# Patient Record
Sex: Female | Born: 1975 | Race: White | Hispanic: No | Smoking: Former smoker
Health system: Southern US, Community
[De-identification: ages and names within clinical notes are randomized; demographics above are authoritative.]

## PROBLEM LIST (undated history)

## (undated) DIAGNOSIS — J45909 Unspecified asthma, uncomplicated: Secondary | ICD-10-CM

## (undated) DIAGNOSIS — E559 Vitamin D deficiency, unspecified: Secondary | ICD-10-CM

## (undated) DIAGNOSIS — R5383 Other fatigue: Secondary | ICD-10-CM

## (undated) DIAGNOSIS — T7840XA Allergy, unspecified, initial encounter: Secondary | ICD-10-CM

## (undated) DIAGNOSIS — B977 Papillomavirus as the cause of diseases classified elsewhere: Secondary | ICD-10-CM

## (undated) DIAGNOSIS — R87629 Unspecified abnormal cytological findings in specimens from vagina: Secondary | ICD-10-CM

## (undated) DIAGNOSIS — K589 Irritable bowel syndrome without diarrhea: Secondary | ICD-10-CM

## (undated) DIAGNOSIS — F32A Depression, unspecified: Secondary | ICD-10-CM

## (undated) DIAGNOSIS — K219 Gastro-esophageal reflux disease without esophagitis: Secondary | ICD-10-CM

## (undated) DIAGNOSIS — F419 Anxiety disorder, unspecified: Secondary | ICD-10-CM

## (undated) DIAGNOSIS — G4733 Obstructive sleep apnea (adult) (pediatric): Secondary | ICD-10-CM

## (undated) DIAGNOSIS — E079 Disorder of thyroid, unspecified: Secondary | ICD-10-CM

## (undated) DIAGNOSIS — F3281 Premenstrual dysphoric disorder: Secondary | ICD-10-CM

## (undated) DIAGNOSIS — T1490XA Injury, unspecified, initial encounter: Secondary | ICD-10-CM

## (undated) DIAGNOSIS — B009 Herpesviral infection, unspecified: Secondary | ICD-10-CM

## (undated) DIAGNOSIS — E669 Obesity, unspecified: Secondary | ICD-10-CM

## (undated) DIAGNOSIS — M1990 Unspecified osteoarthritis, unspecified site: Secondary | ICD-10-CM

## (undated) DIAGNOSIS — N63 Unspecified lump in unspecified breast: Secondary | ICD-10-CM

## (undated) DIAGNOSIS — F329 Major depressive disorder, single episode, unspecified: Secondary | ICD-10-CM

## (undated) HISTORY — DX: Anxiety disorder, unspecified: F41.9

## (undated) HISTORY — DX: Major depressive disorder, single episode, unspecified: F32.9

## (undated) HISTORY — DX: Obstructive sleep apnea (adult) (pediatric): G47.33

## (undated) HISTORY — DX: Injury, unspecified, initial encounter: T14.90XA

## (undated) HISTORY — DX: Obesity, unspecified: E66.9

## (undated) HISTORY — DX: Depression, unspecified: F32.A

## (undated) HISTORY — DX: Papillomavirus as the cause of diseases classified elsewhere: B97.7

## (undated) HISTORY — DX: Vitamin D deficiency, unspecified: E55.9

## (undated) HISTORY — PX: WISDOM TOOTH EXTRACTION: SHX21

## (undated) HISTORY — DX: Unspecified asthma, uncomplicated: J45.909

## (undated) HISTORY — PX: CHOLECYSTECTOMY: SHX55

## (undated) HISTORY — DX: Unspecified osteoarthritis, unspecified site: M19.90

## (undated) HISTORY — PX: TONSILECTOMY/ADENOIDECTOMY WITH MYRINGOTOMY: SHX6125

## (undated) HISTORY — DX: Irritable bowel syndrome, unspecified: K58.9

## (undated) HISTORY — DX: Herpesviral infection, unspecified: B00.9

## (undated) HISTORY — DX: Other fatigue: R53.83

## (undated) HISTORY — DX: Unspecified lump in unspecified breast: N63.0

## (undated) HISTORY — DX: Disorder of thyroid, unspecified: E07.9

## (undated) HISTORY — DX: Allergy, unspecified, initial encounter: T78.40XA

## (undated) HISTORY — DX: Unspecified abnormal cytological findings in specimens from vagina: R87.629

---

## 2007-03-29 ENCOUNTER — Encounter (INDEPENDENT_AMBULATORY_CARE_PROVIDER_SITE_OTHER): Payer: Self-pay | Admitting: General Surgery

## 2007-03-29 ENCOUNTER — Ambulatory Visit (HOSPITAL_COMMUNITY): Admission: RE | Admit: 2007-03-29 | Discharge: 2007-03-29 | Payer: Self-pay | Admitting: General Surgery

## 2007-04-21 ENCOUNTER — Other Ambulatory Visit: Admission: RE | Admit: 2007-04-21 | Discharge: 2007-04-21 | Payer: Self-pay | Admitting: Obstetrics and Gynecology

## 2008-06-24 ENCOUNTER — Emergency Department (HOSPITAL_COMMUNITY): Admission: EM | Admit: 2008-06-24 | Discharge: 2008-06-24 | Payer: Self-pay | Admitting: Emergency Medicine

## 2008-11-23 ENCOUNTER — Ambulatory Visit: Payer: Self-pay | Admitting: Obstetrics and Gynecology

## 2008-11-23 ENCOUNTER — Other Ambulatory Visit: Admission: RE | Admit: 2008-11-23 | Discharge: 2008-11-23 | Payer: Self-pay | Admitting: Obstetrics and Gynecology

## 2008-11-23 ENCOUNTER — Encounter: Payer: Self-pay | Admitting: Obstetrics and Gynecology

## 2008-11-29 ENCOUNTER — Ambulatory Visit: Payer: Self-pay | Admitting: Obstetrics and Gynecology

## 2009-11-01 ENCOUNTER — Ambulatory Visit: Payer: Self-pay | Admitting: Gastroenterology

## 2009-11-01 DIAGNOSIS — E039 Hypothyroidism, unspecified: Secondary | ICD-10-CM | POA: Insufficient documentation

## 2009-11-01 DIAGNOSIS — F341 Dysthymic disorder: Secondary | ICD-10-CM | POA: Insufficient documentation

## 2009-11-01 DIAGNOSIS — F330 Major depressive disorder, recurrent, mild: Secondary | ICD-10-CM | POA: Insufficient documentation

## 2009-11-01 DIAGNOSIS — Z9109 Other allergy status, other than to drugs and biological substances: Secondary | ICD-10-CM | POA: Insufficient documentation

## 2009-11-01 DIAGNOSIS — K921 Melena: Secondary | ICD-10-CM | POA: Insufficient documentation

## 2009-11-01 DIAGNOSIS — Z91040 Latex allergy status: Secondary | ICD-10-CM | POA: Insufficient documentation

## 2009-11-01 DIAGNOSIS — F331 Major depressive disorder, recurrent, moderate: Secondary | ICD-10-CM | POA: Insufficient documentation

## 2009-11-01 DIAGNOSIS — J45909 Unspecified asthma, uncomplicated: Secondary | ICD-10-CM | POA: Insufficient documentation

## 2009-11-05 ENCOUNTER — Encounter: Payer: Self-pay | Admitting: Gastroenterology

## 2009-11-15 ENCOUNTER — Ambulatory Visit (HOSPITAL_COMMUNITY): Admission: RE | Admit: 2009-11-15 | Discharge: 2009-11-15 | Payer: Self-pay | Admitting: Gastroenterology

## 2009-11-15 ENCOUNTER — Ambulatory Visit: Payer: Self-pay | Admitting: Gastroenterology

## 2010-01-18 ENCOUNTER — Ambulatory Visit: Payer: Self-pay | Admitting: Gynecology

## 2010-08-26 ENCOUNTER — Ambulatory Visit (HOSPITAL_COMMUNITY): Admission: RE | Admit: 2010-08-26 | Discharge: 2010-08-26 | Payer: Self-pay | Admitting: Pediatrics

## 2010-10-14 ENCOUNTER — Emergency Department (HOSPITAL_COMMUNITY)
Admission: EM | Admit: 2010-10-14 | Discharge: 2010-10-14 | Payer: Self-pay | Source: Home / Self Care | Admitting: Emergency Medicine

## 2010-11-07 ENCOUNTER — Encounter
Admission: RE | Admit: 2010-11-07 | Discharge: 2010-11-07 | Payer: Self-pay | Source: Home / Self Care | Attending: Physical Medicine and Rehabilitation | Admitting: Physical Medicine and Rehabilitation
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
## 2010-11-12 NOTE — Letter (Signed)
Summary: TCS ORDER  TCS ORDER   Imported By: Diana Eves 11/05/2009 14:01:53  _____________________________________________________________________  External Attachment:    Type:   Image     Comment:   External Document

## 2011-01-01 LAB — BASIC METABOLIC PANEL
BUN: 5 mg/dL — ABNORMAL LOW (ref 6–23)
CO2: 29 mEq/L (ref 19–32)
Calcium: 9.2 mg/dL (ref 8.4–10.5)
Creatinine, Ser: 0.71 mg/dL (ref 0.4–1.2)
GFR calc non Af Amer: >60 mL/min (ref >60)
Glucose, Bld: 89 mg/dL (ref 70–99)

## 2011-01-01 LAB — HEMOGLOBIN AND HEMATOCRIT, BLOOD: HCT: 39.6 % (ref 36.0–46.0)

## 2011-02-25 NOTE — H&P (Signed)
NAME:  [name], [name]             ACCOUNT NO.:  [id number]   MEDICAL RECORD NO.:  [id number]           PATIENT TYPE:  AMB   LOCATION:                                FACILITY:  [facility]   PHYSICIAN:  Dalia Heading, M.D.  DATE OF BIRTH:  Aug 24, 1976   DATE OF ADMISSION:  DATE OF DISCHARGE:  LH                              HISTORY & PHYSICAL   CHIEF COMPLAINT:  Cholecystitis, cholelithiasis.   HISTORY OF PRESENT ILLNESS:  The patient is a 35 year old white female  who presents with biliary colic secondary to cholelithiasis.  She has  been having right upper quadrant abdominal pain, nausea, and  intermittent fatty food intolerance for the past few months.  No fever,  chills, jaundice have been noted.   PAST MEDICAL HISTORY:  1. Hypothyroidism.  2. Depression.  3. Anxiety.  4. Eczema.  5. Extrinsic allergies.   PAST SURGICAL HISTORY:  C-sections, tonsillectomy, adenoidectomy.   CURRENT MEDICATIONS:  1. Synthroid.  2. Effexor.  3. Lexapro.  4. Phentermine.  5. Yasmin.  6. Singulair.  7. Rhinocort.  8. Clarinex p.r.n.  9. Xopenex inhaler p.r.n.   ALLERGIES:  NAPROSYN.   REVIEW OF SYSTEMS:  The patient denies drinking or smoking.  She states  her breathing is currently normal.   PHYSICAL EXAMINATION:  GENERAL:  The patient is an obese white female in  no acute distress.  HEENT:  Examination reveals no scleral icterus.  LUNGS:  Clear to auscultation with equal breath sounds bilaterally.  HEART:  Examination reveals a regular rate and rhythm without S3, S4, or  murmurs.  ABDOMEN:  Soft and nondistended.  She is slightly tender in the right  upper quadrant to palpation.  No hepatosplenomegaly, masses or hernias  are identified.  Ultrasound of the gallbladder reveals cholelithiasis  with a normal common bile duct.   IMPRESSION:  Cholecystitis, cholelithiasis.   PLAN:  The patient is scheduled to undergo a laparoscopic  cholecystectomy on March 29, 2007.  The risks and  benefits of procedure  including bleeding, infection, hepatobiliary, the possibly of an open  procedure were fully explained to the patient, gave informed consent.      Dalia Heading, M.D.  Electronically Signed     MAJ/MEDQ  D:  03/23/2007  T:  03/24/2007  Job:  [id number]   cc:   Kirk Ruths, M.D.  Fax: [phone]

## 2011-02-25 NOTE — Op Note (Signed)
[name], [name]             ACCOUNT NO.:  [id number]   MEDICAL RECORD NO.:  [id number]          PATIENT TYPE:  AMB   LOCATION:  DAY                           FACILITY:  [facility]   PHYSICIAN:  Dalia Heading, M.D.  DATE OF BIRTH:  Jul 14, 1976   DATE OF PROCEDURE:  03/29/2007  DATE OF DISCHARGE:                               OPERATIVE REPORT   PREOPERATIVE DIAGNOSIS:  Cholecystitis, cholelithiasis.   POSTOPERATIVE DIAGNOSIS:  Cholecystitis, cholelithiasis.   PROCEDURE:  Laparoscopic cholecystectomy.   SURGEON:  Dalia Heading, M.D.   ANESTHESIA:  General endotracheal.   INDICATIONS:  The patient is a 35 year old white female who was referred  for evaluation and treatment of biliary colic secondary to  cholelithiasis.  The risks and benefits of the procedure including  bleeding, infection, hepatobiliary injury, and the possibility of an  open procedure were fully explained to the patient who gave informed  consent.   PROCEDURE NOTE:  The patient was placed in the supine position.  After  induction of general endotracheal anesthesia, the abdomen was prepped  and draped using the usual sterile technique with Betadine.  Surgical  site confirmation was performed.   A supraumbilical incision was made down to the fascia.  A Veress needle  was introduced into the abdominal cavity, and confirmation of placement  was done using the saline drop test.  The abdomen was then insufflated  to 16 mmHg pressure.  An 11-mm trocar was introduced into the abdominal  cavity under direct visualization without difficulty.  The patient was  placed in reversed Trendelenburg position, and an additional 11-mm  trocar was placed in the epigastric region and 5-mm trocars were placed  in the right upper quadrant and right flank regions.  The liver was  inspected and noted to be within normal limits.  The gallbladder was  retracted superiorly and laterally.  The dissection was begun around the  infundibulum of the gallbladder.  The cystic duct was first identified.  Its juncture to the infundibulum was fully identified.  Endoclips were  placed proximally and distally on the cystic duct, and the cystic duct  was divided.  This was likewise done on the cystic artery.  The  gallbladder then freed away from the gallbladder fossa using Bovie  electrocautery.  The gallbladder was delivered through the epigastric  trocar site using an EndoCatch bag.  The gallbladder fossa was  inspected.  No abnormal bleeding or bile leakage was noted.  Surgicel  was placed in the gallbladder fossa.  All fluid and air were then  evacuated from the abdominal cavity prior to removal of the trocars.   All wounds were irrigated with normal saline.  All wounds were injected  with 0.5% Sensorcaine.  The supraumbilical fascia was reapproximated  using an 0 Vicryl interrupted suture.  All skin incisions were closed  using staples.  Betadine ointment and dry sterile dressings were  applied.   All tape and needle counts were correct at the end of the procedure.  The patient was extubated in the operating room and went back to the  recovery room awake and  in stable condition.   COMPLICATIONS:  None.   SPECIMEN:  Gallbladder.   ESTIMATED BLOOD LOSS:  Minimal.      Dalia Heading, M.D.  Electronically Signed     MAJ/MEDQ  D:  03/29/2007  T:  03/29/2007  Job:  [id number]   cc:   Kirk Ruths, M.D.  Fax: [phone]

## 2011-07-31 LAB — CBC
HCT: 37.5
MCHC: 35.1
MCV: 85.7
Platelets: 358
WBC: 5.6

## 2011-07-31 LAB — BASIC METABOLIC PANEL
BUN: 9
CO2: 28
Chloride: 106
Creatinine, Ser: 0.7
Glucose, Bld: 96
Potassium: 4.3

## 2013-01-06 ENCOUNTER — Other Ambulatory Visit: Payer: Self-pay | Admitting: Obstetrics and Gynecology

## 2013-01-06 ENCOUNTER — Other Ambulatory Visit (HOSPITAL_COMMUNITY)
Admission: RE | Admit: 2013-01-06 | Discharge: 2013-01-06 | Disposition: A | Discharge status: Home / Self Care | Payer: Self-pay | Source: Ambulatory Visit | Attending: Obstetrics and Gynecology | Admitting: Obstetrics and Gynecology

## 2013-01-06 ENCOUNTER — Ambulatory Visit (INDEPENDENT_AMBULATORY_CARE_PROVIDER_SITE_OTHER): Payer: BC Managed Care – PPO | Admitting: Obstetrics and Gynecology

## 2013-01-06 ENCOUNTER — Encounter: Payer: Self-pay | Admitting: Obstetrics and Gynecology

## 2013-01-06 VITALS — BP 122/80 | Ht 64.0 in | Wt 374.4 lb

## 2013-01-06 DIAGNOSIS — Z113 Encounter for screening for infections with a predominantly sexual mode of transmission: Secondary | ICD-10-CM | POA: Insufficient documentation

## 2013-01-06 DIAGNOSIS — Z01419 Encounter for gynecological examination (general) (routine) without abnormal findings: Secondary | ICD-10-CM

## 2013-01-06 DIAGNOSIS — Z309 Encounter for contraceptive management, unspecified: Secondary | ICD-10-CM | POA: Insufficient documentation

## 2013-01-06 DIAGNOSIS — Z0000 Encounter for general adult medical examination without abnormal findings: Secondary | ICD-10-CM

## 2013-01-06 DIAGNOSIS — N390 Urinary tract infection, site not specified: Secondary | ICD-10-CM | POA: Insufficient documentation

## 2013-01-06 DIAGNOSIS — Z1151 Encounter for screening for human papillomavirus (HPV): Secondary | ICD-10-CM | POA: Insufficient documentation

## 2013-01-06 MED ORDER — SULFAMETHOXAZOLE-TRIMETHOPRIM 800-160 MG PO TABS: 1.0000 | ORAL_TABLET | Freq: Two times a day (BID) | ORAL | Status: DC

## 2013-01-06 MED ORDER — NORGESTIMATE-ETH ESTRADIOL 0.25-35 MG-MCG PO TABS: 1.0000 | ORAL_TABLET | Freq: Every day | ORAL | Status: DC

## 2013-01-06 MED ORDER — ACYCLOVIR 400 MG PO TABS: 400.0000 mg | ORAL_TABLET | Freq: Two times a day (BID) | ORAL | Status: DC

## 2013-01-06 NOTE — Addendum Note (Signed)
Addended by: Tilda Burrow on: 01/06/2013 12:18 PM   Modules accepted: Orders

## 2013-01-06 NOTE — Patient Instructions (Addendum)
  Place premenopausal annual exam patient instructions here.  Thank you for enrolling in MyChart. Please follow the instructions below to securely access your online medical record. MyChart allows you to send messages to your doctor, view your test results, manage appointments, and more.   How Do I Sign Up? 1. In your Internet browser, go to [employer] and enter [email or web address]. 2. Click on the Sign Up Now link in the Sign In box. You will see the New Member Sign Up page. 3. Enter your MyChart Access Code exactly as it appears below. You will not need to use this code after you've completed the sign-up process. If you do not sign up before the expiration date, you must request a new code. MyChart Access Code: [email or web address] Expires: 02/05/2013 12:09 PM  4. Enter your Social Security Number ([id number]) and Date of Birth (mm/dd/yyyy) as indicated and click Submit. You will be taken to the next sign-up page. 5. Create a MyChart ID. This will be your MyChart login ID and cannot be changed, so think of one that is secure and easy to remember. 6. Create a MyChart password. You can change your password at any time. 7. Enter your Password Reset Question and Answer. This can be used at a later time if you forget your password.  8. Enter your e-mail address. You will receive e-mail notification when new information is available in MyChart. 9. Click Sign Up. You can now view your medical record.   Additional Information Remember, MyChart is NOT to be used for urgent needs. For medical emergencies, dial 911.

## 2013-01-06 NOTE — Progress Notes (Signed)
  Subjective:     [name] is a 37 y.o. female here for a routine exam.  Current complaints: include still c freq uti's. Also had HSV-II dx  Only one vaginal outbreak +Fever blisters, had anal sore Desires supressant.  Personal health questionnaire reviewed: yes. Wants OCP.   Gynecologic History Patient's last menstrual period was 12/12/2012. Contraception: none desires ocp Last Pap: 2 yr. Results were: normal Last mammogram: n/a . Results were: n/a   Obstetric History OB History   Grav Para Term Preterm Abortions TAB SAB Ect Mult Living                   Std risk high.clamps,etc  Review of Systems Pertinent items are noted in HPI.    Objective:    BP 122/80  Ht 5\' 4"  (1.626 m)  Wt 374 lb 6.4 oz (169.827 kg)  BMI 64.23 kg/m2  LMP 12/12/2012  General Appearance:    Alert, cooperative, no distress, appears stated age  Head:    Normocephalic, without obvious abnormality, atraumatic  Eyes:    PERRL, conjunctiva/corneas clear, EOM's intact, fundi    benign, both eyes  Ears:    Normal TM's and external ear canals, both ears  Nose:   Nares normal, septum midline, mucosa normal, no drainage    or sinus tenderness  Throat:   Lips, mucosa, and tongue normal; teeth and gums normal  Neck:   Supple, symmetrical, trachea midline, no adenopathy;    thyroid:  no enlargement/tenderness/nodules; no carotid   bruit or JVD Breast pendulous , no lesions  Back:     Symmetric, no curvature, ROM normal, no CVA tenderness  Lungs:     Clear to auscultation bilaterally, respirations unlabored  Chest Wall:    No tenderness or deformity   Heart:    Regular rate and rhythm, S1 and S2 normal, no murmur, rub   or gallop  Breast Exam:    No tenderness, masses, or nipple abnormality  Abdomen:     Soft, non-tender, bowel sounds active all four quadrants,    no masses, no organomegaly  Genitalia:    Normal female without lesion, discharge or tenderness  Rectal:    l  Extremities:    Extremities normal, atraumatic, no cyanosis or edema  Pulses:   2+ and symmetric all extremities  Skin:   Skin color, texture, turgor normal, no rashes or lesions  Lymph nodes:   Cervical, supraclavicular, and axillary nodes normal  Neurologic:   CNII-XII intact, normal strength, sensation and reflexes    throughout      Assessment:  Std screen, uti , needs supression for hsvII  Healthy female exam.    Plan:    Contraception: OCP (estrogen/progesterone). sepra for uti, also std screen, pap

## 2013-01-07 LAB — HEPATITIS B SURFACE ANTIGEN: Hepatitis B Surface Ag: NEGATIVE

## 2013-01-11 ENCOUNTER — Telehealth: Payer: Self-pay | Admitting: Obstetrics and Gynecology

## 2013-01-11 NOTE — Telephone Encounter (Signed)
Pt informed of WNL result from 01/06/2013 (HIV, RPR, HEP B and C).

## 2013-01-30 ENCOUNTER — Encounter: Payer: Self-pay | Admitting: Obstetrics and Gynecology

## 2013-01-31 ENCOUNTER — Encounter: Payer: Self-pay | Admitting: *Deleted

## 2013-08-18 ENCOUNTER — Other Ambulatory Visit: Payer: Self-pay

## 2013-08-22 ENCOUNTER — Other Ambulatory Visit: Payer: BC Managed Care – PPO

## 2013-12-13 ENCOUNTER — Other Ambulatory Visit: Payer: Self-pay | Admitting: Obstetrics and Gynecology

## 2014-01-11 ENCOUNTER — Other Ambulatory Visit: Payer: Self-pay | Admitting: Obstetrics and Gynecology

## 2014-01-12 ENCOUNTER — Other Ambulatory Visit: Payer: BC Managed Care – PPO | Admitting: Obstetrics and Gynecology

## 2014-01-23 ENCOUNTER — Encounter: Payer: Self-pay | Admitting: Obstetrics and Gynecology

## 2014-01-23 ENCOUNTER — Ambulatory Visit (INDEPENDENT_AMBULATORY_CARE_PROVIDER_SITE_OTHER): Payer: BC Managed Care – PPO | Admitting: Obstetrics and Gynecology

## 2014-01-23 ENCOUNTER — Other Ambulatory Visit (HOSPITAL_COMMUNITY)
Admission: RE | Admit: 2014-01-23 | Discharge: 2014-01-23 | Disposition: A | Discharge status: Home / Self Care | Payer: BC Managed Care – PPO | Source: Ambulatory Visit | Attending: Obstetrics and Gynecology | Admitting: Obstetrics and Gynecology

## 2014-01-23 VITALS — BP 132/64 | Ht 65.0 in | Wt 382.8 lb

## 2014-01-23 DIAGNOSIS — Z1151 Encounter for screening for human papillomavirus (HPV): Secondary | ICD-10-CM | POA: Insufficient documentation

## 2014-01-23 DIAGNOSIS — Z01419 Encounter for gynecological examination (general) (routine) without abnormal findings: Secondary | ICD-10-CM

## 2014-01-23 DIAGNOSIS — Z124 Encounter for screening for malignant neoplasm of cervix: Secondary | ICD-10-CM | POA: Insufficient documentation

## 2014-01-23 DIAGNOSIS — E039 Hypothyroidism, unspecified: Secondary | ICD-10-CM

## 2014-01-23 DIAGNOSIS — Z3202 Encounter for pregnancy test, result negative: Secondary | ICD-10-CM

## 2014-01-23 DIAGNOSIS — R87810 Cervical high risk human papillomavirus (HPV) DNA test positive: Secondary | ICD-10-CM | POA: Insufficient documentation

## 2014-01-23 DIAGNOSIS — Z1212 Encounter for screening for malignant neoplasm of rectum: Secondary | ICD-10-CM

## 2014-01-23 LAB — POCT URINE PREGNANCY: PREG TEST UR: NEGATIVE

## 2014-01-23 MED ORDER — LEVOTHYROXINE SODIUM 175 MCG PO TABS: 175.0000 ug | ORAL_TABLET | Freq: Every day | ORAL | Status: DC

## 2014-01-23 NOTE — Patient Instructions (Signed)
Wt loss apps Lose it                      My fitness pal                     Eat Better

## 2014-01-23 NOTE — Progress Notes (Signed)
Patient ID: [name], female   DOB: 11-24-1975, 38 y.o.   MRN: [id number]  Assessment:  Annual Gyn Exam Morbid obesity Hypothyroid, managed by Dr Margo AyeHall    Plan:  1. pap smear done, next pap due 3 yr 2. return annually or prn rechk 1 month to discuss phentermine + wt loss strategies 3    Annual mammogram advised at 42 Subjective:  [name] is a 38 y.o. female No obstetric history on file. who presents for annual exam. Patient's last menstrual period was 01/09/2014. The patient has complaints today of breast nipple tenderness.  The following portions of the patient's history were reviewed and updated as appropriate: allergies, current medications, past family history, past medical history, past social history, past surgical history and problem list.  Review of Systems Constitutional: loses wt on phentermine, low energy at present. has access to pool this summer Gastrointestinal: negative Genitourinary:   Objective:  BP 132/64  Ht 5\' 5"  (1.651 m)  Wt 382 lb 12.8 oz (173.637 kg)  BMI 63.70 kg/m2  LMP 01/09/2014   BMI: Body mass index is 63.7 kg/(m^2).  General Appearance: Alert, appropriate appearance for age. No acute distress HEENT: Grossly normal Neck / Thyroid:  Cardiovascular: RRR; normal S1, S2, no murmur Lungs: CTA bilaterally Back: No CVAT Breast Exam: No dimpling, nipple retraction or discharge. No masses or nodes. and No masses or nodes.No dimpling, nipple retraction or discharge. Gastrointestinal: Soft, non-tender, no masses or organomegaly Pelvic Exam: Vulva and vagina appear normal. Bimanual exam reveals normal uterus and adnexa. Exam limited by body habitus Rectovaginal: not indicated Lymphatic Exam: Non-palpable nodes in neck, clavicular, axillary, or inguinal regions Skin: no rash or abnormalities Neurologic: Normal gait and speech, no tremor  Psychiatric: Alert and oriented, appropriate affect.  Urinalysis:Not done  Christin BachJohn Para Cossey. MD Pgr  [phone] 9:27 AM

## 2014-01-24 NOTE — Addendum Note (Signed)
Addended by: Gaylyn RongEVANS, Kishaun Erekson A on: 01/24/2014 08:41 AM   Modules accepted: Orders

## 2014-01-31 ENCOUNTER — Telehealth: Payer: Self-pay | Admitting: Obstetrics and Gynecology

## 2014-01-31 NOTE — Telephone Encounter (Signed)
Pt called, message left for pt to schdedule colposcopy

## 2014-01-31 NOTE — Telephone Encounter (Signed)
Message copied by Richardson ChiquitoRAVIS, ASHLEY M on Tue Jan 31, 2014 11:37 AM ------      Message from: Tilda BurrowFERGUSON, JOHN V      Created: Tue Jan 31, 2014 11:20 AM       Elnita Maxwellheryl need a colposcopy due to + ASCUS  With presence of + HPV hi risk virus.. I have left a message on pt's phone, and will place this note on MyChart. ------

## 2014-02-01 ENCOUNTER — Telehealth: Payer: Self-pay | Admitting: *Deleted

## 2014-02-01 NOTE — Telephone Encounter (Signed)
Pt informed of Abnormal pap from 01/23/2014 with +HPV, all questions answered. Colposcopy scheduled for 02/22/2014.

## 2014-02-14 ENCOUNTER — Other Ambulatory Visit: Payer: Self-pay | Admitting: Obstetrics and Gynecology

## 2014-02-14 NOTE — Telephone Encounter (Signed)
refil Acyclovir x 6 monts

## 2014-02-22 ENCOUNTER — Ambulatory Visit (INDEPENDENT_AMBULATORY_CARE_PROVIDER_SITE_OTHER): Payer: BC Managed Care – PPO | Admitting: Obstetrics and Gynecology

## 2014-02-22 ENCOUNTER — Encounter: Payer: Self-pay | Admitting: Obstetrics and Gynecology

## 2014-02-22 ENCOUNTER — Other Ambulatory Visit: Payer: Self-pay | Admitting: Obstetrics and Gynecology

## 2014-02-22 VITALS — BP 120/72 | Ht 65.0 in | Wt 384.0 lb

## 2014-02-22 DIAGNOSIS — Z3200 Encounter for pregnancy test, result unknown: Secondary | ICD-10-CM

## 2014-02-22 DIAGNOSIS — IMO0002 Reserved for concepts with insufficient information to code with codable children: Secondary | ICD-10-CM

## 2014-02-22 DIAGNOSIS — N870 Mild cervical dysplasia: Secondary | ICD-10-CM

## 2014-02-22 DIAGNOSIS — Z3202 Encounter for pregnancy test, result negative: Secondary | ICD-10-CM

## 2014-02-22 LAB — POCT URINE PREGNANCY: Preg Test, Ur: NEGATIVE

## 2014-02-22 NOTE — Progress Notes (Deleted)
This note was scribed for Christin BachJohn Ferguson, MD, by Bennett Scrapehristina Taylor, Medical Scribe on 02/22/14 at 9:00 AM. The information in this note was reviewed by Christin BachJohn Ferguson, MD, and is accurate.   Patient ID: Anita GallusCheryl L GERWOLDS, female   DOB: Mar 03, 1976, 38 y.o.   MRN: [id number]  HPI  Colposcopy Procedure Note  Indications: Pap smear 1 month ago showed: ASCUS with POSITIVE high risk HPV. The prior pap showed ASCUS with POSITIVE high risk HPV.  Prior cervical/vaginal disease: normal exam without visible pathology. Prior cervical treatment: no treatment.  Review of Systems  No complaints.  Physical Exam  Procedure Details  The risks and benefits of the procedure and Written informed consent obtained.  Speculum placed in vagina and excellent visualization of cervix achieved, cervix swabbed x 3 with acetic acid solution.  Findings: Cervix: small punctation noted at 12 o'clock L cell versus squamous metaplasia; cervical biopsies taken at 12 o'clock, ECC Vaginal inspection: vaginal colposcopy not performed. Vulvar colposcopy: vulvar colposcopy not performed.  Specimens: collected and labeled   Complications: none.  Plan: Specimens labelled and sent to Pathology. Post biopsy instructions given to patient. Will call to discuss Pathology results  PAP in one year

## 2014-02-22 NOTE — Patient Instructions (Signed)
Colposcopy, Care After  Refer to this sheet in the next few weeks. These instructions provide you with information on caring for yourself after your procedure. Your health care provider may also give you more specific instructions. Your treatment has been planned according to current medical practices, but problems sometimes occur. Call your health care provider if you have any problems or questions after your procedure.  WHAT TO EXPECT AFTER THE PROCEDURE   After your procedure, it is typical to have the following:  · Cramping. This often goes away in a few minutes.  · Soreness. This may last for 2 days.  · Lightheadedness. Lie down for a few minutes if this occurs.  You may also have some bleeding or dark discharge for a few days. You may need to wear a sanitary pad during this time.  HOME CARE INSTRUCTIONS  · Avoid sex, douching, and using tampons for 3 days or as directed by your health care provider.  · Only take over-the-counter or prescription medicines as directed by your health care provider. Do not take aspirin because it can cause bleeding.  · Continue to take birth control pills if you are on them.  · Not all test results are available during your visit. If your test results are not back during the visit, make an appointment with your health care provider to find out the results. Do not assume everything is normal if you have not heard from your health care provider or the medical facility. It is important for you to follow up on all of your test results.  · Follow your health care provider's advice regarding activity, follow-up visits, and follow-up Pap tests.  SEEK MEDICAL CARE IF:  · You develop a rash.  · You have problems with your medicine.  SEEK IMMEDIATE MEDICAL CARE IF:  · You are bleeding heavily or are passing blood clots.  · You have a fever.  · You have abnormal vaginal discharge.  · You are having cramps that do not go away after taking your pain medicine.  · You feel lightheaded, dizzy, or  faint.  · You have stomach pain.  Document Released: 07/20/2013 Document Reviewed: 04/28/2013  Exit[employer]® Patient Information ©2014 [employer].

## 2014-02-23 NOTE — Progress Notes (Signed)
This note was scribed for Christin BachJohn Tashara Suder, MD, by Bennett Scrapehristina Taylor, Medical Scribe on 02/22/14 at 9:00 AM. The information in this note was reviewed by Christin BachJohn Merleen Picazo, MD, and is accurate.   Patient ID: [name], female   DOB: 1975/10/16, 38 y.o.   MRN: [id number]  Colposcopy Procedure Note  Indications: Pap smear 1 month ago showed: ASCUS with POSITIVE high risk HPV. The prior pap showed ASCUS with POSITIVE high risk HPV.  Prior cervical/vaginal disease: normal exam without visible pathology. Prior cervical treatment: no treatment.  Procedure Details  The risks and benefits of the procedure and Written informed consent obtained.  Speculum placed in vagina and excellent visualization of cervix achieved, cervix swabbed x 3 with acetic acid solution.  Findings: Cervix: small punctation noted at 12 o'clock L cell versus squamous metaplasia; cervical biopsies taken at 12 o'clock, ECC Vaginal inspection: vaginal colposcopy not performed. Vulvar colposcopy: vulvar colposcopy not performed.  Specimens: collected and labeled   Complications: none.  Plan: Specimens labelled and sent to Pathology. Post biopsy instructions given to patient. Will call to discuss Pathology results  PAP in one year

## 2014-05-04 ENCOUNTER — Encounter: Payer: Self-pay | Admitting: Obstetrics and Gynecology

## 2014-05-04 ENCOUNTER — Ambulatory Visit (INDEPENDENT_AMBULATORY_CARE_PROVIDER_SITE_OTHER): Payer: BC Managed Care – PPO | Admitting: Obstetrics and Gynecology

## 2014-05-04 VITALS — BP 130/82 | Ht 65.0 in | Wt 391.0 lb

## 2014-05-04 DIAGNOSIS — E669 Obesity, unspecified: Secondary | ICD-10-CM | POA: Insufficient documentation

## 2014-05-04 DIAGNOSIS — F3281 Premenstrual dysphoric disorder: Secondary | ICD-10-CM

## 2014-05-04 DIAGNOSIS — N943 Premenstrual tension syndrome: Secondary | ICD-10-CM

## 2014-05-04 MED ORDER — SERTRALINE HCL 100 MG PO TABS: 100.0000 mg | ORAL_TABLET | Freq: Every day | ORAL | Status: DC

## 2014-05-04 NOTE — Progress Notes (Signed)
Patient ID: [name], female   DOB: 09-Sep-1976, 37 y.o.   MRN: [id number]  Chief Complaint  Patient presents with  . emotional issues during period  physical and emotional fatigue  HPI [name] is a 38 y.o. female. Weeklong premenstrual emotional volatility.can desire to be isolated, occasionally seeks attention , being "petted,and hair rubbed"     Works at day care. Stable relationship with 'fiancee'  Who is supporter Scientist, forensic. Pt  "adores " fiancee for his comforting acceptance of her mood changes. Pt not comfortable with the anger of premenstrual days.Pt loves her job. Pt felt bcp's Helped her when she was put on pills to have a les labile emotional response to stresses.  Hx anxiety and depression.    HPI  Past Medical History  Diagnosis Date  . Depression   . Anxiety   . Allergy   . Asthma   . Thyroid disease     hypothryoidism  . Herpes   . Vaginal Pap smear, abnormal     Past Surgical History  Procedure Laterality Date  . Cesarean section    . Cholecystectomy    . Tonsilectomy/adenoidectomy with myringotomy      Family History  Problem Relation Age of Onset  . Hypertension Mother   . Arthritis Mother   . Asthma Father   . COPD Father   . Arthritis Father   . Chronic bronchitis Father   . Hypertension Father   . Hyperlipidemia Father   . Drug abuse Brother   . Alcohol abuse Brother   . Asthma Brother   . Hypertension Brother   . Learning disabilities Brother   . Mental illness Brother   . Hearing loss Brother   . Cancer Maternal Aunt   . Cancer Maternal Uncle   . Cancer Paternal Aunt   . Cancer Paternal Uncle   . Asthma Maternal Grandmother   . Arthritis Maternal Grandmother   . Hypertension Maternal Grandmother   . Congestive Heart Failure Maternal Grandmother   . Diabetes Maternal Grandfather   . Stroke Maternal Grandfather   . ADD / ADHD Son   . Hyperlipidemia Brother     Social History History  Substance Use Topics  .  Smoking status: Never Smoker   . Smokeless tobacco: Never Used  . Alcohol Use: Yes     Comment: occ    Allergies  Allergen Reactions  . Latex   . Naproxen     Current Outpatient Prescriptions  Medication Sig Dispense Refill  . acyclovir (ZOVIRAX) 400 MG tablet TAKE ONE TABLET BY MOUTH TWICE DAILY  60 tablet  6  . ALPRAZolam (XANAX) 0.5 MG tablet       . Calcium Carbonate-Vitamin D 600-400 MG-UNIT per tablet Take 1 tablet by mouth daily.      Marland Kitchen ibuprofen (ADVIL,MOTRIN) 200 MG tablet Take 200 mg by mouth as needed.      . levalbuterol (XOPENEX HFA) 45 MCG/ACT inhaler Inhale into the lungs every 4 (four) hours as needed for wheezing.      Marland Kitchen levocetirizine (XYZAL) 5 MG tablet       . levothyroxine (SYNTHROID) 175 MCG tablet Take 1 tablet (175 mcg total) by mouth daily before breakfast.  30 tablet  0  . Misc Natural Products (CRANBERRY/PROBIOTIC PO) Take by mouth daily.      . montelukast (SINGULAIR) 10 MG tablet Take 10 mg by mouth at bedtime.      . sertraline (ZOLOFT) 25 MG tablet Take 100 mg by  mouth daily.       . SPRINTEC 28 0.25-35 MG-MCG tablet TAKE ONE TABLET BY MOUTH ONCE DAILY  28 tablet  11  . triamcinolone cream (KENALOG) 0.5 %        No current facility-administered medications for this visit.    Review of Systems Review of Systems Using treadmill x 15 mins /day,  at 1.5 mi/hr, sweating a lot by 15 mins.   Blood pressure 130/82, height 5\' 5"  (1.651 m), weight 391 lb (177.356 kg), last menstrual period 04/30/2014.  Physical Exam Physical Exam  Data Reviewed   Assessment    Morbid obesity, PMDD      Plan    Increase SSRI dosing x 2 wk before menses        [name] V 05/04/2014, 4:02 PM

## 2014-06-15 ENCOUNTER — Ambulatory Visit: Payer: BC Managed Care – PPO | Admitting: Obstetrics and Gynecology

## 2014-06-23 ENCOUNTER — Emergency Department (HOSPITAL_COMMUNITY)
Admission: EM | Admit: 2014-06-23 | Discharge: 2014-06-23 | Disposition: A | Discharge status: Home / Self Care | Payer: BC Managed Care – PPO | Attending: Emergency Medicine | Admitting: Emergency Medicine

## 2014-06-23 ENCOUNTER — Encounter (HOSPITAL_COMMUNITY): Payer: Self-pay | Admitting: Emergency Medicine

## 2014-06-23 ENCOUNTER — Emergency Department (HOSPITAL_COMMUNITY): Payer: BC Managed Care – PPO

## 2014-06-23 DIAGNOSIS — F329 Major depressive disorder, single episode, unspecified: Secondary | ICD-10-CM | POA: Insufficient documentation

## 2014-06-23 DIAGNOSIS — F411 Generalized anxiety disorder: Secondary | ICD-10-CM | POA: Diagnosis not present

## 2014-06-23 DIAGNOSIS — S90121A Contusion of right lesser toe(s) without damage to nail, initial encounter: Secondary | ICD-10-CM

## 2014-06-23 DIAGNOSIS — K219 Gastro-esophageal reflux disease without esophagitis: Secondary | ICD-10-CM | POA: Insufficient documentation

## 2014-06-23 DIAGNOSIS — Y939 Activity, unspecified: Secondary | ICD-10-CM | POA: Diagnosis not present

## 2014-06-23 DIAGNOSIS — Z79899 Other long term (current) drug therapy: Secondary | ICD-10-CM | POA: Diagnosis not present

## 2014-06-23 DIAGNOSIS — Z91040 Latex allergy status: Secondary | ICD-10-CM | POA: Insufficient documentation

## 2014-06-23 DIAGNOSIS — S99929A Unspecified injury of unspecified foot, initial encounter: Secondary | ICD-10-CM

## 2014-06-23 DIAGNOSIS — E039 Hypothyroidism, unspecified: Secondary | ICD-10-CM | POA: Diagnosis not present

## 2014-06-23 DIAGNOSIS — W208XXA Other cause of strike by thrown, projected or falling object, initial encounter: Secondary | ICD-10-CM | POA: Diagnosis not present

## 2014-06-23 DIAGNOSIS — J45909 Unspecified asthma, uncomplicated: Secondary | ICD-10-CM | POA: Diagnosis not present

## 2014-06-23 DIAGNOSIS — S8990XA Unspecified injury of unspecified lower leg, initial encounter: Secondary | ICD-10-CM | POA: Insufficient documentation

## 2014-06-23 DIAGNOSIS — F3289 Other specified depressive episodes: Secondary | ICD-10-CM | POA: Diagnosis not present

## 2014-06-23 DIAGNOSIS — Z8619 Personal history of other infectious and parasitic diseases: Secondary | ICD-10-CM | POA: Insufficient documentation

## 2014-06-23 DIAGNOSIS — S9030XA Contusion of unspecified foot, initial encounter: Secondary | ICD-10-CM | POA: Insufficient documentation

## 2014-06-23 DIAGNOSIS — S9031XA Contusion of right foot, initial encounter: Secondary | ICD-10-CM

## 2014-06-23 DIAGNOSIS — Y929 Unspecified place or not applicable: Secondary | ICD-10-CM | POA: Diagnosis not present

## 2014-06-23 DIAGNOSIS — S99919A Unspecified injury of unspecified ankle, initial encounter: Secondary | ICD-10-CM

## 2014-06-23 HISTORY — DX: Gastro-esophageal reflux disease without esophagitis: K21.9

## 2014-06-23 HISTORY — DX: Premenstrual dysphoric disorder: F32.81

## 2014-06-23 MED ORDER — OXYCODONE-ACETAMINOPHEN 5-325 MG PO TABS: 1.0000 | ORAL_TABLET | ORAL | Status: DC | PRN

## 2014-06-23 MED ORDER — OXYCODONE-ACETAMINOPHEN 5-325 MG PO TABS
1.0000 | ORAL_TABLET | Freq: Once | ORAL | Status: AC
  Administered 2014-06-23: 1 via ORAL
  Filled 2014-06-23: qty 1

## 2014-06-23 NOTE — Discharge Instructions (Signed)
Contusion A contusion is a deep bruise. Contusions happen when an injury causes bleeding under the skin. Signs of bruising include pain, puffiness (swelling), and discolored skin. The contusion may turn blue, purple, or yellow. HOME CARE   Put ice on the injured area.  Put ice in a plastic bag.  Place a towel between your skin and the bag.  Leave the ice on for 15-20 minutes, 03-04 times a day.  Only take medicine as told by your doctor.  Rest the injured area.  If possible, raise (elevate) the injured area to lessen puffiness. GET HELP RIGHT AWAY IF:   You have more bruising or puffiness.  You have pain that is getting worse.  Your puffiness or pain is not helped by medicine. MAKE SURE YOU:   Understand these instructions.  Will watch your condition.  Will get help right away if you are not doing well or get worse. Document Released: 03/17/2008 Document Revised: 12/22/2011 Document Reviewed: 08/04/2011 [facility] Patient Information 2015 [place], [place]. This information is not intended to replace advice given to you by your health care provider. Make sure you discuss any questions you have with your health care provider.  Buddy Taping of Toes We have taped your toes together to keep them from moving. This is called "buddy taping" since we used a part of your own body to keep the injured part still. We placed soft padding between your toes to keep them from rubbing against each other. Buddy taping will help with healing and to reduce pain. Keep your toes buddy taped together for as long as directed by your caregiver. HOME CARE INSTRUCTIONS   Raise your injured area above the level of your heart while sitting or lying down. Prop it up with pillows.  An ice pack used every twenty minutes, while awake, for the first one to two days may be helpful. Put ice in a plastic bag and put a towel between the bag and your skin.  Watch for signs that the taping is too tight. These signs  may be:  Numbness of your taped toes.  Coolness of your taped toes.  Color change in the area beyond the tape.  Increased pain.  If you have any of these signs, loosen or rewrap the tape. If you need to loosen or rewrap the buddy tape, make sure you use the padding again. SEEK IMMEDIATE MEDICAL CARE IF:   You have worse pain, swelling, inflammation (soreness), drainage or bleeding after you rewrap the tape.  Any new problems occur. MAKE SURE YOU:   Understand these instructions.  Will watch your condition.  Will get help right away if you are not doing well or get worse. Document Released: 07/03/2004 Document Revised: 12/22/2011 Document Reviewed: 09/26/2008 [facility] Patient Information 2015 [place], [place]. This information is not intended to replace advice given to you by your health care provider. Make sure you discuss any questions you have with your health care provider.

## 2014-06-23 NOTE — ED Notes (Signed)
Pt dropped industrial roll of paper on rt foot, co rt great tow pain, swelling noted.

## 2014-06-26 NOTE — ED Provider Notes (Signed)
Medical screening examination/treatment/procedure(s) were performed by non-physician practitioner and as supervising physician I was immediately available for consultation/collaboration.   EKG Interpretation None        Benny Lennert, MD 06/26/14 1134

## 2014-06-26 NOTE — ED Provider Notes (Signed)
CSN: [id number]     Arrival date & time 06/23/14  1719 History   First MD Initiated Contact with Patient 06/23/14 1753     Chief Complaint  Patient presents with  . Toe Injury    rt great toe     (Consider location/radiation/quality/duration/timing/severity/associated sxs/prior Treatment) HPI [name] is a 38 y.o. female who presents to the Emergency Department complaining of pain and swelling of her right great toe.  She states that a large industrial roll of paper fell onto her right foot.  She reports pain with walking and immediate swelling to her big toe.  She has not taken anything for pain.  She denies numbness, bleeding or pain proximal to the foot.     Past Medical History  Diagnosis Date  . Depression   . Anxiety   . Allergy   . Asthma   . Thyroid disease     hypothryoidism  . Herpes   . Vaginal Pap smear, abnormal   . GERD (gastroesophageal reflux disease)   . PMDD (premenstrual dysphoric disorder)    Past Surgical History  Procedure Laterality Date  . Cesarean section    . Cholecystectomy    . Tonsilectomy/adenoidectomy with myringotomy     Family History  Problem Relation Age of Onset  . Hypertension Mother   . Arthritis Mother   . Asthma Father   . COPD Father   . Arthritis Father   . Chronic bronchitis Father   . Hypertension Father   . Hyperlipidemia Father   . Drug abuse Brother   . Alcohol abuse Brother   . Asthma Brother   . Hypertension Brother   . Learning disabilities Brother   . Mental illness Brother   . Hearing loss Brother   . Cancer Maternal Aunt   . Cancer Maternal Uncle   . Cancer Paternal Aunt   . Cancer Paternal Uncle   . Asthma Maternal Grandmother   . Arthritis Maternal Grandmother   . Hypertension Maternal Grandmother   . Congestive Heart Failure Maternal Grandmother   . Diabetes Maternal Grandfather   . Stroke Maternal Grandfather   . ADD / ADHD Son   . Hyperlipidemia Brother    History  Substance Use Topics   . Smoking status: Never Smoker   . Smokeless tobacco: Never Used  . Alcohol Use: Yes     Comment: occ   OB History   Grav Para Term Preterm Abortions TAB SAB Ect Mult Living   Review of Systems  Constitutional: Negative for fever and chills.  Genitourinary: Negative for dysuria and difficulty urinating.  Musculoskeletal: Positive for arthralgias and joint swelling.  Skin: Negative for color change and wound.  All other systems reviewed and are negative.     Allergies  Latex and Naproxen  Home Medications   Prior to Admission medications   Medication Sig Start Date End Date Taking? Authorizing Provider  acyclovir (ZOVIRAX) 400 MG tablet TAKE ONE TABLET BY MOUTH TWICE DAILY 02/14/14   Tilda Burrow, MD  ALPRAZolam Prudy Feeler) 0.5 MG tablet  04/18/14   Historical Provider, MD  Calcium Carbonate-Vitamin D 600-400 MG-UNIT per tablet Take 1 tablet by mouth daily.    Historical Provider, MD  ibuprofen (ADVIL,MOTRIN) 200 MG tablet Take 200 mg by mouth as needed.    Historical Provider, MD  levalbuterol [facility] HFA) 45 MCG/ACT inhaler Inhale into the lungs every 4 (four) hours as needed  for wheezing.    Historical Provider, MD  levocetirizine (XYZAL) 5 MG tablet  01/05/14   Historical Provider, MD  levothyroxine (SYNTHROID) 175 MCG tablet Take 1 tablet (175 mcg total) by mouth daily before breakfast. 01/23/14   Tilda Burrow, MD  Misc Natural Products (CRANBERRY/PROBIOTIC PO) Take by mouth daily.    Historical Provider, MD  montelukast (SINGULAIR) 10 MG tablet Take 10 mg by mouth at bedtime.    Historical Provider, MD  oxyCODONE-acetaminophen (PERCOCET/ROXICET) 5-325 MG per tablet Take 1 tablet by mouth every 4 (four) hours as needed. 06/23/14   Taelyr Jantz L. Fayez Sturgell, PA-C  sertraline (ZOLOFT) 100 MG tablet Take 1 tablet (100 mg total) by mouth daily. Take 1 tab daily x 2weeks after menses, then increase to 2 tabs daily for 2 weeks leading in to menses 05/04/14   Tilda Burrow,  MD  SPRINTEC 28 0.25-35 MG-MCG tablet TAKE ONE TABLET BY MOUTH ONCE DAILY    Tilda Burrow, MD  triamcinolone cream (KENALOG) 0.5 %  03/29/14   Historical Provider, MD   BP 119/58  Pulse 56  Temp(Src) 97.8 F (36.6 C) (Oral)  Resp 18  Ht  (1.651 m)  Wt 387 lb (175.542 kg)  BMI 64.40 kg/m2  SpO2 100% Physical Exam  Nursing note and vitals reviewed. Constitutional: She is oriented to person, place, and time. She appears well-developed and well-nourished. No distress.  HENT:  Head: Normocephalic and atraumatic.  Cardiovascular: Normal rate, regular rhythm, normal heart sounds and intact distal pulses.   No murmur heard. Pulmonary/Chest: Effort normal and breath sounds normal. No respiratory distress.  Musculoskeletal: She exhibits tenderness.  ttp of the right great toe.  Mild bruising and edema.  DP pulse is brisk,distal sensation intact.  No erythema, abrasion, bruising or bony deformity.  No proximal tenderness.  Neurological: She is alert and oriented to person, place, and time. She exhibits normal muscle tone. Coordination normal.  Skin: Skin is warm and dry.    ED Course  Procedures (including critical care time) Labs Review Labs Reviewed - No data to display  Imaging Review Dg Foot Complete Right  06/23/2014   CLINICAL DATA:  Toe injury.  Pain in the region of the great toe.  EXAM: RIGHT FOOT COMPLETE - 3+ VIEW  COMPARISON:  None.  FINDINGS: There is no evidence of fracture or dislocation. Plantar calcaneal spur noted. Soft tissues are unremarkable. No radiopaque foreign body.  IMPRESSION: No acute bony abnormality identified.   Electronically Signed   By: Britta Mccreedy M.D.   On: 06/23/2014 19:05    EKG Interpretation None      MDM   Final diagnoses:  Contusion of foot including toes, right, initial encounter    Toes buddy taped and post op shoe applied,  Referral for Dr. Romeo Apple if needed.  Pt agrees to elevate, ice and rx for percocet.  She is ambulatory  with limp and appears stable for d/c    Terrin Imparato L. Bergen Magner, PA-C 06/26/14 0128

## 2014-07-03 ENCOUNTER — Ambulatory Visit: Payer: BC Managed Care – PPO | Admitting: Obstetrics and Gynecology

## 2014-08-14 ENCOUNTER — Encounter (HOSPITAL_COMMUNITY): Payer: Self-pay | Admitting: Emergency Medicine

## 2014-08-28 ENCOUNTER — Ambulatory Visit: Payer: BC Managed Care – PPO | Admitting: Obstetrics and Gynecology

## 2014-09-21 ENCOUNTER — Ambulatory Visit (INDEPENDENT_AMBULATORY_CARE_PROVIDER_SITE_OTHER): Payer: BC Managed Care – PPO | Admitting: Obstetrics and Gynecology

## 2014-09-21 ENCOUNTER — Encounter: Payer: Self-pay | Admitting: Obstetrics and Gynecology

## 2014-09-21 VITALS — BP 120/70 | Ht 65.0 in | Wt 398.5 lb

## 2014-09-21 DIAGNOSIS — F3281 Premenstrual dysphoric disorder: Secondary | ICD-10-CM

## 2014-09-21 DIAGNOSIS — Z6841 Body Mass Index (BMI) 40.0 and over, adult: Secondary | ICD-10-CM

## 2014-09-21 DIAGNOSIS — N943 Premenstrual tension syndrome: Secondary | ICD-10-CM

## 2014-09-21 MED ORDER — SERTRALINE HCL 100 MG PO TABS: 200.0000 mg | ORAL_TABLET | Freq: Every day | ORAL | Status: DC

## 2014-09-21 NOTE — Progress Notes (Signed)
Patient ID: [name], female   DOB: 22-Nov-1975, 38 y.o.   MRN: [id number]   [facility]  Patient name: [name] MRN [id number]  Date of birth: 22-Nov-1975  CC & HPI:  [name] is a 38 y.o. female presenting today for follow-up from her last visit in July.  At her last visit, her Zoloft dosage was increased and she states that it has helped with her moods.  However, her menses have not been irregular since the dosage increase.  She has also noticed increased sensitivity in her nipples that she states is worse during her menses.  She feels as though she is having a large hormone shift.  She has been walking on a treadmill almost every morning for at least 30 minutes and eating better but has not lost any weight.  She states that her knees, ankles, and hips have started hurting due to the weight.  She states that she feels that she does not get enough sleep and even when she goes to bed earlier, she does not feel well-rested in the morning.  She wakes up several times a night.  She has had a sleep study done in the past which did not reveal any signs of OSA.  She is going to see her PCP this weekend.  She gets her thyroid checked regularly at her PCP's office and states that her Synthroid is frequently increased.  ROS:  All systems have been reviewed and are otherwise negative unless indicated in the HPI.  Pertinent History Reviewed:   Reviewed: Significant for  Medical         Past Medical History  Diagnosis Date  . Depression   . Anxiety   . Allergy   . Asthma   . Thyroid disease     hypothryoidism  . Herpes   . Vaginal Pap smear, abnormal   . GERD (gastroesophageal reflux disease)   . PMDD (premenstrual dysphoric disorder)                               Surgical Hx:    Past Surgical History  Procedure Laterality Date  . Cesarean section    . Cholecystectomy    . Tonsilectomy/adenoidectomy with myringotomy     Medications: Reviewed & Updated  - see associated section                      Current outpatient prescriptions: acetaminophen (TYLENOL ARTHRITIS PAIN) 650 MG CR tablet, Take 650 mg by mouth every 8 (eight) hours as needed for pain., Disp: , Rfl: ;  acyclovir (ZOVIRAX) 400 MG tablet, TAKE ONE TABLET BY MOUTH TWICE DAILY, Disp: 60 tablet, Rfl: 6;  ALPRAZolam (XANAX) 0.5 MG tablet, , Disp: , Rfl: ;  Calcium Carbonate-Vitamin D 600-400 MG-UNIT per tablet, Take 1 tablet by mouth daily., Disp: , Rfl:  ibuprofen (ADVIL,MOTRIN) 200 MG tablet, Take 200 mg by mouth as needed., Disp: , Rfl: ;  levalbuterol (XOPENEX HFA) 45 MCG/ACT inhaler, Inhale into the lungs every 4 (four) hours as needed for wheezing., Disp: , Rfl: ;  levocetirizine (XYZAL) 5 MG tablet, , Disp: , Rfl: ;  levothyroxine (SYNTHROID) 175 MCG tablet, Take 1 tablet (175 mcg total) by mouth daily before breakfast., Disp: 30 tablet, Rfl: 0 Misc Natural Products (CRANBERRY/PROBIOTIC PO), Take by mouth daily., Disp: , Rfl: ;  montelukast (SINGULAIR) 10 MG tablet, Take 10 mg by  mouth at bedtime., Disp: , Rfl: ;  sertraline (ZOLOFT) 100 MG tablet, Take 1 tablet (100 mg total) by mouth daily. Take 1 tab daily x 2weeks after menses, then increase to 2 tabs daily for 2 weeks leading in to menses, Disp: 45 tablet, Rfl: 5 SPRINTEC 28 0.25-35 MG-MCG tablet, TAKE ONE TABLET BY MOUTH ONCE DAILY, Disp: 28 tablet, Rfl: 11;  triamcinolone cream (KENALOG) 0.5 %, , Disp: , Rfl:    Social History: Reviewed -  reports that she has never smoked. She has never used smokeless tobacco.  Objective Findings:  Vitals: Blood pressure 120/70, height 5\' 5"  (1.651 m), weight 398 lb 8 oz (180.758 kg), last menstrual period 09/19/2014. Weight is up from 391 in July Physical Examination: Discussion only.   Assessment & Plan:   A:  1, PMMD with good response to Zoloft increase 1. ?sleep apnea 2. Morbid obesity worsening 3 inadequate activity   P:  Increase zoloft to 200 qd all  month 1. Follow-up PRN 2  pt to be seen asap by primary care for TFT check and consider sleep studies again.  This chart was scribed for Tilda BurrowJohn Mitsuye Schrodt V, MD by Carl Bestelina Holson, ED Scribe. The patient's care was started at 9:39 AM.

## 2014-09-21 NOTE — Addendum Note (Signed)
Addended by: Tilda BurrowFERGUSON, Tavi Hoogendoorn V on: 09/21/2014 10:08 AM   Modules accepted: Orders

## 2014-12-14 ENCOUNTER — Other Ambulatory Visit: Payer: Self-pay | Admitting: Obstetrics and Gynecology

## 2014-12-15 NOTE — Telephone Encounter (Signed)
refil sprintec x 1 yr done

## 2015-01-02 ENCOUNTER — Ambulatory Visit (INDEPENDENT_AMBULATORY_CARE_PROVIDER_SITE_OTHER): Payer: BLUE CROSS/BLUE SHIELD | Admitting: Adult Health

## 2015-01-02 ENCOUNTER — Encounter: Payer: Self-pay | Admitting: Adult Health

## 2015-01-02 VITALS — BP 130/74 | HR 76 | Ht 65.0 in | Wt >= 6400 oz

## 2015-01-02 DIAGNOSIS — N63 Unspecified lump in unspecified breast: Secondary | ICD-10-CM

## 2015-01-02 HISTORY — DX: Unspecified lump in unspecified breast: N63.0

## 2015-01-02 NOTE — Progress Notes (Signed)
Subjective:     Patient ID: [name], female   DOB: 24-Dec-1975, 39 y.o.   MRN: [id number]  HPI [name] is a 39 year old white female in complaining of breast knot left breast x 1 week with some tenderness, no  nipple discharge.  Review of Systems +left breast knot with tenderness Reviewed past medical,surgical, social and family history. Reviewed medications and allergies.     Objective:   Physical Exam BP 130/74 mmHg  Pulse 76  Ht 5\' 5"  (1.651 m)  Wt 403 lb 6.4 oz (182.981 kg)  BMI 67.13 kg/m2  LMP 12/11/2014 (Approximate)    Skin warm and dry,  Breasts:no dominate palpable mass, retraction or nipple discharge on right, on left no nipple discharge or retraction but has 1-2 cm nodule at or under nipple that is tender and mobile, she has large breasts.No redness noted or skin changes,does have areas of healed folliculitis.  Discussed will get mammogram and US to evaluate.  Assessment:     Breast nodule,left    Plan:     [facility] bilateral [facility] and [facility] up prn Review handout on breast cyst

## 2015-01-02 NOTE — Patient Instructions (Signed)
Breast Cyst A breast cyst is a sac in the breast that is filled with fluid. Breast cysts are common in women. Women can have one or many cysts. When the breasts contain many cysts, it is usually due to a noncancerous (benign) condition called fibrocystic change. These lumps form under the influence of female hormones (estrogen and progesterone). The lumps are most often located in the upper, outer portion of the breast. They are often more swollen, painful, and tender before your period starts. They usually disappear after menopause, unless you are on hormone therapy.  There are several types of cysts:  Macrocyst. This is a cyst that is about 2 in. (5.1 cm) in diameter.   Microcyst. This is a tiny cyst that you cannot feel but can be seen with a mammogram or an ultrasound.   Galactocele. This is a cyst containing milk that may develop if you suddenly stop breastfeeding.   Sebaceous cyst of the skin. This type of cyst is not in the breast tissue itself. Breast cysts do not increase your risk of breast cancer. However, they must be monitored closely because they can be cancerous.  CAUSES  It is not known exactly what causes a breast cyst to form. Possible causes include:  An overgrowth of milk glands and connective tissue in the breast can block the milk glands, causing them to fill with fluid.   Scar tissue in the breast from previous surgery may block the glands, causing a cyst.  RISK FACTORS Estrogen may influence the development of a breast cyst.  SIGNS AND SYMPTOMS   Feeling a smooth, round, soft lump (like a grape) in the breast that is easily moveable.   Breast discomfort or pain.  Increase in size of the lump before your menstrual period and decrease in its size after your menstrual period.  DIAGNOSIS  A cyst can be felt during a physical exam by your health care provider. A breast X-ray exam (mammogram) and ultrasonography will be done to confirm the diagnosis. Fluid may  be removed from the cyst with a needle (fine needle aspiration) to make sure the cyst is not cancerous.  TREATMENT  Treatment may not be necessary. Your health care provider may monitor the cyst to see if it goes away on its own. If treatment is needed, it may include:  Hormone treatment.   Needle aspiration. There is a chance of the cyst coming back after aspiration.   Surgery to remove the whole cyst.  HOME CARE INSTRUCTIONS   Keep all follow-up appointments with your health care provider.  See your health care provider regularly:  Get a yearly exam by your health care provider.  Have a clinical breast exam by a health care provider every 1-3 years if you are 20-40 years of age. After age 40 years, you should have the exam every year.   Get mammogram tests as directed by your health care provider.   Understand the normal appearance and feel of your breasts and perform breast self-exams.   Only take over-the-counter or prescription medicines as directed by your health care provider.   Wear a supportive bra, especially when exercising.   Avoid caffeine.   Reduce your salt intake, especially before your menstrual period. Too much salt can cause fluid retention, breast swelling, and discomfort.  SEEK MEDICAL CARE IF:   You feel, or think you feel, a lump in your breast.   You notice that both breasts look or feel different than usual.   Your   breast is still causing pain after your menstrual period is over.   You need medicine for breast pain and swelling that occurs with your menstrual period.  SEEK IMMEDIATE MEDICAL CARE IF:   You have severe pain, tenderness, redness, or warmth in your breast.   You have nipple discharge or bleeding.   Your breast lump becomes hard and painful.   You find new lumps or bumps that were not there before.   You feel lumps in your armpit (axilla).   You notice dimpling or wrinkling of the breast or nipple.   You  have a fever.  MAKE SURE YOU:  Understand these instructions.  Will watch your condition.  Will get help right away if you are not doing well or get worse. Document Released: 09/29/2005 Document Revised: 06/01/2013 Document Reviewed: 04/28/2013 [facility] Patient Information 2015 [employer] [place]. This information is not intended to replace advice given to you by your health care provider. Make sure you discuss any questions you have with your health care provider. Get mammogram 3/29 at 1:30 pm Follow up prn

## 2015-01-09 ENCOUNTER — Ambulatory Visit (HOSPITAL_COMMUNITY): Payer: BLUE CROSS/BLUE SHIELD

## 2015-01-09 ENCOUNTER — Ambulatory Visit (HOSPITAL_COMMUNITY)
Admission: RE | Admit: 2015-01-09 | Discharge: 2015-01-09 | Disposition: A | Discharge status: Home / Self Care | Payer: BLUE CROSS/BLUE SHIELD | Source: Ambulatory Visit | Attending: Adult Health | Admitting: Adult Health

## 2015-01-09 DIAGNOSIS — N63 Unspecified lump in unspecified breast: Secondary | ICD-10-CM

## 2015-01-09 DIAGNOSIS — N644 Mastodynia: Secondary | ICD-10-CM | POA: Diagnosis not present

## 2015-03-19 ENCOUNTER — Other Ambulatory Visit: Payer: Self-pay | Admitting: Obstetrics and Gynecology

## 2015-03-20 ENCOUNTER — Telehealth: Payer: Self-pay | Admitting: Adult Health

## 2015-03-20 MED ORDER — ACYCLOVIR 400 MG PO TABS: 400.0000 mg | ORAL_TABLET | Freq: Two times a day (BID) | ORAL | Status: DC

## 2015-03-20 NOTE — Telephone Encounter (Signed)
Refilled acyclovir.

## 2015-03-20 NOTE — Telephone Encounter (Signed)
Spoke with pt. Pt is requesting a refill on Acyclovir. She takes it BID. Thanks!! JSY

## 2015-05-08 ENCOUNTER — Other Ambulatory Visit: Payer: Self-pay | Admitting: Obstetrics and Gynecology

## 2015-05-09 NOTE — Telephone Encounter (Signed)
refil x 3 mos, needs followup appt for med review

## 2015-09-27 ENCOUNTER — Other Ambulatory Visit: Payer: Self-pay | Admitting: Obstetrics and Gynecology

## 2015-09-28 ENCOUNTER — Telehealth: Payer: Self-pay | Admitting: Obstetrics and Gynecology

## 2015-09-28 NOTE — Telephone Encounter (Signed)
Pt states that her Rx had already been sent to Pharmacy.

## 2015-10-24 ENCOUNTER — Other Ambulatory Visit (HOSPITAL_COMMUNITY): Payer: Self-pay | Admitting: Respiratory Therapy

## 2015-10-24 DIAGNOSIS — R0683 Snoring: Secondary | ICD-10-CM

## 2015-10-24 DIAGNOSIS — G4730 Sleep apnea, unspecified: Secondary | ICD-10-CM

## 2016-01-25 ENCOUNTER — Ambulatory Visit: Payer: BLUE CROSS/BLUE SHIELD | Attending: Internal Medicine | Admitting: Sleep Medicine

## 2016-01-25 DIAGNOSIS — K219 Gastro-esophageal reflux disease without esophagitis: Secondary | ICD-10-CM | POA: Insufficient documentation

## 2016-01-25 DIAGNOSIS — F0390 Unspecified dementia without behavioral disturbance: Secondary | ICD-10-CM | POA: Diagnosis not present

## 2016-01-25 DIAGNOSIS — G4730 Sleep apnea, unspecified: Secondary | ICD-10-CM | POA: Diagnosis not present

## 2016-01-25 DIAGNOSIS — E782 Mixed hyperlipidemia: Secondary | ICD-10-CM | POA: Diagnosis not present

## 2016-01-25 DIAGNOSIS — G4733 Obstructive sleep apnea (adult) (pediatric): Secondary | ICD-10-CM | POA: Diagnosis not present

## 2016-01-25 DIAGNOSIS — E039 Hypothyroidism, unspecified: Secondary | ICD-10-CM | POA: Diagnosis not present

## 2016-01-25 DIAGNOSIS — R0683 Snoring: Secondary | ICD-10-CM

## 2016-02-02 NOTE — Sleep Study (Signed)
[facility] Karyssa Amaral A. Gerilyn Pilgrim, MD     [email or web address]             NOCTURNAL POLYSOMNOGRAPHY   LOCATION: [facility]  Patient Name: [name], [name] Date: 01/25/2016 Gender: Not Specified D.O.B: 03/19/1976 Age (years): 39 Referring Provider: Not Available Height (inches): 65 Interpreting Physician: Beryle Beams MD, ABSM Weight (lbs): 377 RPSGT: Peak, Robert BMI: 63 MRN: [id number] Neck Size: 14.50 CLINICAL INFORMATION Sleep Study Type: NPSG Indication for sleep study: Snoring Epworth Sleepiness Score: 13 SLEEP STUDY TECHNIQUE As per the AASM Manual for the Scoring of Sleep and Associated Events v2.3 (April 2016) with a hypopnea requiring 4% desaturations. The channels recorded and monitored were frontal, central and occipital EEG, electrooculogram (EOG), submentalis EMG (chin), nasal and oral airflow, thoracic and abdominal wall motion, anterior tibialis EMG, snore microphone, electrocardiogram, and pulse oximetry. MEDICATIONS Patient's medications include: N/A. Medications self-administered by patient during sleep study : No sleep medicine administered.  Current outpatient prescriptions:  .  acyclovir (ZOVIRAX) 400 MG tablet, TAKE ONE TABLET BY MOUTH TWICE DAILY, Disp: 60 tablet, Rfl: 6 .  acyclovir (ZOVIRAX) 400 MG tablet, Take 1 tablet (400 mg total) by mouth 2 (two) times daily., Disp: 60 tablet, Rfl: prn .  ALPRAZolam (XANAX) 0.5 MG tablet, 0.5 mg as needed. , Disp: , Rfl:  .  docusate sodium (COLACE) 50 MG capsule, Take 50 mg by mouth 2 (two) times daily. Takes 2 tab TID prn, Disp: , Rfl:  .  ibuprofen (ADVIL,MOTRIN) 200 MG tablet, Take 200 mg by mouth as needed., Disp: , Rfl:  .  levalbuterol (XOPENEX HFA) 45 MCG/ACT inhaler, Inhale into the lungs every 4 (four) hours as needed for wheezing., Disp: , Rfl:  .  levocetirizine (XYZAL) 5 MG tablet, 5 mg daily. , Disp: , Rfl:  .  levothyroxine (SYNTHROID, LEVOTHROID) 200 MCG tablet, Take 200 mcg by mouth  daily., Disp: , Rfl:  .  Misc Natural Products (CRANBERRY/PROBIOTIC PO), Take by mouth daily. Cranberry/Probiotic/Prebiotic Blend-2 daily, Disp: , Rfl:  .  montelukast (SINGULAIR) 10 MG tablet, Take 10 mg by mouth daily. , Disp: , Rfl:  .  Multiple Vitamins-Minerals (WOMENS MULTI VITAMIN & MINERAL PO), Take by mouth daily., Disp: , Rfl:  .  Ondansetron HCl (ZOFRAN PO), Take by mouth as needed., Disp: , Rfl:  .  sertraline (ZOLOFT) 100 MG tablet, TAKE TWO TABLETS BY MOUTH ONCE DAILY, Disp: 60 tablet, Rfl: 0 .  SPRINTEC 28 0.25-35 MG-MCG tablet, TAKE ONE TABLET BY MOUTH ONCE DAILY, Disp: 28 tablet, Rfl: 11 .  triamcinolone cream (KENALOG) 0.5 %, 1 application as needed. , Disp: , Rfl:   SLEEP ARCHITECTURE The study was initiated at 10:40:03 PM and ended at 5:01:39 AM. Sleep onset time was 56.6 minutes and the sleep efficiency was 76.6%. The total sleep time was 292.5 minutes. Stage REM latency was 273.5 minutes. The patient spent 3.08% of the night in stage N1 sleep, 71.28% in stage N2 sleep, 17.10% in stage N3 and 8.55% in REM. Alpha intrusion was absent. Supine sleep was 0.00%. RESPIRATORY PARAMETERS The overall apnea/hypopnea index (AHI) was 9.2 per hour. There were 3 total apneas, including 3 obstructive, 0 central and 0 mixed apneas. There were 42 hypopneas and 0 RERAs. The AHI during Stage REM sleep was 21.6 per hour. AHI while supine was N/A per hour. The mean oxygen saturation was 92.63%. The minimum SpO2 during sleep was 82.00%. Loud snoring was noted during this study. CARDIAC DATA The 2 lead EKG demonstrated sinus  rhythm. The mean heart rate was 73.94 beats per minute. Other EKG findings include: None. LEG MOVEMENT DATA The total PLMS were 0 with a resulting PLMS index of 0.00. Associated arousal with leg movement index was 0.0.    IMPRESSIONS - Mild obstructive sleep apnea not requiring positive pressure treatment.   Argie RammingKofi A Micharl Helmes, MD Diplomate, American Board of Sleep  Medicine.

## 2016-02-15 DIAGNOSIS — Z6841 Body Mass Index (BMI) 40.0 and over, adult: Secondary | ICD-10-CM | POA: Diagnosis not present

## 2016-02-15 DIAGNOSIS — R5383 Other fatigue: Secondary | ICD-10-CM | POA: Diagnosis not present

## 2016-02-15 DIAGNOSIS — E669 Obesity, unspecified: Secondary | ICD-10-CM | POA: Diagnosis not present

## 2016-04-07 DIAGNOSIS — E782 Mixed hyperlipidemia: Secondary | ICD-10-CM | POA: Diagnosis not present

## 2016-04-07 DIAGNOSIS — E039 Hypothyroidism, unspecified: Secondary | ICD-10-CM | POA: Diagnosis not present

## 2016-04-14 ENCOUNTER — Encounter: Payer: Self-pay | Admitting: Obstetrics and Gynecology

## 2016-04-14 ENCOUNTER — Ambulatory Visit (INDEPENDENT_AMBULATORY_CARE_PROVIDER_SITE_OTHER): Payer: BLUE CROSS/BLUE SHIELD | Admitting: Obstetrics and Gynecology

## 2016-04-14 ENCOUNTER — Other Ambulatory Visit (HOSPITAL_COMMUNITY)
Admission: RE | Admit: 2016-04-14 | Discharge: 2016-04-14 | Disposition: A | Discharge status: Home / Self Care | Payer: BLUE CROSS/BLUE SHIELD | Source: Ambulatory Visit | Attending: Obstetrics and Gynecology | Admitting: Obstetrics and Gynecology

## 2016-04-14 VITALS — BP 120/80 | Ht 65.0 in | Wt 331.0 lb

## 2016-04-14 DIAGNOSIS — Z01419 Encounter for gynecological examination (general) (routine) without abnormal findings: Secondary | ICD-10-CM | POA: Diagnosis not present

## 2016-04-14 DIAGNOSIS — E782 Mixed hyperlipidemia: Secondary | ICD-10-CM | POA: Diagnosis not present

## 2016-04-14 DIAGNOSIS — Z1151 Encounter for screening for human papillomavirus (HPV): Secondary | ICD-10-CM | POA: Insufficient documentation

## 2016-04-14 DIAGNOSIS — E039 Hypothyroidism, unspecified: Secondary | ICD-10-CM | POA: Diagnosis not present

## 2016-04-14 NOTE — Progress Notes (Signed)
Patient ID: Anita BustleCheryl L [name], female   DOB: 1976/03/12, 40 y.o.   MRN: [id number]   Assessment:  Annual Gyn Exam Morbid obesity with recent weight loss Hypothyroidism Hx Fibromyalgia.   Plan:  1. Pap smear done, next pap due 3 years 2. return annually or prn 3    Annual mammogram advised starting age 240-45  Subjective:  [name] is a 40 y.o. female G2P2002 who presents for annual exam. Patient's last menstrual period was 04/07/2016. The patient has no complaints today. Pt notes some skin irritations on her breasts, worse after sweating during exercise. Pt has been exercising regularly and has lost ~75 pounds in the past 6 months. She notes chronic fatigue 2 years ago, greatly improved after starting phentermine. Pt reports her PCP is managing her thyroid medications.Currently still TSH elevated slightly above normal and being adjusted gradually by PCP The following portions of the patient's history were reviewed and updated as appropriate: allergies, current medications, past family history, past medical history, past social history, past surgical history and problem list.  Past Medical History  Diagnosis Date  . Depression   . Anxiety   . Allergy   . Asthma   . Thyroid disease     hypothryoidism  . Herpes   . Vaginal Pap smear, abnormal   . GERD (gastroesophageal reflux disease)   . PMDD (premenstrual dysphoric disorder)   . Obesity   . Breast nodule 01/02/2015  . Fatigue   . HPV in female     Past Surgical History  Procedure Laterality Date  . Cesarean section    . Cholecystectomy    . Tonsilectomy/adenoidectomy with myringotomy       Current outpatient prescriptions:  .  ALPRAZolam (XANAX) 0.5 MG tablet, 0.5 mg as needed. , Disp: , Rfl:  .  diclofenac (CATAFLAM) 50 MG tablet, Take 50 mg by mouth as needed., Disp: , Rfl:  .  docusate sodium (COLACE) 50 MG capsule, Take 50 mg by mouth 2 (two) times daily. Takes 2 tab TID prn, Disp: , Rfl:  .  fexofenadine  (ALLEGRA) 180 MG tablet, Take 180 mg by mouth daily., Disp: , Rfl:  .  levalbuterol (XOPENEX HFA) 45 MCG/ACT inhaler, Inhale into the lungs every 4 (four) hours as needed for wheezing., Disp: , Rfl:  .  levothyroxine (SYNTHROID, LEVOTHROID) 200 MCG tablet, Take 200 mcg by mouth daily., Disp: , Rfl:  .  Misc Natural Products (CRANBERRY/PROBIOTIC PO), Take by mouth daily. Cranberry/Probiotic/Prebiotic Blend-2 daily, Disp: , Rfl:  .  montelukast (SINGULAIR) 10 MG tablet, Take 10 mg by mouth daily. , Disp: , Rfl:  .  Multiple Vitamins-Minerals (WOMENS MULTI VITAMIN & MINERAL PO), Take by mouth daily., Disp: , Rfl:  .  phentermine 37.5 MG capsule, Take 37.5 mg by mouth every morning., Disp: , Rfl:  .  sertraline (ZOLOFT) 100 MG tablet, TAKE TWO TABLETS BY MOUTH ONCE DAILY, Disp: 60 tablet, Rfl: 0 .  triamcinolone cream (KENALOG) 0.5 %, 1 application as needed. , Disp: , Rfl:   Review of Systems Constitutional: negative Gastrointestinal: negative Genitourinary: negative  Objective:  BP 120/80 mmHg  Ht 5\' 5"  (1.651 m)  Wt 331 lb (150.141 kg)  BMI 55.08 kg/m2  LMP 04/07/2016   BMI: Body mass index is 55.08 kg/(m^2).  General Appearance: Alert, appropriate appearance for age. No acute distress HEENT: Grossly normal Neck / Thyroid:  Cardiovascular: RRR; normal S1, S2, no murmur Lungs: CTA bilaterally Back: No CVAT Breast Exam: No dimpling, nipple retraction  or discharge. No masses or nodes.  Gastrointestinal: Soft, non-tender, no masses or organomegaly Pelvic Exam:  External genitalia: normal general appearance Urinary system: urethral meatus normal Vaginal: normal mucosa without prolapse or lesions Cervix: normal appearance Adnexa: normal bimanual exam Uterus: normal single, nontender Lymphatic Exam: Non-palpable nodes in neck, clavicular, axillary, or inguinal regions  Skin: no rash or abnormalities Neurologic: Normal gait and speech, no tremor  Psychiatric: Alert and oriented,  appropriate affect.  Urinalysis: Not done  [name]. MD Pgr [phone] 3:39 PM   By signing my name below, I, Marisue HumbleMichelle Chaffee, attest that this documentation has been prepared under the direction and in the presence of Tilda BurrowJohn V Keshawna Dix, MD . Electronically Signed: Marisue HumbleMichelle Chaffee, Scribe. 04/14/2016. 3:39 PM.  I personally performed the services described in this documentation, which was SCRIBED in my presence. The recorded information has been reviewed and considered accurate. It has been edited as necessary during review. Tilda BurrowFERGUSON,Veva Grimley V, MD

## 2016-04-14 NOTE — Progress Notes (Signed)
Patient ID: [name], female   DOB: [birth year], 40 y.o.   MRN: [id number] Pt here today for her annual exam. Pt states that things have gotten a little better since she has come off the BCP and Acyclovir.

## 2016-04-18 LAB — CYTOLOGY - PAP

## 2016-10-20 DIAGNOSIS — E039 Hypothyroidism, unspecified: Secondary | ICD-10-CM | POA: Diagnosis not present

## 2016-10-20 DIAGNOSIS — E782 Mixed hyperlipidemia: Secondary | ICD-10-CM | POA: Diagnosis not present

## 2016-10-27 DIAGNOSIS — E039 Hypothyroidism, unspecified: Secondary | ICD-10-CM | POA: Diagnosis not present

## 2016-10-27 DIAGNOSIS — E782 Mixed hyperlipidemia: Secondary | ICD-10-CM | POA: Diagnosis not present

## 2016-10-27 DIAGNOSIS — Z0001 Encounter for general adult medical examination with abnormal findings: Secondary | ICD-10-CM | POA: Diagnosis not present

## 2016-12-15 DIAGNOSIS — M25572 Pain in left ankle and joints of left foot: Secondary | ICD-10-CM | POA: Diagnosis not present

## 2016-12-15 DIAGNOSIS — M25511 Pain in right shoulder: Secondary | ICD-10-CM | POA: Diagnosis not present

## 2017-01-01 ENCOUNTER — Ambulatory Visit: Payer: BLUE CROSS/BLUE SHIELD | Admitting: Orthopaedic Surgery

## 2017-04-20 DIAGNOSIS — E039 Hypothyroidism, unspecified: Secondary | ICD-10-CM | POA: Diagnosis not present

## 2017-04-20 DIAGNOSIS — E782 Mixed hyperlipidemia: Secondary | ICD-10-CM | POA: Diagnosis not present

## 2017-04-27 DIAGNOSIS — E039 Hypothyroidism, unspecified: Secondary | ICD-10-CM | POA: Diagnosis not present

## 2017-04-27 DIAGNOSIS — F411 Generalized anxiety disorder: Secondary | ICD-10-CM | POA: Diagnosis not present

## 2017-04-27 DIAGNOSIS — E782 Mixed hyperlipidemia: Secondary | ICD-10-CM | POA: Diagnosis not present

## 2017-05-12 ENCOUNTER — Encounter: Payer: Self-pay | Admitting: Orthopaedic Surgery

## 2017-05-12 ENCOUNTER — Ambulatory Visit (INDEPENDENT_AMBULATORY_CARE_PROVIDER_SITE_OTHER): Payer: BLUE CROSS/BLUE SHIELD | Admitting: Orthopaedic Surgery

## 2017-05-12 ENCOUNTER — Ambulatory Visit (INDEPENDENT_AMBULATORY_CARE_PROVIDER_SITE_OTHER): Payer: BLUE CROSS/BLUE SHIELD

## 2017-05-12 VITALS — BP 128/79 | HR 77 | Temp 97.8°F | Ht 64.5 in | Wt 345.0 lb

## 2017-05-12 DIAGNOSIS — M25511 Pain in right shoulder: Secondary | ICD-10-CM

## 2017-05-12 DIAGNOSIS — G8929 Other chronic pain: Secondary | ICD-10-CM

## 2017-05-12 NOTE — Progress Notes (Signed)
Subjective:    Patient ID: [name], female    DOB: 06/24/1976, [age] y.o.   MRN: [id number]  HPI She hurt her right shoulder in yoga class about eight to nine months ago.  She has no fall.  It hurts most of the time now.  She has pain overhead use and in extension.  She has no swelling, no redness. She has tried heat, ice, rubs, Advil, exercises with no help.  She has seen Dr. Dwana MelenaZack Hall about it.  She is tired of hurting in the shoulder.  She has no numbness, no redness.   Review of Systems  HENT: Negative for congestion.   Respiratory: Positive for shortness of breath. Negative for cough.   Cardiovascular: Negative for chest pain and leg swelling.  Endocrine: Positive for cold intolerance.  Musculoskeletal: Positive for arthralgias.  Allergic/Immunologic: Positive for environmental allergies.   Past Medical History:  Diagnosis Date  . Allergy   . Anxiety   . Arthritis   . Asthma   . Breast nodule 01/02/2015  . Depression   . Fatigue   . GERD (gastroesophageal reflux disease)   . Herpes   . HPV in female   . Obesity   . PMDD (premenstrual dysphoric disorder)   . Thyroid disease    hypothryoidism  . Vaginal Pap smear, abnormal     Past Surgical History:  Procedure Laterality Date  . CESAREAN SECTION    . CHOLECYSTECTOMY    . TONSILECTOMY/ADENOIDECTOMY WITH MYRINGOTOMY    . WISDOM TOOTH EXTRACTION      Current Outpatient Prescriptions on File Prior to Visit  Medication Sig Dispense Refill  . ALPRAZolam (XANAX) 0.5 MG tablet 0.5 mg as needed.     . diclofenac (CATAFLAM) 50 MG tablet Take 50 mg by mouth as needed.    . fexofenadine (ALLEGRA) 180 MG tablet Take 180 mg by mouth daily.    Marland Kitchen. levalbuterol (XOPENEX HFA) 45 MCG/ACT inhaler Inhale into the lungs every 4 (four) hours as needed for wheezing.    . Misc Natural Products (CRANBERRY/PROBIOTIC PO) Take by mouth daily. Cranberry/Probiotic/Prebiotic Blend-2 daily    . montelukast (SINGULAIR) 10 MG tablet Take 10  mg by mouth daily.     . Multiple Vitamins-Minerals (WOMENS MULTI VITAMIN & MINERAL PO) Take by mouth daily.    . phentermine 37.5 MG capsule Take 37.5 mg by mouth every morning.    . sertraline (ZOLOFT) 100 MG tablet TAKE TWO TABLETS BY MOUTH ONCE DAILY 60 tablet 0  . docusate sodium (COLACE) 50 MG capsule Take 50 mg by mouth 2 (two) times daily. Takes 2 tab TID prn    . levothyroxine (SYNTHROID, LEVOTHROID) 200 MCG tablet Take 200 mcg by mouth daily.    Marland Kitchen. triamcinolone cream (KENALOG) 0.5 % 1 application as needed.      No current facility-administered medications on file prior to visit.     Social History   Social History  . Marital status: Legally Separated    Spouse name: N/A  . Number of children: N/A  . Years of education: N/A   Occupational History  . Not on file.   Social History Main Topics  . Smoking status: Never Smoker  . Smokeless tobacco: Never Used  . Alcohol use Yes     Comment: occ  . Drug use: No  . Sexual activity: Yes    Birth control/ protection: None   Other Topics Concern  . Not on file   Social History Narrative  .  No narrative on file    Family History  Problem Relation Age of Onset  . Hypertension Mother   . Arthritis Mother   . Asthma Father   . COPD Father   . Arthritis Father   . Chronic bronchitis Father   . Hypertension Father   . Hyperlipidemia Father   . Drug abuse Brother   . Alcohol abuse Brother   . Hypertension Brother   . Mental illness Brother   . Depression Brother   . Cancer Maternal Aunt   . Cancer Maternal Uncle   . Cancer Paternal Aunt   . Cancer Paternal Uncle   . Asthma Maternal Grandmother   . Arthritis Maternal Grandmother   . Hypertension Maternal Grandmother   . Congestive Heart Failure Maternal Grandmother   . Diabetes Maternal Grandfather   . Stroke Maternal Grandfather   . ADD / ADHD Son   . Hyperlipidemia Brother   . Hearing loss Brother   . Asthma Brother   . Learning disabilities Brother      BP 128/79   Pulse 77   Temp 97.8 F (36.6 C)   Ht 5' 4.5" (1.638 m)   Wt (!) 345 lb (156.5 kg)   BMI 58.30 kg/m      Objective:   Physical Exam  Constitutional: She is oriented to person, place, and time. She appears well-developed and well-nourished.  HENT:  Head: Normocephalic and atraumatic.  Eyes: Pupils are equal, round, and reactive to light. Conjunctivae and EOM are normal.  Neck: Normal range of motion. Neck supple.  Cardiovascular: Normal rate, regular rhythm and intact distal pulses.   Pulmonary/Chest: Effort normal.  Abdominal: Soft.  Musculoskeletal: She exhibits tenderness (Right shoulder with full motion but painful more with full extension and full flexion.  NV intact. Neck has full motion.  Left shoulder not painful.  Grips normal.).  Neurological: She is alert and oriented to person, place, and time. She displays normal reflexes. No cranial nerve deficit. She exhibits normal muscle tone. Coordination normal.  Skin: Skin is warm and dry.  Psychiatric: She has a normal mood and affect. Her behavior is normal. Judgment and thought content normal.  Vitals reviewed.   X-rays of the right shoulder were done, reported separately.      Assessment & Plan:   Encounter Diagnosis  Name Primary?  . Chronic right shoulder pain Yes   PROCEDURE NOTE:  The patient request injection, verbal consent was obtained.  The right shoulder was prepped appropriately after time out was performed.   Sterile technique was observed and injection of 1 cc of Depo-Medrol 40 mg with several cc's of plain xylocaine. Anesthesia was provided by ethyl chloride and a 20-gauge needle was used to inject the shoulder area. A posterior approach was used.  The injection was tolerated well.  A band aid dressing was applied.  The patient was advised to apply ice later today and tomorrow to the injection sight as needed.  She cannot take Naprosyn and limited on ibuprofen.  Return in two  weeks.  Consider MRI and PT if not improved.  Call if any problem.  Precautions discussed.   Electronically Signed Darreld McleanWayne Aren Cherne, MD 7/31/20183:50 PM

## 2017-05-27 ENCOUNTER — Encounter: Payer: Self-pay | Admitting: Orthopaedic Surgery

## 2017-05-27 ENCOUNTER — Ambulatory Visit (INDEPENDENT_AMBULATORY_CARE_PROVIDER_SITE_OTHER): Payer: BLUE CROSS/BLUE SHIELD | Admitting: Orthopaedic Surgery

## 2017-05-27 VITALS — BP 128/87 | HR 77 | Temp 97.2°F | Ht 65.0 in | Wt 346.0 lb

## 2017-05-27 DIAGNOSIS — M25511 Pain in right shoulder: Secondary | ICD-10-CM | POA: Diagnosis not present

## 2017-05-27 DIAGNOSIS — G8929 Other chronic pain: Secondary | ICD-10-CM

## 2017-05-27 NOTE — Progress Notes (Signed)
Patient ZO:XWRUEA:[name], female DOB:1976-09-19, 41 y.o. VWU:[id number]  Chief Complaint  Patient presents with  . Follow-up    rt shoulder    HPI  Anita BustleCheryl L Bouillon is a 41 y.o. female who has had right shoulder pain.  I gave her an injection last visit. She is significantly improved and has no pain in the right shoulder now.She has full motion, no pain. She is very pleased. HPI  Body mass index is 57.58 kg/m.  ROS  Review of Systems  HENT: Negative for congestion.   Respiratory: Positive for shortness of breath. Negative for cough.   Cardiovascular: Negative for chest pain and leg swelling.  Endocrine: Positive for cold intolerance.  Musculoskeletal: Positive for arthralgias.  Allergic/Immunologic: Positive for environmental allergies.    Past Medical History:  Diagnosis Date  . Allergy   . Anxiety   . Arthritis   . Asthma   . Breast nodule 01/02/2015  . Depression   . Fatigue   . GERD (gastroesophageal reflux disease)   . Herpes   . HPV in female   . Obesity   . PMDD (premenstrual dysphoric disorder)   . Thyroid disease    hypothryoidism  . Vaginal Pap smear, abnormal     Past Surgical History:  Procedure Laterality Date  . CESAREAN SECTION    . CHOLECYSTECTOMY    . TONSILECTOMY/ADENOIDECTOMY WITH MYRINGOTOMY    . WISDOM TOOTH EXTRACTION      Family History  Problem Relation Age of Onset  . Hypertension Mother   . Arthritis Mother   . Asthma Father   . COPD Father   . Arthritis Father   . Chronic bronchitis Father   . Hypertension Father   . Hyperlipidemia Father   . Drug abuse Brother   . Alcohol abuse Brother   . Hypertension Brother   . Mental illness Brother   . Depression Brother   . Cancer Maternal Aunt   . Cancer Maternal Uncle   . Cancer Paternal Aunt   . Cancer Paternal Uncle   . Asthma Maternal Grandmother   . Arthritis Maternal Grandmother   . Hypertension Maternal Grandmother   . Congestive Heart Failure Maternal Grandmother    . Diabetes Maternal Grandfather   . Stroke Maternal Grandfather   . ADD / ADHD Son   . Hyperlipidemia Brother   . Hearing loss Brother   . Asthma Brother   . Learning disabilities Brother     Social History Social History  Substance Use Topics  . Smoking status: Never Smoker  . Smokeless tobacco: Never Used  . Alcohol use Yes     Comment: occ    Allergies  Allergen Reactions  . Latex   . Naproxen   . Shrimp [Shellfish Allergy]     Current Outpatient Prescriptions  Medication Sig Dispense Refill  . ALPRAZolam (XANAX) 0.5 MG tablet 0.5 mg as needed.     . diclofenac (CATAFLAM) 50 MG tablet Take 50 mg by mouth as needed.    . docusate sodium (COLACE) 50 MG capsule Take 50 mg by mouth 2 (two) times daily. Takes 2 tab TID prn    . fexofenadine (ALLEGRA) 180 MG tablet Take 180 mg by mouth daily.    Marland Kitchen. levalbuterol (XOPENEX HFA) 45 MCG/ACT inhaler Inhale into the lungs every 4 (four) hours as needed for wheezing.    Marland Kitchen. levothyroxine (SYNTHROID, LEVOTHROID) 200 MCG tablet Take 200 mcg by mouth daily.    . Misc Natural Products (CRANBERRY/PROBIOTIC PO) Take by  mouth daily. Cranberry/Probiotic/Prebiotic Blend-2 daily    . montelukast (SINGULAIR) 10 MG tablet Take 10 mg by mouth daily.     . Multiple Vitamins-Minerals (WOMENS MULTI VITAMIN & MINERAL PO) Take by mouth daily.    . phentermine 37.5 MG capsule Take 37.5 mg by mouth every morning.    . sertraline (ZOLOFT) 100 MG tablet TAKE TWO TABLETS BY MOUTH ONCE DAILY 60 tablet 0  . triamcinolone cream (KENALOG) 0.5 % 1 application as needed.      No current facility-administered medications for this visit.      Physical Exam  Blood pressure 128/87, pulse 77, temperature (!) 97.2 F (36.2 C), height 5\' 5"  (1.651 m), weight (!) 346 lb (156.9 kg), last menstrual period 05/08/2017.  Constitutional: overall normal hygiene, normal nutrition, well developed, normal grooming, normal body habitus. Assistive  device:none  Musculoskeletal: gait and station Limp none, muscle tone and strength are normal, no tremors or atrophy is present.  .  Neurological: coordination overall normal.  Deep tendon reflex/nerve stretch intact.  Sensation normal.  Cranial nerves II-XII intact.   Skin:   Normal overall no scars, lesions, ulcers or rashes. No psoriasis.  Psychiatric: Alert and oriented x 3.  Recent memory intact, remote memory unclear.  Normal mood and affect. Well groomed.  Good eye contact.  Cardiovascular: overall no swelling, no varicosities, no edema bilaterally, normal temperatures of the legs and arms, no clubbing, cyanosis and good capillary refill.  Lymphatic: palpation is normal.  Right shoulder has full motion, normal exam.  The patient has been educated about the nature of the problem(s) and counseled on treatment options.  The patient appeared to understand what I have discussed and is in agreement with it.  Encounter Diagnosis  Name Primary?  . Chronic right shoulder pain Yes    PLAN Call if any problems.  Precautions discussed.  Continue current medications.   Return to clinic prn   Electronically Signed Darreld Mclean, MD 8/15/20188:16 AM

## 2017-06-13 DIAGNOSIS — F339 Major depressive disorder, recurrent, unspecified: Secondary | ICD-10-CM | POA: Diagnosis not present

## 2017-06-13 DIAGNOSIS — Z79899 Other long term (current) drug therapy: Secondary | ICD-10-CM | POA: Diagnosis not present

## 2017-06-13 DIAGNOSIS — F411 Generalized anxiety disorder: Secondary | ICD-10-CM | POA: Diagnosis not present

## 2017-10-09 DIAGNOSIS — E039 Hypothyroidism, unspecified: Secondary | ICD-10-CM | POA: Diagnosis not present

## 2018-04-16 DIAGNOSIS — E039 Hypothyroidism, unspecified: Secondary | ICD-10-CM | POA: Diagnosis not present

## 2018-04-16 DIAGNOSIS — R5382 Chronic fatigue, unspecified: Secondary | ICD-10-CM | POA: Diagnosis not present

## 2018-04-16 DIAGNOSIS — J4530 Mild persistent asthma, uncomplicated: Secondary | ICD-10-CM | POA: Diagnosis not present

## 2018-04-16 DIAGNOSIS — F419 Anxiety disorder, unspecified: Secondary | ICD-10-CM | POA: Diagnosis not present

## 2018-04-19 DIAGNOSIS — R5382 Chronic fatigue, unspecified: Secondary | ICD-10-CM | POA: Diagnosis not present

## 2018-04-19 DIAGNOSIS — F419 Anxiety disorder, unspecified: Secondary | ICD-10-CM | POA: Diagnosis not present

## 2018-04-19 DIAGNOSIS — E039 Hypothyroidism, unspecified: Secondary | ICD-10-CM | POA: Diagnosis not present

## 2018-04-19 DIAGNOSIS — J4530 Mild persistent asthma, uncomplicated: Secondary | ICD-10-CM | POA: Diagnosis not present

## 2018-05-03 DIAGNOSIS — E782 Mixed hyperlipidemia: Secondary | ICD-10-CM | POA: Diagnosis not present

## 2018-05-03 DIAGNOSIS — R5382 Chronic fatigue, unspecified: Secondary | ICD-10-CM | POA: Diagnosis not present

## 2018-05-03 DIAGNOSIS — E039 Hypothyroidism, unspecified: Secondary | ICD-10-CM | POA: Diagnosis not present

## 2018-05-03 DIAGNOSIS — F411 Generalized anxiety disorder: Secondary | ICD-10-CM | POA: Diagnosis not present

## 2018-06-23 DIAGNOSIS — L308 Other specified dermatitis: Secondary | ICD-10-CM | POA: Diagnosis not present

## 2018-09-16 DIAGNOSIS — E039 Hypothyroidism, unspecified: Secondary | ICD-10-CM | POA: Diagnosis not present

## 2018-09-16 DIAGNOSIS — R5382 Chronic fatigue, unspecified: Secondary | ICD-10-CM | POA: Diagnosis not present

## 2018-09-16 DIAGNOSIS — F419 Anxiety disorder, unspecified: Secondary | ICD-10-CM | POA: Diagnosis not present

## 2018-09-16 DIAGNOSIS — F330 Major depressive disorder, recurrent, mild: Secondary | ICD-10-CM | POA: Diagnosis not present

## 2018-09-20 DIAGNOSIS — Z79899 Other long term (current) drug therapy: Secondary | ICD-10-CM | POA: Insufficient documentation

## 2018-09-20 DIAGNOSIS — F411 Generalized anxiety disorder: Secondary | ICD-10-CM | POA: Insufficient documentation

## 2018-09-20 DIAGNOSIS — J4530 Mild persistent asthma, uncomplicated: Secondary | ICD-10-CM | POA: Insufficient documentation

## 2018-09-20 DIAGNOSIS — G9332 Myalgic encephalomyelitis/chronic fatigue syndrome: Secondary | ICD-10-CM | POA: Insufficient documentation

## 2018-09-20 DIAGNOSIS — M25511 Pain in right shoulder: Secondary | ICD-10-CM | POA: Insufficient documentation

## 2018-09-20 DIAGNOSIS — F330 Major depressive disorder, recurrent, mild: Secondary | ICD-10-CM | POA: Insufficient documentation

## 2018-10-14 DIAGNOSIS — Z79899 Other long term (current) drug therapy: Secondary | ICD-10-CM | POA: Diagnosis not present

## 2018-10-14 DIAGNOSIS — Z0000 Encounter for general adult medical examination without abnormal findings: Secondary | ICD-10-CM | POA: Diagnosis not present

## 2018-10-14 DIAGNOSIS — E039 Hypothyroidism, unspecified: Secondary | ICD-10-CM | POA: Diagnosis not present

## 2018-10-16 DIAGNOSIS — Z79899 Other long term (current) drug therapy: Secondary | ICD-10-CM | POA: Diagnosis not present

## 2018-10-16 DIAGNOSIS — E039 Hypothyroidism, unspecified: Secondary | ICD-10-CM | POA: Diagnosis not present

## 2018-10-16 DIAGNOSIS — E785 Hyperlipidemia, unspecified: Secondary | ICD-10-CM | POA: Diagnosis not present

## 2018-11-04 ENCOUNTER — Ambulatory Visit (HOSPITAL_COMMUNITY)
Admission: RE | Admit: 2018-11-04 | Discharge: 2018-11-04 | Disposition: A | Discharge status: Home / Self Care | Payer: BLUE CROSS/BLUE SHIELD | Source: Ambulatory Visit | Attending: Internal Medicine | Admitting: Internal Medicine

## 2018-11-04 ENCOUNTER — Other Ambulatory Visit: Payer: Self-pay

## 2019-04-13 DIAGNOSIS — F419 Anxiety disorder, unspecified: Secondary | ICD-10-CM | POA: Diagnosis not present

## 2019-04-13 DIAGNOSIS — F330 Major depressive disorder, recurrent, mild: Secondary | ICD-10-CM | POA: Diagnosis not present

## 2019-04-13 DIAGNOSIS — E039 Hypothyroidism, unspecified: Secondary | ICD-10-CM | POA: Diagnosis not present

## 2019-05-02 DIAGNOSIS — E785 Hyperlipidemia, unspecified: Secondary | ICD-10-CM | POA: Diagnosis not present

## 2019-05-02 DIAGNOSIS — E039 Hypothyroidism, unspecified: Secondary | ICD-10-CM | POA: Diagnosis not present

## 2019-05-11 DIAGNOSIS — E039 Hypothyroidism, unspecified: Secondary | ICD-10-CM | POA: Diagnosis not present

## 2019-05-11 DIAGNOSIS — Z79899 Other long term (current) drug therapy: Secondary | ICD-10-CM | POA: Diagnosis not present

## 2019-05-11 DIAGNOSIS — Z6841 Body Mass Index (BMI) 40.0 and over, adult: Secondary | ICD-10-CM | POA: Diagnosis not present

## 2019-06-02 DIAGNOSIS — E039 Hypothyroidism, unspecified: Secondary | ICD-10-CM | POA: Diagnosis not present

## 2019-06-02 DIAGNOSIS — R5382 Chronic fatigue, unspecified: Secondary | ICD-10-CM | POA: Diagnosis not present

## 2019-07-06 DIAGNOSIS — E039 Hypothyroidism, unspecified: Secondary | ICD-10-CM | POA: Diagnosis not present

## 2019-07-06 DIAGNOSIS — Z79899 Other long term (current) drug therapy: Secondary | ICD-10-CM | POA: Diagnosis not present

## 2019-07-13 DIAGNOSIS — F419 Anxiety disorder, unspecified: Secondary | ICD-10-CM | POA: Diagnosis not present

## 2019-07-13 DIAGNOSIS — E039 Hypothyroidism, unspecified: Secondary | ICD-10-CM | POA: Diagnosis not present

## 2019-07-13 DIAGNOSIS — Z6841 Body Mass Index (BMI) 40.0 and over, adult: Secondary | ICD-10-CM | POA: Diagnosis not present

## 2019-07-29 ENCOUNTER — Telehealth: Payer: Self-pay | Admitting: *Deleted

## 2019-07-29 NOTE — Telephone Encounter (Signed)
Informed patient that she is considered a new patient since we haven't seen her in 3 years. Offered to make appointment but that it wouldn't be today. Pt states that she is going to go to Urgent Care but will call us back at some point to get a visit.

## 2019-07-29 NOTE — Telephone Encounter (Signed)
Patient called regarding UTI.  Has not been seen in our office in 3 years so she will need new GYN appt with provider or can try PCP or Urgent Care.

## 2019-07-29 NOTE — Telephone Encounter (Signed)
Called patient back and phone just rings. Sent [employer].

## 2019-07-29 NOTE — Telephone Encounter (Signed)
Patient called back stating someone called but could not hear her.  Please call patient back.

## 2019-08-01 DIAGNOSIS — R300 Dysuria: Secondary | ICD-10-CM | POA: Diagnosis not present

## 2019-08-01 DIAGNOSIS — N390 Urinary tract infection, site not specified: Secondary | ICD-10-CM | POA: Diagnosis not present

## 2019-08-30 DIAGNOSIS — E039 Hypothyroidism, unspecified: Secondary | ICD-10-CM | POA: Diagnosis not present

## 2019-09-06 DIAGNOSIS — F330 Major depressive disorder, recurrent, mild: Secondary | ICD-10-CM | POA: Diagnosis not present

## 2019-09-06 DIAGNOSIS — E039 Hypothyroidism, unspecified: Secondary | ICD-10-CM | POA: Diagnosis not present

## 2019-09-06 DIAGNOSIS — F419 Anxiety disorder, unspecified: Secondary | ICD-10-CM | POA: Diagnosis not present

## 2019-11-14 ENCOUNTER — Other Ambulatory Visit: Payer: Self-pay | Admitting: Adult Health

## 2019-11-14 ENCOUNTER — Ambulatory Visit (INDEPENDENT_AMBULATORY_CARE_PROVIDER_SITE_OTHER): Payer: BC Managed Care – PPO | Admitting: Adult Health

## 2019-11-14 ENCOUNTER — Other Ambulatory Visit: Payer: Self-pay

## 2019-11-14 ENCOUNTER — Encounter: Payer: Self-pay | Admitting: Adult Health

## 2019-11-14 VITALS — BP 162/98 | HR 88 | Ht 65.0 in | Wt >= 6400 oz

## 2019-11-14 DIAGNOSIS — N943 Premenstrual tension syndrome: Secondary | ICD-10-CM | POA: Insufficient documentation

## 2019-11-14 DIAGNOSIS — N920 Excessive and frequent menstruation with regular cycle: Secondary | ICD-10-CM | POA: Insufficient documentation

## 2019-11-14 DIAGNOSIS — N946 Dysmenorrhea, unspecified: Secondary | ICD-10-CM

## 2019-11-14 DIAGNOSIS — F341 Dysthymic disorder: Secondary | ICD-10-CM

## 2019-11-14 MED ORDER — HYDROXYZINE HCL 10 MG PO TABS: 10.0000 mg | ORAL_TABLET | Freq: Three times a day (TID) | ORAL | 3 refills | Status: DC | PRN

## 2019-11-14 NOTE — Patient Instructions (Signed)
Premenstrual Syndrome Premenstrual syndrome (PMS) is a group of physical, emotional, and behavioral symptoms that affect women of childbearing age as part of their menstrual cycle. PMS starts 1-2 weeks before the start of a woman's menstrual period and goes away a few days after menstrual bleeding starts. It often happens in a predictable pattern (recurs). PMS may cause other health conditions to become worse, such as asthma, allergies, and migraines. PMS can range from mild to severe. When it is severe, it is called premenstrual dysphoric disorder (PMDD). PMS may interfere with normal daily activities. What are the causes? The cause of this condition is not known, but it seems to be related to hormone changes that happen before menstruation. What are the signs or symptoms? Symptoms of this condition often happen every month. They go away completely after your period starts. Physical symptoms of this condition include:  Bloating.  Breast pain.  Headaches.  Extreme fatigue.  Backaches.  Swelling of the hands and feet.  Weight gain.  Hot flashes. Emotional and behavioral symptoms of this condition include:  Mood swings.  Depression.  Angry outbursts.  Irritability.  Anxiety.  Crying spells.  Food cravings or appetite changes.  Changes in sexual desire.  Confusion.  Aggression.  Social withdrawal.  Poor concentration. How is this diagnosed? This condition may be diagnosed based on a history of your symptoms. This condition is generally diagnosed if symptoms of PMS:  Are present in the 5 days before your period starts.  End within 4 days after your period starts.  Happen at least 3 months in a row.  Interfere with some of your normal activities. Other conditions that can cause some of these symptoms must be ruled out before PMS can be diagnosed. These include depression, anxiety, anemia, and thyroid problems. How is this treated? This condition may be treated  by:  Maintaining a healthy lifestyle. This includes eating a well-balanced diet and exercising regularly.  Taking medicines. Medicines can help relieve symptoms such as cramps, aches, pains, headaches, and breast tenderness. Depending on the severity of the condition, your health care provider may recommend various over-the-counter pain medicines. Follow these instructions at home: Eating and drinking   Eat a well-balanced diet.  Avoid caffeine and alcohol.  Limit the amount of salt and salty foods you eat. This will help reduce bloating.  Drink enough fluid to keep your urine pale yellow.  Take a multivitamin if told to do so by your health care provider. Lifestyle   Do not use any products that contain nicotine or tobacco, such as cigarettes, e-cigarettes, and chewing tobacco. If you need help quitting, ask your health care provider.  Exercise regularly as suggested by your health care provider.  Get enough sleep. For most adults, this is 7-8 hours of sleep each night.  Practice relaxation techniques such as yoga, tai chi, or meditation.  Find healthy ways to manage stress. General instructions   For 2-3 months, write down your symptoms, their severity, and how long they last. This will help your health care provider choose the best treatment for you.  Take over-the-counter and prescription medicines only as told by your health care provider.  If you are using birth control pills (oral contraceptives), use them as told by your health care provider. Contact a health care provider if:  Your symptoms get worse.  You develop new symptoms.  You have trouble doing your daily activities. Summary  Premenstrual syndrome (PMS) is a group of physical, emotional, and behavioral symptoms that   affect women of childbearing age.  PMS starts 1-2 weeks before the start of a woman's period and goes away a few days after the period starts.  PMS is treated by maintaining a healthy  lifestyle and taking medicines to relieve the symptoms. This information is not intended to replace advice given to you by your health care provider. Make sure you discuss any questions you have with your health care provider. Document Revised: 05/12/2018 Document Reviewed: 05/12/2018 Elsevier Patient Education  2020 [employer]  

## 2019-11-14 NOTE — Progress Notes (Signed)
Will rx vistaril to [facility]

## 2019-11-14 NOTE — Progress Notes (Signed)
  Subjective:     Patient ID: [name], female   DOB: 06/28/1976, 44 y.o.   MRN: [id number]  HPI [name] is a 44 year old white female, married, G2P2 in as new pt, complaining of PMS, mood swings and heavy periods and other multiple complaints in ROS. She works at [employer].  PCP is [facility], now due to new insurance.She has had labs there.  She said she took birth control pills years ago but they made her mean. She ask about refill on xanax, will have to talk with PCP.  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 Reviewed medications and allergies.     Objective:   Physical Exam BP (!) 162/98 (BP Location: Left Wrist, Patient Position: Sitting, Cuff Size: Normal)   Pulse 88   Ht 5\' 5"  (1.651 m)   Wt (!) 449 lb 9.6 oz (203.9 kg)   LMP 11/14/2019 (Exact Date)   BMI 74.82 kg/m  Skin warm and dry. Neck: mid line trachea, normal thyroid, good ROM, no lymphadenopathy noted. Lungs: clear to ausculation bilaterally. Cardiovascular: regular rate and rhythm.Fall risk is low PHQ 9 score is 25, is on Zoloft and Effexor, denies any SI or HI.    Assessment:     1. Menorrhagia with regular cycle Return in about a week for GYN 01/12/2020 and then see me 3-5 days later for pap and physical   2. Dysmenorrhea Will get GYN [place]   3. PMS (premenstrual syndrome) Will try vistaril  Review handout on PMS Discussed may try HRT, if [place] normal   4. ANXIETY DEPRESSION Will add vistaril to Zoloft and Effexor and refer to [facility]    Plan:     Will get [facility] in about a week and then pap and physical

## 2019-11-22 ENCOUNTER — Ambulatory Visit (INDEPENDENT_AMBULATORY_CARE_PROVIDER_SITE_OTHER): Payer: BC Managed Care – PPO

## 2019-11-22 ENCOUNTER — Other Ambulatory Visit: Payer: Self-pay

## 2019-11-22 DIAGNOSIS — N946 Dysmenorrhea, unspecified: Secondary | ICD-10-CM

## 2019-11-22 DIAGNOSIS — N920 Excessive and frequent menstruation with regular cycle: Secondary | ICD-10-CM

## 2019-11-22 NOTE — Progress Notes (Signed)
PELVIC US TA/TV: heterogeneous anteverted uterus,posterior left intramural fibroid 2 x 1.6 x 2.7 cm,normal right ovary,left ovary not visualized,no free fluid,some pelvic discomfort during ultrasound,limited view because of pt body habitus  Chaperone:Angie

## 2019-11-29 ENCOUNTER — Other Ambulatory Visit (HOSPITAL_COMMUNITY)
Admission: RE | Admit: 2019-11-29 | Discharge: 2019-11-29 | Disposition: A | Discharge status: Home / Self Care | Payer: BC Managed Care – PPO | Source: Ambulatory Visit | Attending: Adult Health | Admitting: Adult Health

## 2019-11-29 ENCOUNTER — Encounter: Payer: Self-pay | Admitting: Adult Health

## 2019-11-29 ENCOUNTER — Other Ambulatory Visit: Payer: Self-pay

## 2019-11-29 ENCOUNTER — Ambulatory Visit (INDEPENDENT_AMBULATORY_CARE_PROVIDER_SITE_OTHER): Payer: BC Managed Care – PPO | Admitting: Adult Health

## 2019-11-29 VITALS — BP 143/84 | HR 84 | Ht 65.0 in | Wt >= 6400 oz

## 2019-11-29 DIAGNOSIS — Z1212 Encounter for screening for malignant neoplasm of rectum: Secondary | ICD-10-CM

## 2019-11-29 DIAGNOSIS — N920 Excessive and frequent menstruation with regular cycle: Secondary | ICD-10-CM | POA: Diagnosis not present

## 2019-11-29 DIAGNOSIS — Z1211 Encounter for screening for malignant neoplasm of colon: Secondary | ICD-10-CM | POA: Diagnosis not present

## 2019-11-29 DIAGNOSIS — N946 Dysmenorrhea, unspecified: Secondary | ICD-10-CM | POA: Diagnosis not present

## 2019-11-29 DIAGNOSIS — F341 Dysthymic disorder: Secondary | ICD-10-CM

## 2019-11-29 DIAGNOSIS — Z01419 Encounter for gynecological examination (general) (routine) without abnormal findings: Secondary | ICD-10-CM | POA: Insufficient documentation

## 2019-11-29 DIAGNOSIS — E039 Hypothyroidism, unspecified: Secondary | ICD-10-CM | POA: Diagnosis not present

## 2019-11-29 DIAGNOSIS — N943 Premenstrual tension syndrome: Secondary | ICD-10-CM

## 2019-11-29 LAB — HEMOCCULT GUIAC POC 1CARD (OFFICE): Fecal Occult Blood, POC: NEGATIVE

## 2019-11-29 MED ORDER — NORETHINDRONE ACETATE 5 MG PO TABS: ORAL_TABLET | ORAL | 3 refills | Status: DC

## 2019-11-29 NOTE — Progress Notes (Signed)
Patient ID: [name], female   DOB: [birth year], 44 y.o.   MRN: [id number] History of Present Illness: [name] is a 44 year old white female, married, G2P2 in for a well woman gyn exam and pap.She had [place] on 11/22/19 for menorrhagia and it was normal except for small fibroid.  PCP is [facility]   Current Medications, Allergies, Past Medical History, Past Surgical History, Family History and Social History were reviewed in [employer] record.     Review of Systems: Patient denies any hearing loss,  blurred vision, shortness of breath, chest pain, abdominal pain, problems with urination.  No joint pain or mood swings. Has chronic fatigue Has chronic constipation  Heavy periods with clots and cramps Pain with sex Has depression and anxiety, PMS, has some headaches   Physical Exam:BP (!) 143/84 (BP Location: Left Wrist, Patient Position: Sitting, Cuff Size: Normal)   Pulse 84   Ht 5\' 5"  (1.651 m)   Wt (!) 448 lb 3.2 oz (203.3 kg)   LMP 11/14/2019 (Exact Date)   BMI 74.58 kg/m  General:  Well developed, well nourished, no acute distress Skin:  Warm and dry Neck:  Midline trachea, normal thyroid, good ROM, no lymphadenopathy Lungs; Clear to auscultation bilaterally Breast:  No dominant palpable mass, retraction, or nipple discharge Cardiovascular: Regular rate and rhythm Abdomen:  Soft, non tender, no hepatosplenomegaly.obese Pelvic:  External genitalia is normal in appearance, no lesions.  The vagina is normal in appearance. Urethra has no lesions or masses. The cervix is smooth, poorly visualized with ovulatory mucous, pap with GC/CHL and high risk HPV 16/18 genotyping performed  Uterus is felt to be normal size, shape, and contour, but very difficult exam due to size.  No adnexal masses or tenderness noted.Bladder is non tender, no masses felt. Rectal: Good sphincter tone, no polyps, or hemorrhoids felt.  Hemoccult negative. Extremities/musculoskeletal:   No swelling or varicosities noted, no clubbing or cyanosis Psych:  alert and cooperative,seems happy Fall risk is low PHQ 9 score is 23, denies being suicidal Examination chaperoned by 01/12/2020 NP student who also assisted with exam  Impression and Plan: 1. Encounter for gynecological examination with Papanicolaou smear of cervix Pap sent Physical in 1 year Pap in 3 if normal Labs with PCP Get mammogram now and yearly  2. Screening for colorectal cancer Colonoscopy per GI   3. Menorrhagia with regular cycle Discussed options, IUD, aygestin, or ablation Will try aygestin first, was discussed with Dr Richelle Ito and would have to have IUD failure to consider ablation due to operative risks Meds ordered this encounter  Medications  . norethindrone (AYGESTIN) 5 MG tablet    Sig: Take 2 daily    Dispense:  60 tablet    Refill:  3    Order Specific Question:   Supervising Provider    Answer:   Despina Hidden H [[id number]]   Will follow up in  8 weeks  4. Dysmenorrhea   5. ANXIETY DEPRESSION Continue meds and take 2 vistaril if needed, keep appt, with [facility] 12/17/19  6. PMS (premenstrual syndrome)  7. Morbid obesity (HCC)

## 2019-12-02 LAB — CYTOLOGY - PAP
Adequacy: ABSENT
Chlamydia: NEGATIVE
Comment: NEGATIVE
Comment: NEGATIVE
Comment: NORMAL
Diagnosis: NEGATIVE
High risk HPV: NEGATIVE
Neisseria Gonorrhea: NEGATIVE

## 2019-12-07 DIAGNOSIS — F321 Major depressive disorder, single episode, moderate: Secondary | ICD-10-CM | POA: Insufficient documentation

## 2019-12-07 DIAGNOSIS — F419 Anxiety disorder, unspecified: Secondary | ICD-10-CM | POA: Diagnosis not present

## 2019-12-07 DIAGNOSIS — E039 Hypothyroidism, unspecified: Secondary | ICD-10-CM | POA: Diagnosis not present

## 2019-12-07 DIAGNOSIS — R5382 Chronic fatigue, unspecified: Secondary | ICD-10-CM | POA: Diagnosis not present

## 2019-12-16 DIAGNOSIS — Z23 Encounter for immunization: Secondary | ICD-10-CM | POA: Diagnosis not present

## 2019-12-17 ENCOUNTER — Encounter (HOSPITAL_COMMUNITY): Payer: Self-pay | Admitting: Psychiatry

## 2019-12-17 ENCOUNTER — Other Ambulatory Visit: Payer: Self-pay

## 2019-12-17 ENCOUNTER — Ambulatory Visit (HOSPITAL_COMMUNITY): Payer: BC Managed Care – PPO | Admitting: Psychiatry

## 2019-12-17 DIAGNOSIS — F331 Major depressive disorder, recurrent, moderate: Secondary | ICD-10-CM

## 2019-12-17 DIAGNOSIS — F5105 Insomnia due to other mental disorder: Secondary | ICD-10-CM

## 2019-12-17 DIAGNOSIS — F411 Generalized anxiety disorder: Secondary | ICD-10-CM

## 2019-12-17 DIAGNOSIS — F99 Mental disorder, not otherwise specified: Secondary | ICD-10-CM

## 2019-12-17 MED ORDER — VENLAFAXINE HCL ER 75 MG PO CP24: 225.0000 mg | ORAL_CAPSULE | Freq: Every day | ORAL | 0 refills | Status: DC

## 2019-12-17 MED ORDER — HYDROXYZINE HCL 50 MG PO TABS: 50.0000 mg | ORAL_TABLET | Freq: Every evening | ORAL | 0 refills | Status: DC | PRN

## 2019-12-17 MED ORDER — CLONIDINE HCL 0.1 MG PO TABS: 0.1000 mg | ORAL_TABLET | Freq: Two times a day (BID) | ORAL | 1 refills | Status: DC | PRN
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
## 2019-12-26 ENCOUNTER — Telehealth (HOSPITAL_COMMUNITY): Payer: Self-pay | Admitting: *Deleted

## 2019-12-26 NOTE — Telephone Encounter (Signed)
Writer attempted to return pt call c/o "severe side effects from new medicine". Unfortunately pt VM box is full. Writer will attempt to reach out tomorrow.

## 2019-12-28 ENCOUNTER — Telehealth: Payer: Self-pay
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
## 2020-01-05 ENCOUNTER — Other Ambulatory Visit (HOSPITAL_COMMUNITY): Payer: Self-pay | Admitting: Psychiatry

## 2020-01-05 DIAGNOSIS — F331 Major depressive disorder, recurrent, moderate: Secondary | ICD-10-CM

## 2020-01-05 DIAGNOSIS — F5105 Insomnia due to other mental disorder: Secondary | ICD-10-CM

## 2020-01-05 DIAGNOSIS — F99 Mental disorder, not otherwise specified: Secondary | ICD-10-CM

## 2020-01-05 DIAGNOSIS — F411 Generalized anxiety disorder: Secondary | ICD-10-CM

## 2020-01-05 MED ORDER — QUETIAPINE FUMARATE 25 MG PO TABS: ORAL_TABLET | ORAL | 0 refills | Status: DC

## 2020-01-05 MED ORDER — BUSPIRONE HCL 10 MG PO TABS: 10.0000 mg | ORAL_TABLET | Freq: Two times a day (BID) | ORAL | 0 refills | Status: DC
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
## 2020-01-10 ENCOUNTER — Telehealth (HOSPITAL_COMMUNITY): Payer: Self-pay

## 2020-01-10 NOTE — Telephone Encounter (Signed)
Prior authorization sent for Quetiapine 25mg  tablets. Approved effective 01/10/2020 through 01/07/2021.   Key: [id number]

## 2020-01-11 DIAGNOSIS — Z23 Encounter for immunization: Secondary | ICD-10-CM | POA: Diagnosis not present

## 2020-01-16 ENCOUNTER — Ambulatory Visit (INDEPENDENT_AMBULATORY_CARE_PROVIDER_SITE_OTHER): Payer: BC Managed Care – PPO | Admitting: Adult Health

## 2020-01-16 ENCOUNTER — Other Ambulatory Visit: Payer: Self-pay

## 2020-01-16 ENCOUNTER — Encounter: Payer: Self-pay | Admitting: Adult Health

## 2020-01-16 VITALS — BP 146/86 | HR 88 | Ht 65.0 in | Wt >= 6400 oz

## 2020-01-16 DIAGNOSIS — N946 Dysmenorrhea, unspecified: Secondary | ICD-10-CM | POA: Diagnosis not present

## 2020-01-16 DIAGNOSIS — N920 Excessive and frequent menstruation with regular cycle: Secondary | ICD-10-CM

## 2020-01-16 NOTE — Progress Notes (Signed)
  Subjective:     Patient ID: [name], female   DOB: March 17, 1976, 44 y.o.   MRN: [id number]  HPI [name] is a 44 year old white female,married, G2P2 in complaining of passing large clots with tissue and bad cramping for a few days with this cycle. She was started on aygestin to control cycles 11/29/19 and did not have period first of March as scheduled, but started spotting 3/31 when got second COVID vaccine and started generic Seroquel. Then passed the clots and had severe cramping. PCP is the [facility].  Review of Systems Passing clots and cramping Reviewed past medical,surgical, social and family history. Reviewed medications and allergies.     Objective:   Physical Exam BP (!) 146/86 (BP Location: Left Arm, Patient Position: Sitting, Cuff Size: Large)   Pulse 88   Ht 5\' 5"  (1.651 m)   Wt (!) 450 lb (204.1 kg)   LMP 01/11/2020   BMI 74.88 kg/m  Skin warm and dry.  Lungs: clear to ausculation bilaterally. Cardiovascular: regular rate and rhythm.     Assessment:    1. Menorrhagia with regular cycle Continue aygestin 10 mg daily Discussed that bleeding with clots and cramps, probably not related to vaccine and Seroquel, continue aygestin, she is NOT interested in IUD.   2. Dysmenorrhea     Plan:     Follow up 02/21/20

## 2020-01-23 ENCOUNTER — Telehealth: Payer: Self-pay | Admitting: Adult Health

## 2020-01-23 NOTE — Telephone Encounter (Signed)
Telephoned patient at home number. Patient stats has been bleeding for 12 days. Currently taking Norethindone 5 mg two tablets every morning. Patient feels Seroquel is a contributing factor and going to talk provider that prescribed to see if can stop and her another medication. Would like something to help stop bleeding. Message sent to Cyril Mourning, NP.

## 2020-01-23 NOTE — Telephone Encounter (Signed)
Pt states that she is still bleeding even with taking the medication that was suppose to help her stop bleeding. She would like to talk to the nurse or Victorino Dike.

## 2020-01-23 NOTE — Telephone Encounter (Signed)
Pt says when she takes Seroquel she bleeds, so continue aygestin and call PCP about Seroquel

## 2020-01-24 ENCOUNTER — Ambulatory Visit: Payer: BC Managed Care – PPO | Admitting: Adult Health

## 2020-01-31 ENCOUNTER — Telehealth (HOSPITAL_COMMUNITY): Payer: Self-pay | Admitting: *Deleted

## 2020-01-31 NOTE — Telephone Encounter (Signed)
Writer returned pt call regarding possible interaction between Covid vaccine and Seroquel. Pt reports that the same day she 2nd vaccine and then took the Seroquel she started passing large amounts of blood and clots vaginally. Pt says she thought maybe she had a miscarriage. Pt did see GYN who told her that the bleeding was not coming from either the vaccine or Seroquel. So pt took medication again and the same thing happened. Pt states that she has researched this and it has been a documented SE from Covid vaccine. Pt has stopped taking the Seroquel and states that her bleeding has lessened considerably. Pt does have Vistaril which is using currently. Pt has an upcoming appointment on 02/02/20 and will discuss with you at that time.

## 2020-01-31 NOTE — Telephone Encounter (Deleted)
Writer returned pt call regarding possible reaction between Covid vaccine and Seroquel. Pt reports that the same day she had the 2nd vaccine and then took her Seroquel, she started passing large amounts of blood and clots. Pt thought maybe she

## 2020-02-02 ENCOUNTER — Encounter (HOSPITAL_COMMUNITY): Payer: Self-pay | Admitting: Psychiatry

## 2020-02-02 ENCOUNTER — Telehealth (INDEPENDENT_AMBULATORY_CARE_PROVIDER_SITE_OTHER): Payer: BC Managed Care – PPO | Admitting: Psychiatry

## 2020-02-02 ENCOUNTER — Other Ambulatory Visit: Payer: Self-pay

## 2020-02-02 DIAGNOSIS — F411 Generalized anxiety disorder: Secondary | ICD-10-CM

## 2020-02-02 DIAGNOSIS — F331 Major depressive disorder, recurrent, moderate: Secondary | ICD-10-CM | POA: Diagnosis not present

## 2020-02-02 MED ORDER — VENLAFAXINE HCL ER 75 MG PO CP24: 225.0000 mg | ORAL_CAPSULE | Freq: Every day | ORAL | 0 refills | Status: DC

## 2020-02-02 MED ORDER — LORAZEPAM 1 MG PO TABS: 1.0000 mg | ORAL_TABLET | Freq: Every day | ORAL | 0 refills | Status: DC | PRN

## 2020-02-02 MED ORDER — BUSPIRONE HCL 15 MG PO TABS: 15.0000 mg | ORAL_TABLET | Freq: Two times a day (BID) | ORAL | 0 refills | Status: DC

## 2020-02-02 NOTE — Progress Notes (Signed)
   Virtual Visit via Telephone Note  I connected with Sheleen Conchas Selinger on 02/02/20 at  8:30 AM EDT by telephone and verified that I am speaking with the correct person using two identifiers.  Location: Patient: in car on way to work Provider: office   I discussed the limitations, risks, security and privacy concerns of performing an evaluation and management service by telephone and the availability of in person appointments. I also discussed with the patient that there may be a patient responsible charge related to this service. The patient expressed understanding and agreed to proceed.   History of Present Illness: "Hanging in there". She had her 2nd COVID vaccine and started Seroquel the same day. She experienced severe vaginal bleeding. Her gynecologist examined her and stated she didn&#39;t think it was due to COVID vaccine or Seroquel. Torsha restarted Seroquel and her bleeding got worse. After a few days she stopped it and her bleeding significantly improved. The Buspar is helping her anxiety. On good days she is having less anxiety. On stressful days she takes the Vistaril but it doesn&#39;t help much. She is more concerned because her stress tolerance is low and that is unlike her. The depression is manageable with the current dose of Effoxor. Sleep is poor. It takes her a while to fall asleep. She denies SI/HI. Another stress is on/off tenderness in her breast. She needs a mammogram but won&#39;t get it done until her anxiety is under control. She is unable to use masks due to the anxiety.     Observations/Objective:  General Appearance: unable to assess  Eye Contact:  unable to assess  Speech:  Clear and Coherent and Normal Rate  Volume:  Normal  Mood:  Anxious and Depressed  Affect:  Congruent  Thought Process:  Goal Directed, Linear and Descriptions of Associations: Intact  Orientation:  Full (Time, Place, and Person)  Thought Content:  Logical  Suicidal Thoughts:  No   Homicidal Thoughts:  No  Memory:  Immediate;   Good  Judgement:  Good  Insight:  Good  Psychomotor Activity: unable to assess  Concentration:  Concentration: Good  Recall:  Good  Fund of Knowledge:  Good  Language:  Good  Akathisia:  unable to assess  Handed:  Right  AIMS (if indicated):     Assets:  Communication Skills Desire for Improvement Financial Resources/Insurance Housing Resilience Social Support Talents/Skills Transportation Vocational/Educational  ADL's:  unable to assess  Cognition:  WNL  Sleep:         Assessment and Plan: MDD- recurrent, moderate; GAD  Start trial of Ativan 0.5-1mg  po qD prn anxiety  Increase Buspar 15mg  po BID   Continue Effexor XR 225mg  po qD  D/c Seroquel D/c Vistaril  Starting therapy next month  Follow Up Instructions:  8-10 weeks or sooner if needed   I discussed the assessment and treatment plan with the patient. The patient was provided an opportunity to ask questions and all were answered. The patient agreed with the plan and demonstrated an understanding of the instructions.   The patient was advised to call back or seek an in-person evaluation if the symptoms worsen or if the condition fails to improve as anticipated.  I provided 20 minutes of non-face-to-face time during this encounter.   , MD

## 2020-02-13 ENCOUNTER — Other Ambulatory Visit: Payer: Self-pay

## 2020-02-13 ENCOUNTER — Encounter (HOSPITAL_COMMUNITY): Payer: Self-pay | Admitting: Psychiatry

## 2020-02-13 ENCOUNTER — Telehealth (INDEPENDENT_AMBULATORY_CARE_PROVIDER_SITE_OTHER): Payer: BC Managed Care – PPO | Admitting: Psychiatry

## 2020-02-13 DIAGNOSIS — F411 Generalized anxiety disorder: Secondary | ICD-10-CM

## 2020-02-13 DIAGNOSIS — F331 Major depressive disorder, recurrent, moderate: Secondary | ICD-10-CM | POA: Diagnosis not present
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
## 2020-02-17 ENCOUNTER — Telehealth (HOSPITAL_COMMUNITY): Payer: Self-pay | Admitting: *Deleted

## 2020-02-17 NOTE — Telephone Encounter (Signed)
Pt is asking if she can increase the Ativan to !mg twice a day prn as she feels she needs more at work than anything else. Says dhe does not take on the weekends. Pt next appointment is on 03/29/20. Please review and advise.

## 2020-02-21 ENCOUNTER — Ambulatory Visit (INDEPENDENT_AMBULATORY_CARE_PROVIDER_SITE_OTHER): Payer: BC Managed Care – PPO | Admitting: Adult Health

## 2020-02-21 ENCOUNTER — Other Ambulatory Visit: Payer: Self-pay

## 2020-02-21 ENCOUNTER — Encounter: Payer: Self-pay | Admitting: Adult Health

## 2020-02-21 VITALS — BP 150/84 | HR 84 | Ht 65.0 in | Wt >= 6400 oz

## 2020-02-21 DIAGNOSIS — N920 Excessive and frequent menstruation with regular cycle: Secondary | ICD-10-CM

## 2020-02-21 DIAGNOSIS — N946 Dysmenorrhea, unspecified: Secondary | ICD-10-CM | POA: Diagnosis not present

## 2020-02-21 DIAGNOSIS — K068 Other specified disorders of gingiva and edentulous alveolar ridge: Secondary | ICD-10-CM | POA: Diagnosis not present

## 2020-02-21 MED ORDER — NORETHINDRONE ACETATE 5 MG PO TABS: ORAL_TABLET | ORAL | 3 refills | Status: DC

## 2020-02-21 NOTE — Progress Notes (Signed)
  Subjective:     Patient ID: [name], female   DOB: 1976-04-07, 44 y.o.   MRN: [id number]  HPI [name] is a 44 year old white white female, married, G2P2, back in follow up on bleeding and has not had any this month on aygestin. PCP is [facility].   Review of Systems No bleeding this month on aygestin Tender area left underarm Bottom gums bleeding, has not seen dentist lately Reviewed past medical,surgical, social and family history. Reviewed medications and allergies.     Objective:   Physical Exam BP (!) 150/84 (BP Location: Left Arm, Patient Position: Sitting, Cuff Size: Normal)   Pulse 84   Ht 5\' 5"  (1.651 m)   Wt (!) 458 lb 6.4 oz (207.9 kg)   LMP 01/12/2020   BMI 76.28 kg/m   Fall risk is moderate Skin warm and dry.Lungs: clear to ausculation bilaterally. Cardiovascular: regular rate and rhythm.Has point tenderness latissimus dorsi, left under arm    Assessment:    1. Menorrhagia with regular cycle Continue aygestin Refilled aygestin Meds ordered this encounter  Medications  . norethindrone (AYGESTIN) 5 MG tablet    Sig: Take 2 daily    Dispense:  60 tablet    Refill:  3    Order Specific Question:   Supervising Provider    Answer:   03/13/2020, LUTHER H [[id number]]    2. Dysmenorrhea  3. Gums, bleeding Try to see dentist  Get labs at PCP soon      Plan:   Call health dept. About dental times, call Dr Despina Hidden about self pay Continue aygestin Follow up in 3 months

## 2020-03-01 ENCOUNTER — Encounter: Payer: Self-pay | Admitting: Adult Health

## 2020-03-01 DIAGNOSIS — Z79899 Other long term (current) drug therapy: Secondary | ICD-10-CM | POA: Diagnosis not present

## 2020-03-01 DIAGNOSIS — E785 Hyperlipidemia, unspecified: Secondary | ICD-10-CM | POA: Diagnosis not present

## 2020-03-01 DIAGNOSIS — E039 Hypothyroidism, unspecified: Secondary | ICD-10-CM | POA: Diagnosis not present

## 2020-03-01 DIAGNOSIS — Z0000 Encounter for general adult medical examination without abnormal findings: Secondary | ICD-10-CM | POA: Diagnosis not present

## 2020-03-02 ENCOUNTER — Other Ambulatory Visit: Payer: Self-pay

## 2020-03-02 ENCOUNTER — Encounter (HOSPITAL_COMMUNITY): Payer: Self-pay | Admitting: Psychiatry

## 2020-03-02 ENCOUNTER — Ambulatory Visit (INDEPENDENT_AMBULATORY_CARE_PROVIDER_SITE_OTHER): Payer: BC Managed Care – PPO | Admitting: Psychiatry

## 2020-03-02 DIAGNOSIS — F411 Generalized anxiety disorder: Secondary | ICD-10-CM

## 2020-03-02 DIAGNOSIS — F331 Major depressive disorder, recurrent, moderate: Secondary | ICD-10-CM
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
## 2020-03-08 DIAGNOSIS — Z0001 Encounter for general adult medical examination with abnormal findings: Secondary | ICD-10-CM | POA: Diagnosis not present

## 2020-03-08 DIAGNOSIS — E559 Vitamin D deficiency, unspecified: Secondary | ICD-10-CM | POA: Diagnosis not present

## 2020-03-08 DIAGNOSIS — E039 Hypothyroidism, unspecified: Secondary | ICD-10-CM | POA: Diagnosis not present

## 2020-03-08 DIAGNOSIS — E785 Hyperlipidemia, unspecified: Secondary | ICD-10-CM | POA: Diagnosis not present

## 2020-03-16 ENCOUNTER — Other Ambulatory Visit: Payer: Self-pay

## 2020-03-16 ENCOUNTER — Ambulatory Visit (INDEPENDENT_AMBULATORY_CARE_PROVIDER_SITE_OTHER): Payer: BC Managed Care – PPO | Admitting: Psychiatry

## 2020-03-16 DIAGNOSIS — F411 Generalized anxiety disorder: Secondary | ICD-10-CM
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
## 2020-03-29 ENCOUNTER — Other Ambulatory Visit: Payer: Self-pay

## 2020-03-29 ENCOUNTER — Encounter (HOSPITAL_COMMUNITY): Payer: Self-pay | Admitting: Psychiatry

## 2020-03-29 ENCOUNTER — Telehealth (INDEPENDENT_AMBULATORY_CARE_PROVIDER_SITE_OTHER): Payer: BC Managed Care – PPO | Admitting: Psychiatry

## 2020-03-29 DIAGNOSIS — F411 Generalized anxiety disorder: Secondary | ICD-10-CM | POA: Diagnosis not present

## 2020-03-29 DIAGNOSIS — F331 Major depressive disorder, recurrent, moderate: Secondary | ICD-10-CM | POA: Diagnosis not present

## 2020-03-29 MED ORDER — VENLAFAXINE HCL ER 75 MG PO CP24: 225.0000 mg | ORAL_CAPSULE | Freq: Every day | ORAL | 0 refills | Status: DC

## 2020-03-29 MED ORDER — DIAZEPAM 5 MG PO TABS: 5.0000 mg | ORAL_TABLET | Freq: Two times a day (BID) | ORAL | 1 refills | Status: DC | PRN

## 2020-03-29 MED ORDER — BUSPIRONE HCL 15 MG PO TABS: 15.0000 mg | ORAL_TABLET | Freq: Two times a day (BID) | ORAL | 0 refills | Status: DC

## 2020-03-29 NOTE — Progress Notes (Signed)
Virtual Visit via Video Note  I connected with [name] on 03/29/20 at  8:30 AM EDT by phone. We were unable to connect by a video enabled telemedicine application. I verified that I am speaking with the correct person using two identifiers.  Location: Patient: home Provider: office   I discussed the limitations of evaluation and management by telemedicine and the availability of in person appointments. The patient expressed understanding and agreed to proceed.  History of Present Illness: [name] reports her anxiety is very high. She notes that Ativan 1mg  is not effective. She went up to 3mg  while at vacation and it still wasn't that effective. [name] went up to 5mg  and found that helped to calm her down. At home her anxiety is manageable. [name] is working on it in therapy. Work is overwhelming. 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 [name] is not calm. She can't remain patient and has racing thoughts. It feeds on itself and makes her anxiety even worse. She will then become irritable and will sometimes yell. This makes her makes her mad at herself. Sometimes she is so overwhelmed she will cry.  She is trying to using coping skills and prn meds. [name] is fed up and this is not typical for her. Her depression is overall manageable. The depression improved significantly with the increase in Effexor. Sleep is unchanged but she doesn't think it is related to anxiety. Her PCP is sending her to ENT for sleep issues. [name] denies SI/HI.     Observations/Objective:  General Appearance: unable to assess  Eye Contact:  unable to assess  Speech:  Clear and Coherent and Normal Rate  Volume:  Normal  Mood:  Anxious  Affect:  Congruent  Thought Process:  Coherent and Descriptions of Associations: Circumstantial  Orientation:  Full (Time, Place, and Person)  Thought Content:  Rumination  Suicidal Thoughts:  No  Homicidal Thoughts:  No  Memory:  Immediate;    Good  Judgement:  Fair  Insight:  Present  Psychomotor Activity: unable to assess  Concentration:  Concentration: Good  Recall:  Good  Fund of Knowledge:  Good  Language:  Good  Akathisia:  unable to assess  Handed:  Right  AIMS (if indicated):     Assets:  Communication Skills Desire for Improvement Financial Resources/Insurance Housing Resilience Social Support Talents/Skills Transportation Vocational/Educational  ADL's:  unable to assess  Cognition:  WNL  Sleep:         Assessment and Plan:  GAD; MDD- recurrent, moderate  D/c Ativan  Start trial of Valium 5mg  po qD prn anxiety  Buspar 15mg  po BID  Effexor XR 225mg  po qD  Encouraged to continue therapy  Ineffective trials- Vistaril, Seroquel, Ativan  Follow Up Instructions: In 4-6 weeks or sooner if needed   I discussed the assessment and treatment plan with the patient. The patient was provided an opportunity to ask questions and all were answered. The patient agreed with the plan and demonstrated an understanding of the instructions.   The patient was advised to call back or seek an in-person evaluation if the symptoms worsen or if the condition fails to improve as anticipated.  I provided 25 minutes of non-face-to-face time during this encounter.   Anita Maxwell, MD

## 2020-03-30 ENCOUNTER — Other Ambulatory Visit: Payer: Self-pay

## 2020-03-30 ENCOUNTER — Ambulatory Visit (HOSPITAL_COMMUNITY): Payer: BC Managed Care – PPO | Admitting: Psychiatry

## 2020-04-13 ENCOUNTER — Ambulatory Visit (HOSPITAL_COMMUNITY): Payer: BC Managed Care – PPO | Admitting: Psychiatry

## 2020-04-27 ENCOUNTER — Ambulatory Visit (INDEPENDENT_AMBULATORY_CARE_PROVIDER_SITE_OTHER): Payer: BC Managed Care – PPO | Admitting: Psychiatry

## 2020-04-27 ENCOUNTER — Other Ambulatory Visit: Payer: Self-pay

## 2020-04-27 DIAGNOSIS — F411 Generalized anxiety disorder: Secondary | ICD-10-CM
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
## 2020-05-03 ENCOUNTER — Telehealth (INDEPENDENT_AMBULATORY_CARE_PROVIDER_SITE_OTHER): Payer: BC Managed Care – PPO | Admitting: Psychiatry

## 2020-05-03 ENCOUNTER — Other Ambulatory Visit: Payer: Self-pay

## 2020-05-03 DIAGNOSIS — F411 Generalized anxiety disorder: Secondary | ICD-10-CM

## 2020-05-03 DIAGNOSIS — F331 Major depressive disorder, recurrent, moderate: Secondary | ICD-10-CM

## 2020-05-03 MED ORDER — BUSPIRONE HCL 15 MG PO TABS: 15.0000 mg | ORAL_TABLET | Freq: Two times a day (BID) | ORAL | 0 refills | Status: DC

## 2020-05-03 MED ORDER — DIAZEPAM 10 MG PO TABS: 10.0000 mg | ORAL_TABLET | Freq: Two times a day (BID) | ORAL | 1 refills | Status: DC | PRN

## 2020-05-03 NOTE — Progress Notes (Signed)
Virtual Visit via Telephone Note  I connected with [name] on 05/03/20 at  8:30 AM EDT by telephone and verified that I am speaking with the correct person using two identifiers.  Location: Patient: driving in car Provider: office   I discussed the limitations, risks, security and privacy concerns of performing an evaluation and management service by telephone and the availability of in person appointments. I also discussed with the patient that there may be a patient responsible charge related to this service. The patient expressed understanding and agreed to proceed.   History of Present Illness: [name] reports that she is still significant anxiety at work. The Valium helped a little but didn't make her feel calm enough. On good days she is fine. Her frustration tolerance is low and on days when she is dealing with multiple children having outbursts she is very anxious. Despite having help in the room she is still overwhelmed. At times [name] feels hopeless about finding a medication to help deal with her anxiety. Her depression is overall stable. She denies SI/HI.    Observations/Objective:  General Appearance: unable to assess  Eye Contact:  unable to assess  Speech:  Clear and Coherent and Normal Rate  Volume:  Normal  Mood:  Anxious  Affect:  Congruent  Thought Process:  Coherent and Descriptions of Associations: Circumstantial  Orientation:  Full (Time, Place, and Person)  Thought Content:  Logical  Suicidal Thoughts:  No  Homicidal Thoughts:  No  Memory:  Immediate;   Good  Judgement:  Good  Insight:  Good  Psychomotor Activity: unable to assess  Concentration:  Concentration: Good  Recall:  Good  Fund of Knowledge:  Good  Language:  Good  Akathisia:  unable to assess  Handed:  Right  AIMS (if indicated):     Assets:  Communication Skills Desire for Improvement Financial Resources/Insurance Housing Social  Support Talents/Skills Transportation Vocational/Educational  ADL's:  unable to assess  Cognition:  WNL  Sleep:          Assessment and Plan: GAD; MDD- recurrent, moderate  Increase Valium to 10mg  po qD prn anxiety  Buspar 15mg  po BID  Effexor XR 225mg  po qD  Encouraged to continue therapy  Failed- Vistaril, Seroquel, Ativan   Follow Up Instructions: In 4-6 weeks or sooner if needed   I discussed the assessment and treatment plan with the patient. The patient was provided an opportunity to ask questions and all were answered. The patient agreed with the plan and demonstrated an understanding of the instructions.   The patient was advised to call back or seek an in-person evaluation if the symptoms worsen or if the condition fails to improve as anticipated.  I provided 15 this encounter.   , MD

## 2020-05-08 DIAGNOSIS — R0982 Postnasal drip: Secondary | ICD-10-CM | POA: Insufficient documentation

## 2020-05-08 DIAGNOSIS — J343 Hypertrophy of nasal turbinates: Secondary | ICD-10-CM | POA: Diagnosis not present

## 2020-05-25 DIAGNOSIS — E559 Vitamin D deficiency, unspecified: Secondary | ICD-10-CM | POA: Diagnosis not present

## 2020-05-25 DIAGNOSIS — E785 Hyperlipidemia, unspecified: Secondary | ICD-10-CM | POA: Diagnosis not present

## 2020-05-25 DIAGNOSIS — E039 Hypothyroidism, unspecified: Secondary | ICD-10-CM | POA: Diagnosis not present

## 2020-05-25 LAB — LIPID PANEL
Cholesterol: 193 (ref 0–200)
HDL: 42 (ref 35–70)
LDL Cholesterol: 100
Triglycerides: 102 (ref 40–160)

## 2020-05-25 LAB — TSH: TSH: 2.21 (ref 0.41–5.90)

## 2020-05-29 ENCOUNTER — Ambulatory Visit (INDEPENDENT_AMBULATORY_CARE_PROVIDER_SITE_OTHER): Payer: BC Managed Care – PPO | Admitting: Adult Health

## 2020-05-29 ENCOUNTER — Other Ambulatory Visit: Payer: Self-pay

## 2020-05-29 ENCOUNTER — Encounter: Payer: Self-pay | Admitting: Adult Health

## 2020-05-29 VITALS — BP 131/81 | HR 94 | Ht 65.0 in | Wt >= 6400 oz

## 2020-05-29 DIAGNOSIS — N898 Other specified noninflammatory disorders of vagina: Secondary | ICD-10-CM | POA: Insufficient documentation

## 2020-05-29 DIAGNOSIS — N907 Vulvar cyst: Secondary | ICD-10-CM | POA: Insufficient documentation

## 2020-05-29 DIAGNOSIS — N920 Excessive and frequent menstruation with regular cycle: Secondary | ICD-10-CM | POA: Diagnosis not present

## 2020-05-29 MED ORDER — NORETHINDRONE ACETATE 5 MG PO TABS: ORAL_TABLET | ORAL | 3 refills | Status: DC

## 2020-05-29 MED ORDER — FLUCONAZOLE 150 MG PO TABS
ORAL_TABLET | ORAL | 1 refills | Status: DC
Start: 2020-05-29 — End: 2020-08-30

## 2020-05-29 NOTE — Progress Notes (Signed)
  Subjective:     Patient ID: [name], female   DOB: 1976-01-31, 44 y.o.   MRN: [id number]  HPI [name] is a 44 year old white female, married, back on follow up on taking aygestin for  Menorrhagia and it has helped. Has had some itching and feels bumps, and noticed ammonia odor. PCP is [facility].  Review of Systems  Bleeding has stopped with aygestin Feels anxious, sees PCP and therapist,was wondering if related to hormones and menopause or thyroid  Has ammonia odor when pees Has bumps left labia Has had some itching near clit Reviewed past medical,surgical, social and family history. Reviewed medications and allergies.     Objective:   Physical Exam BP 131/81 (BP Location: Left Arm, Patient Position: Sitting, Cuff Size: Normal)   Pulse 94   Ht 5\' 5"  (1.651 m)   Wt (!) 487 lb (220.9 kg)   LMP  (LMP Unknown)   BMI 81.04 kg/m  Skin warm and dry.Pelvic: external genitalia, has several epidermal cysts left inner labia, when squeezed has cheesy material expressed,to almost resolved, and some redness near clitoral area. She could not pee today in office.  Examination chaperoned by LPN     Assessment:     1. Menorrhagia with regular cycle Continue aygestin Meds ordered this encounter  Medications  . norethindrone (AYGESTIN) 5 MG tablet    Sig: Take 2 daily    Dispense:  60 tablet    Refill:  3    Order Specific Question:   Supervising Provider    Answer:   EURE, LUTHER H [[id number]]  . fluconazole (DIFLUCAN) 150 MG tablet    Sig: Take 1 now and repeat in 3 days    Dispense:  2 tablet    Refill:  1    Order Specific Question:   Supervising Provider    Answer:   Malachy Mood, LUTHER H [[id number]]    2. Epidermal cyst of vulva Can gently squeeze as needed, but they are not harmful   3. Itching in the vaginal area Will rx diflucan     Plan:    Has appt with PCP in am  I gave her a cup, can bring urine by  Follow up with me in 3 months

## 2020-05-30 DIAGNOSIS — F321 Major depressive disorder, single episode, moderate: Secondary | ICD-10-CM | POA: Diagnosis not present

## 2020-05-30 DIAGNOSIS — E039 Hypothyroidism, unspecified: Secondary | ICD-10-CM | POA: Diagnosis not present

## 2020-05-30 DIAGNOSIS — E559 Vitamin D deficiency, unspecified: Secondary | ICD-10-CM | POA: Diagnosis not present

## 2020-05-30 DIAGNOSIS — E785 Hyperlipidemia, unspecified: Secondary | ICD-10-CM | POA: Diagnosis not present

## 2020-05-31 ENCOUNTER — Telehealth (INDEPENDENT_AMBULATORY_CARE_PROVIDER_SITE_OTHER): Payer: BC Managed Care – PPO | Admitting: Psychiatry

## 2020-05-31 ENCOUNTER — Other Ambulatory Visit: Payer: Self-pay

## 2020-05-31 DIAGNOSIS — R454 Irritability and anger: Secondary | ICD-10-CM

## 2020-05-31 DIAGNOSIS — F331 Major depressive disorder, recurrent, moderate: Secondary | ICD-10-CM

## 2020-05-31 MED ORDER — DIVALPROEX SODIUM ER 500 MG PO TB24: 500.0000 mg | ORAL_TABLET | Freq: Every day | ORAL | 1 refills | Status: DC

## 2020-05-31 NOTE — Progress Notes (Signed)
Virtual Visit via Telephone Note  I connected with [name] on 05/31/20 at  8:15 AM EDT by telephone and verified that I am speaking with the correct person using two identifiers.  Location: Patient: home Provider: office   I discussed the limitations, risks, security and privacy concerns of performing an evaluation and management service by telephone and the availability of in person appointments. I also discussed with the patient that there may be a patient responsible charge related to this service. The patient expressed understanding and agreed to proceed.   History of Present Illness: [name] reports that she is having mood swings from being ok to extremely irritable. She feels this is a better description than saying she is anxious. The length of the irritability lasts from minutes to hours. When the stressor is large than she is on edge for the rest of the day. [name] has a lot of anxiety about talking to parents when there is an issue. The Valium 10mg  helped a little to calm her but not enough. It also made her sleepy. [name] has a lot of anxiety about going into work. She admits that year has been stressful on the kids and her due to COVID.  In the past she was able to control her response better. She wants to be calm and not overly irritated with the kids. Her depression is overall stable. She does get down sometimes but it is manageable. She denies SI/HI.    Observations/Objective:  General Appearance: unable to assess  Eye Contact:  unable to assess  Speech:  Clear and Coherent and Normal Rate  Volume:  Normal  Mood:  Anxious and Irritable  Affect:  Congruent  Thought Process:  Coherent and Descriptions of Associations: Circumstantial  Orientation:  Full (Time, Place, and Person)  Thought Content:  Rumination  Suicidal Thoughts:  No  Homicidal Thoughts:  No  Memory:  Immediate;   Good  Judgement:  Good  Insight:  Good  Psychomotor Activity: unable to assess   Concentration:  Concentration: Good  Recall:  Good  Fund of Knowledge:  Good  Language:  Good  Akathisia:  unable to assess  Handed:  Right  AIMS (if indicated):     Assets:  Communication Skills Desire for Improvement Financial Resources/Insurance Housing Social Support Talents/Skills Transportation Vocational/Educational  ADL's:  unable to assess  Cognition:  WNL  Sleep:         Assessment and Plan:  GAD; MDD- recurrent, moderate  Valium 10mg  po qD prn anxiety  Buspar 15mg  BID  Effexor XR 225mg  po qD  Start Depakote ER 500mg  po qD  Encouraged to continue therapy  Failed- Vistaril, Seroquel, Ativan, Valium   Follow Up Instructions: In 1- 2 months or sooner if needed   I discussed the assessment and treatment plan with the patient. The patient was provided an opportunity to ask questions and all were answered. The patient agreed with the plan and demonstrated an understanding of the instructions.   The patient was advised to call back or seek an in-person evaluation if the symptoms worsen or if the condition fails to improve as anticipated.  I provided 20 minutes of non-face-to-face time during this encounter.   Anita Maxwell, MD

## 2020-07-17 ENCOUNTER — Ambulatory Visit (INDEPENDENT_AMBULATORY_CARE_PROVIDER_SITE_OTHER): Payer: BC Managed Care – PPO | Admitting: "Endocrinology

## 2020-07-17 ENCOUNTER — Encounter: Payer: Self-pay | Admitting: "Endocrinology

## 2020-07-17 ENCOUNTER — Other Ambulatory Visit: Payer: Self-pay

## 2020-07-17 VITALS — BP 152/89 | HR 84 | Ht 65.0 in | Wt >= 6400 oz

## 2020-07-17 DIAGNOSIS — E039 Hypothyroidism, unspecified: Secondary | ICD-10-CM

## 2020-07-17 DIAGNOSIS — E782 Mixed hyperlipidemia: Secondary | ICD-10-CM | POA: Diagnosis not present

## 2020-07-17 DIAGNOSIS — E559 Vitamin D deficiency, unspecified: Secondary | ICD-10-CM | POA: Diagnosis not present

## 2020-07-17 NOTE — Patient Instructions (Signed)

## 2020-07-17 NOTE — Progress Notes (Signed)
Endocrinology Consult Note                                         07/17/2020, 9:47 PM   [name] is a 44 y.o.-year-old female patient being seen in consultation for hypothyroidism referred by [facility].   Past Medical History:  Diagnosis Date  . Allergy   . Anxiety   . Arthritis   . Asthma   . Breast nodule 01/02/2015  . Depression   . Fatigue   . GERD (gastroesophageal reflux disease)   . Herpes   . HPV in female   . Obesity   . PMDD (premenstrual dysphoric disorder)   . Thyroid disease    hypothryoidism  . Trauma   . Vaginal Pap smear, abnormal   . Vitamin D deficiency     Past Surgical History:  Procedure Laterality Date  . CESAREAN SECTION    . CHOLECYSTECTOMY    . TONSILECTOMY/ADENOIDECTOMY WITH MYRINGOTOMY    . WISDOM TOOTH EXTRACTION      Social History   Socioeconomic History  . Marital status: Married    Spouse name: Not on file  . Number of children: 2  . Years of education: Not on file  . Highest education level: Associate degree: occupational, Hotel manager, or vocational program  Occupational History  . Not on file  Tobacco Use  . Smoking status: Former Research scientist (life sciences)  . Smokeless tobacco: Never Used  . Tobacco comment: light smoker as a teenager   Vaping Use  . Vaping Use: Never used  Substance and Sexual Activity  . Alcohol use: Yes    Comment: occ  . Drug use: No  . Sexual activity: Yes    Birth control/protection: Pill  Other Topics Concern  . Not on file  Social History Narrative   Social Hx:   Current living situation- living in [place] with husband, son is 50/50 custody with biological father, daughter lives with pt's mom in [place]. Pt lives close to her mom   Raised in [place] by mom and dad   Siblings- 2 brothers and pt is the youngest. She is 10 yrs younger than her youngest brother   Schooling- associates degree in early childhood    Married- yes - currently in 25rd marriage   Kids- 2 from 1st marriage      Legal issues- denies   Social Determinants of Radio broadcast assistant Strain:   . Difficulty of Paying Living Expenses: Not on file  Food Insecurity:   . Worried About Charity fundraiser in the Last Year: Not on file  . Ran Out of Food in the Last Year: Not on file  Transportation Needs:   . Lack of Transportation (Medical): Not on file  . Lack of Transportation (Non-Medical): Not on file  Physical Activity:   . Days of Exercise per Week: Not on file  . Minutes of Exercise per Session: Not on file  Stress:   . Feeling of Stress : Not on file  Social Connections:   . Frequency of Communication with Friends and Family: Not on file  . Frequency of Social Gatherings with Friends and Family: Not on file  . Attends Religious Services: Not on file  . Active Member of Clubs or Organizations: Not on file  . Attends Archivist Meetings: Not on file  . Marital Status: Not on file    Family History  Problem Relation Age of Onset  . Hypertension Mother   . Arthritis Mother   . Anxiety disorder Mother   . Asthma Father   . COPD Father   . Arthritis Father   . Chronic bronchitis Father   . Hypertension Father   . Hyperlipidemia Father   . Anxiety disorder Father   . Depression Father   . Alcohol abuse Father   . Drug abuse Brother   . Alcohol abuse Brother   . Hypertension Brother   . Mental illness Brother   . Depression Brother   . Cancer Maternal Aunt   . Cancer Maternal Uncle   . Cancer Paternal Aunt   . Cancer Paternal Uncle   . Asthma Maternal Grandmother   . Arthritis Maternal Grandmother   . Hypertension Maternal Grandmother   . Congestive Heart Failure Maternal Grandmother   . Diabetes Maternal Grandfather   . Stroke Maternal Grandfather   . ADD / ADHD Son   . Hyperlipidemia Brother   . Hearing loss Brother   . Asthma Brother   . Learning disabilities Brother      Outpatient Encounter Medications as of 07/17/2020  Medication Sig  . augmented betamethasone dipropionate (DIPROLENE-AF) 0.05 % cream   . busPIRone (BUSPAR) 15 MG tablet Take 1 tablet (15 mg total) by mouth 2 (two) times daily.  . calcium-vitamin D (OSCAL WITH D) 500-200 MG-UNIT tablet Take 1 tablet by mouth.  . cetirizine (ZYRTEC) 10 MG tablet Take 10 mg by mouth daily.  . divalproex (DEPAKOTE ER) 500 MG 24 hr tablet Take 1 tablet (500 mg total) by mouth daily.  Marland Kitchen levalbuterol (XOPENEX HFA) 45 MCG/ACT inhaler Inhale into the lungs every 4 (four) hours as needed for wheezing.  Marland Kitchen levothyroxine (SYNTHROID) 50 MCG tablet Take 50 mcg by mouth daily before breakfast.  . levothyroxine (SYNTHROID, LEVOTHROID) 200 MCG tablet Take 250 mcg by mouth daily.   . montelukast (SINGULAIR) 10 MG tablet Take 10 mg by mouth daily.   . Multiple Vitamins-Minerals (WOMENS MULTI VITAMIN & MINERAL PO) Take by mouth daily.  . norethindrone (AYGESTIN) 5 MG tablet Take 2 daily  . venlafaxine XR (EFFEXOR-XR) 75 MG 24 hr capsule Take 3 capsules (225 mg total) by mouth daily with breakfast.  . diazepam (VALIUM) 10 MG tablet Take 1 tablet (10 mg total) by mouth 2 (two) times daily as needed for anxiety. (Patient not taking: Reported on 07/17/2020)  . fluconazole (DIFLUCAN) 150 MG tablet Take 1 now and repeat in 3 days (Patient not taking: Reported on 07/17/2020)  . Misc Natural Products (CRANBERRY/PROBIOTIC PO) Take by mouth daily. Cranberry/Probiotic/Prebiotic Blend-2 daily (Patient not taking: Reported on 07/17/2020)  . [DISCONTINUED] phentermine 37.5 MG capsule Take 37.5 mg by mouth every morning. (Patient not taking: Reported on 05/29/2020)   No facility-administered encounter medications on file as of 07/17/2020.    ALLERGIES: Allergies  Allergen Reactions  . Latex   . Naproxen   . Shrimp [Shellfish Allergy]    VACCINATION STATUS: Immunization History  Administered Date(s)  Administered  . Moderna SARS-COVID-2  Vaccination 12/12/2019, 01/11/2020     HPI    [name]  is a patient with the above medical history. she was diagnosed  with hypothyroidism at approximate age of 74 years which required treatment with levothyroxine.   - she was given various doses of levothyroxine over the years, currently on 250 micrograms. she reports compliance to this medication:  Taking it daily on empty stomach  with water, separated by >30 minutes before breakfast and other medications , and by at least 4 hours from   calcium, iron, PPIs, multivitamins .  I reviewed patient's  thyroid tests: TSH was 2.1 -Has multiple complaints including progressive weight gain, fatigue, depression, mood swings/disorder.   Pt denies feeling nodules in neck, hoarseness, dysphagia/odynophagia, SOB with lying down.  She does not recall any prior history of thyroid ultrasound.  Her EMR review did not reveal any imaging studies.  she has family history of  thyroid disorders in her uncle who has an identified thyroid dysfunction.  No family history of thyroid cancer.  No history of  radiation therapy to head or neck. No recent use of iodine supplements. She is on polypharmacy including psychotropics: BuSpar 15 mg twice daily, Depakote 500 mg p.o. daily, Effexor 225 mg p.o. daily. Her husband who is accompanying her to clinic admits that patient has loud snoring.  Reportedly, she underwent sleep studies which did not reveal sleep apnea.   ROS:  Constitutional: + weight gain, + fatigue, no subjective hyperthermia, no subjective hypothermia Eyes: no blurry vision, no xerophthalmia ENT: no sore throat, no nodules palpated in throat, no dysphagia/odynophagia, no hoarseness Cardiovascular: no Chest Pain, no Shortness of Breath, no palpitations, no leg swelling Respiratory: no cough, no SOB Gastrointestinal: no Nausea/Vomiting/Diarhhea Musculoskeletal: no muscle/joint aches Skin: no rashes Neurological: no tremors, no numbness, no  tingling, no dizziness Psychiatric: no depression, no anxiety   Physical Exam: BP (!) 152/89   Pulse 84   Ht $R'5\' 5"'Ci$  (1.651 m)   Wt (!) 498 lb (225.9 kg)   BMI 82.87 kg/m  Wt Readings from Last 3 Encounters:  07/17/20 (!) 498 lb (225.9 kg)  05/29/20 (!) 487 lb (220.9 kg)  02/21/20 (!) 458 lb 6.4 oz (207.9 kg)    Constitutional:  Body mass index is 82.87 kg/m., not in acute distress, normal state of mind Eyes: PERRLA, EOMI, no exophthalmos ENT: moist mucous membranes, + thyromegaly, no cervical lymphadenopathy Cardiovascular: normal precordial activity, Regular Rate and Rhythm, no Murmur/Rubs/Gallops Respiratory:  adequate breathing efforts, no gross chest deformity, Clear to auscultation bilaterally Gastrointestinal: + Obese, soft, Non -tender, No distension, Bowel Sounds present, No striae. Musculoskeletal: no gross deformities, strength intact in all four extremities Skin: moist, warm, no rashes Neurological: no tremor with outstretched hands, Deep tendon reflexes normal in all four extremities.   CMP ( most recent) CMP     Component Value Date/Time   NA 137 11/14/2009 1255   K 3.7 11/14/2009 1255   CL 102 11/14/2009 1255   CO2 29 11/14/2009 1255   GLUCOSE 89 11/14/2009 1255   BUN 5 (L) 11/14/2009 1255   CREATININE 0.71 11/14/2009 1255   CALCIUM 9.2 11/14/2009 1255   GFRNONAA >60 11/14/2009 1255   GFRAA  11/14/2009 1255    >60        The eGFR has been calculated using the MDRD equation. This calculation has not been validated in all clinical situations. eGFR's persistently <60 mL/min signify possible Chronic Kidney Disease.  Recent Results (from the past 2160 hour(s))  Lipid panel     Status: None   Collection Time: 05/25/20 12:00 AM  Result Value Ref Range   Triglycerides 102 40 - 160   Cholesterol 193 0 - 200   HDL 42 35 - 70   LDL Cholesterol 100     Comment: ? 127  TSH     Status: None   Collection Time: 05/25/20 12:00 AM  Result Value Ref Range    TSH 2.21 0.41 - 5.90       ASSESSMENT: 1. Hypothyroidism 2.  Morbid obesity 3.  Vitamin D  Deficiency 4.  Hypertension  PLAN:    Patient with long-standing hypothyroidism, on levothyroxine therapy. On physical exam , patient  does  have  gross goiter without neck compression symptoms.  She will benefit from baseline thyroid ultrasound.  In light of her high-dose levothyroxine, she will need full profile thyroid function test before dose adjustment.  I advised her to continue levothyroxine 250 mcg p.o. daily before breakfast.  - We discussed about correct intake of levothyroxine, at fasting, with water, separated by at least 30 minutes from breakfast, and separated by more than 4 hours from calcium, iron, multivitamins, acid reflux medications (PPIs). -Patient is made aware of the fact that thyroid hormone replacement is needed for life, dose to be adjusted by periodic monitoring of thyroid function tests.  She will have labs including TSH, free T4/free T3, and antithyroid antibodies.  Vitamin D deficiency: She is advised to continue her vitamin D supplements with vitamin D3 5000 units daily.  Is also on Os-Cal 500-200 mg.  Daily.  Reguarding  her morbid obesity: This is the most significant health risk for her.  She will benefit the most from weight loss.  Her weight gain seems to be driven by excessive caloric intake.  She will have a.m. cortisol along with her labs for thyroid. - she  admits there is a room for improvement in her diet and drink choices. -  Suggestion is made for her to avoid simple carbohydrates  from her diet including Cakes, Sweet Desserts / Pastries, Ice Cream, Soda (diet and regular), Sweet Tea, Candies, Chips, Cookies, Sweet Pastries,  Store Bought Juices, Alcohol in Excess of  1-2 drinks a day, Artificial Sweeteners, Coffee Creamer, and "Sugar-free" Products. This will help patient to have stable blood glucose profile and potentially avoid unintended weight  gain.  -Patient is a good candidate for surgical weight management if she passes screening by appropriate specialties.   Her blood pressure this morning was elevated at 152/89.  Patient denies any history of hypertension in the past.  Will be approached for treatment if her next visit blood pressure remains above 130/80.    - Time spent with the patient: 60 minutes, of which >50% was spent in obtaining information about her symptoms, reviewing her previous labs, evaluations, and treatments, counseling her about her hypothyroidism, obesity, vitamin D deficiency, elevated blood pressure, and developing a plan to confirm the diagnosis and long term treatment as necessary. Please refer to " Patient Self Inventory" in the Media  tab for reviewed elements of pertinent patient history.  Roberts Gaudy Whittaker participated in the discussions, expressed understanding, and voiced agreement with the above plans.  All questions were answered to her satisfaction. she is encouraged to contact clinic should she have any questions or concerns prior to her return visit.  Return in about 1 week (around 07/24/2020) for F/U with Pre-visit Labs, Thyroid /  Neck Ultrasound.  Glade Lloyd, MD [facility] [address] [place], [place] [address] Phone: [phone]  Fax: [phone]   07/17/2020, 9:47 PM  This note was partially dictated with voice recognition software. Similar sounding words can be transcribed inadequately or may not  be corrected upon review.

## 2020-07-18 ENCOUNTER — Telehealth: Payer: Self-pay

## 2020-07-18 NOTE — Telephone Encounter (Signed)
Returned call to pt , she is going to reschedule appt for u/s and advise if she needs to change f/u

## 2020-07-18 NOTE — Telephone Encounter (Signed)
Can you call pt regarding her ultrasound. She said she has not heard from anyone

## 2020-07-19 ENCOUNTER — Other Ambulatory Visit: Payer: Self-pay

## 2020-07-19 ENCOUNTER — Telehealth (HOSPITAL_COMMUNITY): Payer: BC Managed Care – PPO | Admitting: Psychiatry

## 2020-07-19 DIAGNOSIS — E039 Hypothyroidism, unspecified: Secondary | ICD-10-CM | POA: Diagnosis not present

## 2020-07-19 NOTE — Progress Notes (Unsigned)
Better anxiety at work - not getting as easily upset since starting depakote  In March something happened in her body - mental health, weight gain, worsening energy levels. Could be hormonal, pero menopause, need SE, electrolyte and vitamin imbalance.  Frustrated because nothing is helping despite working with multiple doctors. Feels the doctors are not listening and focus on her weight. She is now working with an endocrinologist. This all contributes to her depression. It makes her feel hopeless about getting  Push through during the week and does what she needs to do. On weekends she is drained. She is depressed, unmotivated, crying spells, emotionally sensitive, low energy and staying in bed or on the couch. Her sleep is poor. She gets 8 hrs but it is not restful. She was checked out for sleep apnea and it was negative. She admits to having thoughts of SIB randomly- cut stomach off. [name] has a history of SIB by cutting in her teenage years. She will not do it because it is not ok. [name] also has random passive thoughts of death. She denies SI/HI.   P: increase buspar D/c benzo Refill meds  Nov 18 @ 8.15a  25 min

## 2020-07-20 ENCOUNTER — Ambulatory Visit (HOSPITAL_COMMUNITY): Payer: BC Managed Care – PPO

## 2020-07-20 LAB — CORTISOL-AM, BLOOD: Cortisol - AM: 9.2 ug/dL (ref 6.2–19.4)

## 2020-07-20 LAB — TSH: TSH: 2.74 u[IU]/mL (ref 0.450–4.500)

## 2020-07-20 LAB — THYROID PEROXIDASE ANTIBODY: Thyroperoxidase Ab SerPl-aCnc: 228 IU/mL — ABNORMAL HIGH (ref 0–34)

## 2020-07-20 LAB — T4, FREE: Free T4: 1.23 ng/dL (ref 0.82–1.77)

## 2020-07-20 LAB — THYROGLOBULIN ANTIBODY: Thyroglobulin Antibody: 828.2 IU/mL — ABNORMAL HIGH (ref 0.0–0.9)

## 2020-07-25 ENCOUNTER — Ambulatory Visit (INDEPENDENT_AMBULATORY_CARE_PROVIDER_SITE_OTHER): Payer: BC Managed Care – PPO | Admitting: Psychiatry

## 2020-07-25 ENCOUNTER — Other Ambulatory Visit: Payer: Self-pay

## 2020-07-25 DIAGNOSIS — F331 Major depressive disorder, recurrent, moderate: Secondary | ICD-10-CM | POA: Diagnosis not present

## 2020-07-25 DIAGNOSIS — F411 Generalized anxiety disorder: Secondary | ICD-10-CM | POA: Diagnosis not present
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
## 2020-07-26 ENCOUNTER — Ambulatory Visit: Payer: BC Managed Care – PPO | Admitting: "Endocrinology

## 2020-07-26 ENCOUNTER — Ambulatory Visit (HOSPITAL_COMMUNITY)
Admission: RE | Admit: 2020-07-26 | Discharge: 2020-07-26 | Disposition: A | Discharge status: Home / Self Care | Payer: BC Managed Care – PPO | Source: Ambulatory Visit | Attending: "Endocrinology | Admitting: "Endocrinology

## 2020-07-26 ENCOUNTER — Other Ambulatory Visit: Payer: Self-pay

## 2020-07-26 DIAGNOSIS — E039 Hypothyroidism, unspecified: Secondary | ICD-10-CM | POA: Diagnosis not present

## 2020-07-30 ENCOUNTER — Telehealth (HOSPITAL_COMMUNITY): Payer: Self-pay | Admitting: *Deleted

## 2020-07-30 NOTE — Telephone Encounter (Signed)
Pt called stating that she needs her refills and that you had discussed increasing Buspar as well, which is reflected in your note. Please review. Thanks.

## 2020-07-31 ENCOUNTER — Telehealth (HOSPITAL_COMMUNITY): Payer: Self-pay | Admitting: *Deleted

## 2020-07-31 ENCOUNTER — Encounter: Payer: Self-pay | Admitting: "Endocrinology

## 2020-07-31 ENCOUNTER — Other Ambulatory Visit: Payer: Self-pay

## 2020-07-31 ENCOUNTER — Ambulatory Visit (INDEPENDENT_AMBULATORY_CARE_PROVIDER_SITE_OTHER): Payer: BC Managed Care – PPO | Admitting: "Endocrinology

## 2020-07-31 VITALS — BP 140/74 | HR 80 | Ht 65.5 in | Wt >= 6400 oz

## 2020-07-31 DIAGNOSIS — E039 Hypothyroidism, unspecified: Secondary | ICD-10-CM

## 2020-07-31 MED ORDER — LEVOTHYROXINE SODIUM 75 MCG PO TABS: 75.0000 ug | ORAL_TABLET | Freq: Every day | ORAL | 1 refills | Status: DC

## 2020-07-31 NOTE — Patient Instructions (Signed)

## 2020-07-31 NOTE — Progress Notes (Signed)
07/31/2020, 3:31 PM  Endocrinology follow-up note   [name] is a 44 y.o.-year-old female patient being seen in follow-up after she was seen in consultation for hypothyroidism referred by [facility].   Past Medical History:  Diagnosis Date  . Allergy   . Anxiety   . Arthritis   . Asthma   . Breast nodule 01/02/2015  . Depression   . Fatigue   . GERD (gastroesophageal reflux disease)   . Herpes   . HPV in female   . Obesity   . PMDD (premenstrual dysphoric disorder)   . Thyroid disease    hypothryoidism  . Trauma   . Vaginal Pap smear, abnormal   . Vitamin D deficiency     Past Surgical History:  Procedure Laterality Date  . CESAREAN SECTION    . CHOLECYSTECTOMY    . TONSILECTOMY/ADENOIDECTOMY WITH MYRINGOTOMY    . WISDOM TOOTH EXTRACTION      Social History   Socioeconomic History  . Marital status: Married    Spouse name: Not on file  . Number of children: 2  . Years of education: Not on file  . Highest education level: Associate degree: occupational, Hotel manager, or vocational program  Occupational History  . Not on file  Tobacco Use  . Smoking status: Former Research scientist (life sciences)  . Smokeless tobacco: Never Used  . Tobacco comment: light smoker as a teenager   Vaping Use  . Vaping Use: Never used  Substance and Sexual Activity  . Alcohol use: Yes    Comment: occ  . Drug use: No  . Sexual activity: Yes    Birth control/protection: Pill  Other Topics Concern  . Not on file  Social History Narrative   Social Hx:   Current living situation- living in [place] with husband, son is 50/50 custody with biological father, daughter lives with pt's mom in [place]. Pt lives close to her mom   Raised in [place] by mom and dad   Siblings- 2 brothers and pt is the youngest. She is 62 yrs younger than her youngest brother   Schooling-  associates degree in early childhood   Married- yes - currently in 11rd marriage   Kids- 2 from 1st marriage      Legal issues- denies   Social Determinants of Radio broadcast assistant Strain:   . Difficulty of Paying Living Expenses: Not on file  Food Insecurity:   . Worried About Charity fundraiser in the Last Year: Not on file  . Ran Out of Food in the Last Year: Not on file  Transportation Needs:   . Lack of Transportation (Medical): Not on file  . Lack of Transportation (Non-Medical): Not on file  Physical Activity:   . Days of Exercise per Week: Not on file  . Minutes of Exercise per Session: Not on file  Stress:   .  Feeling of Stress : Not on file  Social Connections:   . Frequency of Communication with Friends and Family: Not on file  . Frequency of Social Gatherings with Friends and Family: Not on file  . Attends Religious Services: Not on file  . Active Member of Clubs or Organizations: Not on file  . Attends Archivist Meetings: Not on file  . Marital Status: Not on file    Family History  Problem Relation Age of Onset  . Hypertension Mother   . Arthritis Mother   . Anxiety disorder Mother   . Asthma Father   . COPD Father   . Arthritis Father   . Chronic bronchitis Father   . Hypertension Father   . Hyperlipidemia Father   . Anxiety disorder Father   . Depression Father   . Alcohol abuse Father   . Drug abuse Brother   . Alcohol abuse Brother   . Hypertension Brother   . Mental illness Brother   . Depression Brother   . Cancer Maternal Aunt   . Cancer Maternal Uncle   . Cancer Paternal Aunt   . Cancer Paternal Uncle   . Asthma Maternal Grandmother   . Arthritis Maternal Grandmother   . Hypertension Maternal Grandmother   . Congestive Heart Failure Maternal Grandmother   . Diabetes Maternal Grandfather   . Stroke Maternal Grandfather   . ADD / ADHD Son   . Hyperlipidemia Brother   . Hearing loss Brother   . Asthma Brother   .  Learning disabilities Brother     Outpatient Encounter Medications as of 07/31/2020  Medication Sig  . levothyroxine (SYNTHROID) 200 MCG tablet Take 200 mcg by mouth daily before breakfast.  . augmented betamethasone dipropionate (DIPROLENE-AF) 0.05 % cream   . busPIRone (BUSPAR) 15 MG tablet Take 1 tablet (15 mg total) by mouth 2 (two) times daily.  . calcium-vitamin D (OSCAL WITH D) 500-200 MG-UNIT tablet Take 1 tablet by mouth.  . cetirizine (ZYRTEC) 10 MG tablet Take 10 mg by mouth daily.  . divalproex (DEPAKOTE ER) 500 MG 24 hr tablet Take 1 tablet (500 mg total) by mouth daily.  . fluconazole (DIFLUCAN) 150 MG tablet Take 1 now and repeat in 3 days (Patient not taking: Reported on 07/17/2020)  . levalbuterol (XOPENEX HFA) 45 MCG/ACT inhaler Inhale into the lungs every 4 (four) hours as needed for wheezing.  Marland Kitchen levothyroxine (SYNTHROID) 75 MCG tablet Take 1 tablet (75 mcg total) by mouth daily.  . Misc Natural Products (CRANBERRY/PROBIOTIC PO) Take by mouth daily. Cranberry/Probiotic/Prebiotic Blend-2 daily (Patient not taking: Reported on 07/17/2020)  . montelukast (SINGULAIR) 10 MG tablet Take 10 mg by mouth daily.   . Multiple Vitamins-Minerals (WOMENS MULTI VITAMIN & MINERAL PO) Take by mouth daily.  . norethindrone (AYGESTIN) 5 MG tablet Take 2 daily  . venlafaxine XR (EFFEXOR-XR) 75 MG 24 hr capsule Take 3 capsules (225 mg total) by mouth daily with breakfast.  . [DISCONTINUED] diazepam (VALIUM) 10 MG tablet Take 1 tablet (10 mg total) by mouth 2 (two) times daily as needed for anxiety. (Patient not taking: Reported on 07/17/2020)  . [DISCONTINUED] levothyroxine (SYNTHROID) 50 MCG tablet Take 50 mcg by mouth daily before breakfast.  . [DISCONTINUED] levothyroxine (SYNTHROID, LEVOTHROID) 200 MCG tablet Take 250 mcg by mouth daily.    No facility-administered encounter medications on file as of 07/31/2020.    ALLERGIES: Allergies  Allergen Reactions  . Latex   . Naproxen   .  Shrimp [employer]  Allergy]    VACCINATION STATUS: Immunization History  Administered Date(s) Administered  . Moderna SARS-COVID-2 Vaccination 12/12/2019, 01/11/2020     HPI    [name]  is a patient with the above medical history. she was diagnosed  with hypothyroidism at approximate age of 71 years which required treatment with levothyroxine.   - she was given various doses of levothyroxine over the years, currently on 250 micrograms. she reports compliance to this medication: She has no new complaints today.   -Her recent labs were discussed with her. -Her major concern remains to be progressive weight gain, more rapidly after March of this year.  She also has chronic complaints of fatigue, depression, mood swings/disorder.  Patient with prior history of eating disorders.   she has family history of  thyroid disorders in her uncle who has an identified thyroid dysfunction.  No family history of thyroid cancer.  No history of  radiation therapy to head or neck. No recent use of iodine supplements. She is on polypharmacy including psychotropics: BuSpar 15 mg twice daily, Depakote 500 mg p.o. daily, Effexor 225 mg p.o. daily. Her husband who is accompanying her to clinic admits that patient has loud snoring.  Reportedly, she underwent sleep studies which did not reveal sleep apnea.   ROS:  Constitutional: + weight gain, + fatigue, no subjective hyperthermia, no subjective hypothermia Eyes: no blurry vision, no xerophthalmia ENT: no sore throat, no nodules palpated in throat, no dysphagia/odynophagia, no hoarseness Cardiovascular: no Chest Pain, no Shortness of Breath, no palpitations, no leg swelling Respiratory: no cough, no SOB Gastrointestinal: no Nausea/Vomiting/Diarhhea Musculoskeletal: no muscle/joint aches Skin: no rashes Neurological: no tremors, no numbness, no tingling, no dizziness Psychiatric: no depression, no anxiety   Physical Exam: BP 140/74   Pulse 80    Ht 5' 5.5" (1.664 m)   Wt (!) 500 lb 6.4 oz (227 kg)   BMI 82.00 kg/m  Wt Readings from Last 3 Encounters:  07/31/20 (!) 500 lb 6.4 oz (227 kg)  07/17/20 (!) 498 lb (225.9 kg)  05/29/20 (!) 487 lb (220.9 kg)     CMP ( most recent) CMP     Component Value Date/Time   NA 137 11/14/2009 1255   K 3.7 11/14/2009 1255   CL 102 11/14/2009 1255   CO2 29 11/14/2009 1255   GLUCOSE 89 11/14/2009 1255   BUN 5 (L) 11/14/2009 1255   CREATININE 0.71 11/14/2009 1255   CALCIUM 9.2 11/14/2009 1255   GFRNONAA >60 11/14/2009 1255   GFRAA  11/14/2009 1255    >60        The eGFR has been calculated using the MDRD equation. This calculation has not been validated in all clinical situations. eGFR's persistently <60 mL/min signify possible Chronic Kidney Disease.   Recent Results (from the past 2160 hour(s))  Lipid panel     Status: None   Collection Time: 05/25/20 12:00 AM  Result Value Ref Range   Triglycerides 102 40 - 160   Cholesterol 193 0 - 200   HDL 42 35 - 70   LDL Cholesterol 100     Comment: ? 127  TSH     Status: None   Collection Time: 05/25/20 12:00 AM  Result Value Ref Range   TSH 2.21 0.41 - 5.90  TSH     Status: None   Collection Time: 07/19/20  8:24 AM  Result Value Ref Range   TSH 2.740 0.450 - 4.500 uIU/mL  T4, free  Status: None   Collection Time: 07/19/20  8:24 AM  Result Value Ref Range   Free T4 1.23 0.82 - 1.77 ng/dL  Thyroid peroxidase antibody     Status: Abnormal   Collection Time: 07/19/20  8:24 AM  Result Value Ref Range   Thyroperoxidase Ab SerPl-aCnc 228 (H) 0 - 34 IU/mL  Thyroglobulin antibody     Status: Abnormal   Collection Time: 07/19/20  8:24 AM  Result Value Ref Range   Thyroglobulin Antibody 828.2 (H) 0.0 - 0.9 IU/mL    Comment: Thyroglobulin Antibody measured by Beckman Coulter Methodology  Cortisol-am, blood     Status: None   Collection Time: 07/19/20  8:24 AM  Result Value Ref Range   Cortisol - AM 9.2 6.2 - 19.4 ug/dL     Thyroid ultrasound on July 26, 2020: Right lobe 4.8 cm with no nodules. Left lobe 4.5 cm with no nodules.   ASSESSMENT: 1. Hypothyroidism 2.  Morbid obesity 3.  Vitamin D  Deficiency 4.  Hypertension  PLAN:  The etiology of her hypothyroidism is confirmed to be Hashimoto's thyroiditis.  Her ultrasound is unremarkable, will not need any biopsy or surgery at this time. -Her previsit thyroid function tests are such that she will benefit from slight increase in her levothyroxine dose.  -I discussed and increase her levothyroxine to 270 mcg p.o. daily before breakfast.  - We discussed about the correct intake of her thyroid hormone, on empty stomach at fasting, with water, separated by at least 30 minutes from breakfast and other medications,  and separated by more than 4 hours from calcium, iron, multivitamins, acid reflux medications (PPIs). -Patient is made aware of the fact that thyroid hormone replacement is needed for life, dose to be adjusted by periodic monitoring of thyroid function tests.  Vitamin D deficiency: She is advised to continue her vitamin D supplements with vitamin D3 5000 units daily.  Is also on Os-Cal 500-200 mg.  Daily.  Reguarding  her morbid obesity: Her a.m. cortisol is normal. This is the most significant health risk for her.  Her weight gain seems to be driven by excessive caloric intake worsened by slowed metabolism likely due to perimenopausal syndrome.  This patient with Hashimoto's thyroiditis is at risk of premature menopause.    She will still benefit from dietary management to lose weight.    - she  admits there is a room for improvement in her diet and drink choices. -  Suggestion is made for her to avoid simple carbohydrates  from her diet including Cakes, Sweet Desserts / Pastries, Ice Cream, Soda (diet and regular), Sweet Tea, Candies, Chips, Cookies, Sweet Pastries,  Store Bought Juices, Alcohol in Excess of  1-2 drinks a day, Artificial  Sweeteners, Coffee Creamer, and "Sugar-free" Products. This will help patient to have stable blood glucose profile and potentially avoid unintended weight gain.  -Patient is a good candidate for surgical weight management, however, patient is hesitant to consider surgery at this time.  Her blood pressure is controlled to target.  She is advised to maintain close follow-up with her PMD.    - Time spent on this patient care encounter:  30 minutes of which 50% was spent in  counseling and the rest reviewing  her current and  previous labs / studies and medications  doses and developing a plan for long term care. Roberts Gaudy Batch  participated in the discussions, expressed understanding, and voiced agreement with the above plans.  All questions were answered  to her satisfaction. she is encouraged to contact clinic should she have any questions or concerns prior to her return visit.   Return in about 4 months (around 12/01/2020) for F/U with Pre-visit Labs.  Glade Lloyd, MD [facility] [address] [place], [place] [address] Phone: [phone]  Fax: [phone]   07/31/2020, 3:31 PM  This note was partially dictated with voice recognition software. Similar sounding words can be transcribed inadequately or may not  be corrected upon review.

## 2020-08-01 ENCOUNTER — Other Ambulatory Visit (HOSPITAL_COMMUNITY): Payer: Self-pay | Admitting: Psychiatry

## 2020-08-01 DIAGNOSIS — F331 Major depressive disorder, recurrent, moderate: Secondary | ICD-10-CM

## 2020-08-01 DIAGNOSIS — R454 Irritability and anger: Secondary | ICD-10-CM

## 2020-08-03 ENCOUNTER — Other Ambulatory Visit (HOSPITAL_COMMUNITY): Payer: Self-pay | Admitting: Psychiatry

## 2020-08-03 DIAGNOSIS — F331 Major depressive disorder, recurrent, moderate: Secondary | ICD-10-CM

## 2020-08-03 DIAGNOSIS — F411 Generalized anxiety disorder: Secondary | ICD-10-CM

## 2020-08-03 MED ORDER — VENLAFAXINE HCL ER 75 MG PO CP24: 225.0000 mg | ORAL_CAPSULE | Freq: Every day | ORAL | 0 refills | Status: DC

## 2020-08-03 MED ORDER — BUSPIRONE HCL 10 MG PO TABS: 20.0000 mg | ORAL_TABLET | Freq: Two times a day (BID) | ORAL | 0 refills | Status: DC

## 2020-08-03 NOTE — Telephone Encounter (Signed)
Erroneous encounter

## 2020-08-03 NOTE — Telephone Encounter (Signed)
done

## 2020-08-08 ENCOUNTER — Ambulatory Visit (HOSPITAL_COMMUNITY): Payer: BC Managed Care – PPO | Admitting: Psychiatry

## 2020-08-22 ENCOUNTER — Ambulatory Visit (HOSPITAL_COMMUNITY): Payer: BC Managed Care – PPO | Admitting: Psychiatry

## 2020-08-28 ENCOUNTER — Ambulatory Visit: Payer: BC Managed Care – PPO | Admitting: Orthopaedic Surgery

## 2020-08-29 ENCOUNTER — Encounter: Payer: Self-pay | Admitting: Orthopaedic Surgery

## 2020-08-29 ENCOUNTER — Ambulatory Visit: Payer: BC Managed Care – PPO

## 2020-08-29 ENCOUNTER — Other Ambulatory Visit: Payer: Self-pay

## 2020-08-29 ENCOUNTER — Ambulatory Visit: Payer: BC Managed Care – PPO | Admitting: Adult Health

## 2020-08-29 ENCOUNTER — Ambulatory Visit: Payer: BC Managed Care – PPO | Admitting: Orthopaedic Surgery

## 2020-08-29 VITALS — BP 157/83 | HR 87 | Ht 65.5 in

## 2020-08-29 DIAGNOSIS — G8929 Other chronic pain: Secondary | ICD-10-CM

## 2020-08-29 DIAGNOSIS — M25562 Pain in left knee: Secondary | ICD-10-CM

## 2020-08-29 DIAGNOSIS — Z6841 Body Mass Index (BMI) 40.0 and over, adult: Secondary | ICD-10-CM | POA: Diagnosis not present

## 2020-08-29 MED ORDER — HYDROCODONE-ACETAMINOPHEN 7.5-325 MG PO TABS
1.0000 | ORAL_TABLET | ORAL | 0 refills | Status: AC | PRN
Start: 2020-08-29 — End: 2020-09-03

## 2020-08-29 NOTE — Patient Instructions (Addendum)
Take 3 over the counter Ibuprofen (200 mg) 3 times daily  Use Aspercreme, Biofreeze or Voltaren gel over the counter 2-3 times daily make sure you rub it in well each time you use it.    Ice the knee today and tomorrow after the injection   Stay out of work until  Monday Nov 22nd

## 2020-08-29 NOTE — Progress Notes (Signed)
Subjective:    Patient ID: [name], female    DOB: [birth year], 44 y.o.   MRN: [id number]  HPI She has been having increasing pain of the left knee over the last three weeks. She has swelling, popping and giving way.  She has no trauma.  She works at day care.  She has tried rest, elevation, ibuprofen and ice.  It still hurts.  She is no improving.   Review of Systems  Constitutional: Positive for activity change.  Respiratory: Positive for shortness of breath.   Musculoskeletal: Positive for arthralgias, gait problem and joint swelling.  All other systems reviewed and are negative.  For Review of Systems, all other systems reviewed and are negative.  The following is a summary of the past history medically, past history surgically, known current medicines, social history and family history.  This information is gathered electronically by the computer from prior information and documentation.  I review this each visit and have found including this information at this point in the chart is beneficial and informative.   Past Medical History:  Diagnosis Date  . Allergy   . Anxiety   . Arthritis   . Asthma   . Breast nodule 01/02/2015  . Depression   . Fatigue   . GERD (gastroesophageal reflux disease)   . Herpes   . HPV in female   . Obesity   . PMDD (premenstrual dysphoric disorder)   . Thyroid disease    hypothryoidism  . Trauma   . Vaginal Pap smear, abnormal   . Vitamin D deficiency     Past Surgical History:  Procedure Laterality Date  . CESAREAN SECTION    . CHOLECYSTECTOMY    . TONSILECTOMY/ADENOIDECTOMY WITH MYRINGOTOMY    . WISDOM TOOTH EXTRACTION      Current Outpatient Medications on File Prior to Visit  Medication Sig Dispense Refill  . augmented betamethasone dipropionate (DIPROLENE-AF) 0.05 % cream     . busPIRone (BUSPAR) 10 MG tablet Take 2 tablets (20 mg total) by mouth 2 (two) times daily. 360 tablet 0  . calcium-vitamin D (OSCAL WITH D)  500-200 MG-UNIT tablet Take 1 tablet by mouth.    . cetirizine (ZYRTEC) 10 MG tablet Take 10 mg by mouth daily.    . divalproex (DEPAKOTE ER) 500 MG 24 hr tablet TAKE 1 TABLET(500 MG) BY MOUTH DAILY 30 tablet 1  . levalbuterol (XOPENEX HFA) 45 MCG/ACT inhaler Inhale into the lungs every 4 (four) hours as needed for wheezing.    Marland Kitchen levothyroxine (SYNTHROID) 200 MCG tablet Take 200 mcg by mouth daily before breakfast.    . levothyroxine (SYNTHROID) 75 MCG tablet Take 1 tablet (75 mcg total) by mouth daily. 90 tablet 1  . MELATONIN PO Take by mouth at bedtime.    . Misc Natural Products (CRANBERRY/PROBIOTIC PO) Take by mouth daily. Cranberry/Probiotic/Prebiotic Blend-2 daily    . montelukast (SINGULAIR) 10 MG tablet Take 10 mg by mouth daily.     . Multiple Vitamins-Minerals (WOMENS MULTI VITAMIN & MINERAL PO) Take by mouth daily.    . norethindrone (AYGESTIN) 5 MG tablet Take 2 daily 60 tablet 3  . venlafaxine XR (EFFEXOR-XR) 75 MG 24 hr capsule Take 3 capsules (225 mg total) by mouth daily with breakfast. 270 capsule 0  . fluconazole (DIFLUCAN) 150 MG tablet Take 1 now and repeat in 3 days (Patient not taking: Reported on 07/17/2020) 2 tablet 1   No current facility-administered medications on file prior to visit.  Social History   Socioeconomic History  . Marital status: Married    Spouse name: Not on file  . Number of children: 2  . Years of education: Not on file  . Highest education level: Associate degree: occupational, Scientist, product/process development, or vocational program  Occupational History  . Not on file  Tobacco Use  . Smoking status: Former Games developer  . Smokeless tobacco: Never Used  . Tobacco comment: light smoker as a teenager   Vaping Use  . Vaping Use: Never used  Substance and Sexual Activity  . Alcohol use: Yes    Comment: occ  . Drug use: No  . Sexual activity: Yes    Birth control/protection: Pill  Other Topics Concern  . Not on file  Social History Narrative   Social Hx:    Current living situation- living in [place] with husband, son is 50/50 custody with biological father, daughter lives with pt's mom in [place]. Pt lives close to her mom   Raised in [place] by mom and dad   Siblings- 2 brothers and pt is the youngest. She is 48 yrs younger than her youngest brother   Schooling- associates degree in early childhood   Married- yes - currently in 3rd marriage   Kids- 2 from 1st marriage      Legal issues- denies   Social Determinants of Corporate investment banker Strain:   . Difficulty of Paying Living Expenses: Not on file  Food Insecurity:   . Worried About Programme researcher, broadcasting/film/video in the Last Year: Not on file  . Ran Out of Food in the Last Year: Not on file  Transportation Needs:   . Lack of Transportation (Medical): Not on file  . Lack of Transportation (Non-Medical): Not on file  Physical Activity:   . Days of Exercise per Week: Not on file  . Minutes of Exercise per Session: Not on file  Stress:   . Feeling of Stress : Not on file  Social Connections:   . Frequency of Communication with Friends and Family: Not on file  . Frequency of Social Gatherings with Friends and Family: Not on file  . Attends Religious Services: Not on file  . Active Member of Clubs or Organizations: Not on file  . Attends Banker Meetings: Not on file  . Marital Status: Not on file  Intimate Partner Violence:   . Fear of Current or Ex-Partner: Not on file  . Emotionally Abused: Not on file  . Physically Abused: Not on file  . Sexually Abused: Not on file    Family History  Problem Relation Age of Onset  . Hypertension Mother   . Arthritis Mother   . Anxiety disorder Mother   . Asthma Father   . COPD Father   . Arthritis Father   . Chronic bronchitis Father   . Hypertension Father   . Hyperlipidemia Father   . Anxiety disorder Father   . Depression Father   . Alcohol abuse Father   . Drug abuse Brother   . Alcohol abuse Brother   .  Hypertension Brother   . Mental illness Brother   . Depression Brother   . Cancer Maternal Aunt   . Cancer Maternal Uncle   . Cancer Paternal Aunt   . Cancer Paternal Uncle   . Asthma Maternal Grandmother   . Arthritis Maternal Grandmother   . Hypertension Maternal Grandmother   . Congestive Heart Failure Maternal Grandmother   . Diabetes Maternal Grandfather   .  Stroke Maternal Grandfather   . ADD / ADHD Son   . Hyperlipidemia Brother   . Hearing loss Brother   . Asthma Brother   . Learning disabilities Brother     BP (!) 157/83   Pulse 87   Ht 5' 5.5" (1.664 m)   BMI 82.00 kg/m   Body mass index is 82 kg/m.      Objective:   Physical Exam Vitals reviewed. Exam conducted with a chaperone present.  Constitutional:      Appearance: She is well-developed.  HENT:     Head: Normocephalic and atraumatic.  Eyes:     Conjunctiva/sclera: Conjunctivae normal.     Pupils: Pupils are equal, round, and reactive to light.  Cardiovascular:     Rate and Rhythm: Normal rate and regular rhythm.  Pulmonary:     Effort: Pulmonary effort is normal.  Abdominal:     Palpations: Abdomen is soft.  Musculoskeletal:     Cervical back: Normal range of motion and neck supple.       Legs:  Skin:    General: Skin is warm and dry.  Neurological:     Mental Status: She is alert and oriented to person, place, and time.     Cranial Nerves: No cranial nerve deficit.     Motor: No abnormal muscle tone.     Coordination: Coordination normal.     Deep Tendon Reflexes: Reflexes are normal and symmetric. Reflexes normal.  Psychiatric:        Behavior: Behavior normal.        Thought Content: Thought content normal.        Judgment: Judgment normal.   x-rays were done of the left knee, reported separately.       Assessment & Plan:   Encounter Diagnoses  Name Primary?  . Chronic pain of left knee Yes  . Body mass index 70 and over, adult (HCC)   . Morbid obesity (HCC)    I will  have her continue the ibuprofen 600 mgm tid.  PROCEDURE NOTE:  The patient requests injections of the left knee , verbal consent was obtained.  The left knee was prepped appropriately after time out was performed.   Sterile technique was observed and injection of 1 cc of Depo-Medrol 40 mg with several cc's of plain xylocaine. Anesthesia was provided by ethyl chloride and a 20-gauge needle was used to inject the knee area. The injection was tolerated well.  A band aid dressing was applied.  The patient was advised to apply ice later today and tomorrow to the injection sight as needed.  I am concerned about meniscus tear.  I will begin PT.  She may need MRI.  Return in two weeks.  Call if any problem.  Precautions discussed.  I have reviewed the [place] Controlled Substance Reporting System web site prior to prescribing narcotic medicine for this patient.     Electronically Signed Darreld Mclean, MD 11/17/20213:23 PM

## 2020-08-30 ENCOUNTER — Telehealth (HOSPITAL_COMMUNITY): Payer: BC Managed Care – PPO | Admitting: Psychiatry

## 2020-08-30 DIAGNOSIS — R454 Irritability and anger: Secondary | ICD-10-CM

## 2020-08-30 DIAGNOSIS — F411 Generalized anxiety disorder: Secondary | ICD-10-CM

## 2020-08-30 DIAGNOSIS — F331 Major depressive disorder, recurrent, moderate: Secondary | ICD-10-CM

## 2020-08-30 MED ORDER — VENLAFAXINE HCL ER 75 MG PO CP24: 225.0000 mg | ORAL_CAPSULE | Freq: Every day | ORAL | 0 refills | Status: DC

## 2020-08-30 MED ORDER — BUSPIRONE HCL 10 MG PO TABS: 20.0000 mg | ORAL_TABLET | Freq: Two times a day (BID) | ORAL | 0 refills | Status: DC

## 2020-08-30 MED ORDER — DIVALPROEX SODIUM ER 500 MG PO TB24: ORAL_TABLET | ORAL | 0 refills | Status: DC

## 2020-08-30 NOTE — Progress Notes (Unsigned)
Virtual Visit via Telephone Note  I connected with Kensli Bowley Kruszka on 08/30/20 at  8:15 AM EST by telephone and verified that I am speaking with the correct person using two identifiers.  Location: Patient: home Provider: office   I discussed the limitations, risks, security and privacy concerns of performing an evaluation and management service by telephone and the availability of in person appointments. I also discussed with the patient that there may be a patient responsible charge related to this service. The patient expressed understanding and agreed to proceed.   History of Present Illness: "It seems to be better". The increase in the Buspar has helped with the depression. Rayana has noticed a decrease in the frequency of the really bad depression days. She has only had about 2 days over the last 4 weeks. Panhia is no longer having crying spells and weekends are not as bad. Amilia states work is going well despite being short staffed. She is handing things well.Gionna hurt her knee and is out of work for a few days per her doctor's order. Sleep is good with Melatonin. She is sleeping about 4-5 hrs/night. Her energy is generally low. Trayonna is having less hopeless feelings. She denies SI/HI. Her anxiety is more manageable. The only real issue is wearing a face mask which causes her to feel panic. Harlea is working with an   Observations/Objective:  General Appearance: unable to assess  Eye Contact:  unable to assess  Speech:  {Speech:22685}  Volume:  {Volume (PAA):22686}  Mood:  {BHH MOOD:22306}  Affect:  {Affect (PAA):22687}  Thought Process:  {Thought Process (PAA):22688}  Orientation:  {BHH ORIENTATION (PAA):22689}  Thought Content:  {Thought Content:22690}  Suicidal Thoughts:  {ST/HT (PAA):22692}  Homicidal Thoughts:  {ST/HT (PAA):22692}  Memory:  {BHH MEMORY:22881}  Judgement:  {Judgement (PAA):22694}  Insight:  {Insight (PAA):22695}  Psychomotor Activity: unable to assess   Concentration:  {Concentration:21399}  Recall:  {BHH GOOD/FAIR/POOR:22877}  Fund of Knowledge:  {BHH GOOD/FAIR/POOR:22877}  Language:  {BHH GOOD/FAIR/POOR:22877}  Akathisia:  unable to assess  Handed:  {Handed:22697}  AIMS (if indicated):     Assets:  {Assets (PAA):22698}  ADL's:  unable to assess  Cognition:  {chl bhh cognition:304700322}  Sleep:         Assessment and Plan:  MDD- recurrent, moderate; GAD  Buspar 20mg  po BID  Depakote ER 500mg  po qD  Effexor XR 225mg  po qD   Follow Up Instructions: In 2-3 months or sooner if needed   I discussed the assessment and treatment plan with the patient. The patient was provided an opportunity to ask questions and all were answered. The patient agreed with the plan and demonstrated an understanding of the instructions.   The patient was advised to call back or seek an in-person evaluation if the symptoms worsen or if the condition fails to improve as anticipated.  I provided 15 minutes of non-face-to-face time during this encounter.   , MD

## 2020-09-13 ENCOUNTER — Encounter (HOSPITAL_COMMUNITY): Payer: Self-pay | Admitting: Physical Therapy

## 2020-09-13 ENCOUNTER — Ambulatory Visit (HOSPITAL_COMMUNITY): Payer: BC Managed Care – PPO | Attending: Orthopaedic Surgery | Admitting: Physical Therapy

## 2020-09-13 ENCOUNTER — Other Ambulatory Visit: Payer: Self-pay

## 2020-09-13 DIAGNOSIS — M6281 Muscle weakness (generalized): Secondary | ICD-10-CM | POA: Insufficient documentation

## 2020-09-13 DIAGNOSIS — G8929 Other chronic pain: Secondary | ICD-10-CM | POA: Diagnosis not present

## 2020-09-13 DIAGNOSIS — M25562 Pain in left knee: Secondary | ICD-10-CM | POA: Diagnosis not present

## 2020-09-13 DIAGNOSIS — R262 Difficulty in walking, not elsewhere classified: Secondary | ICD-10-CM | POA: Diagnosis not present

## 2020-09-13 NOTE — Therapy (Signed)
[facility] [address] [place], [place], [address] Phone: [phone]   Fax:  [phone]  Physical Therapy Evaluation  Patient Details  Name: [name] MRN: [id number] Date of Birth: December 24, 1975 Referring Provider (PT): Darreld Mclean   Encounter Date: 09/13/2020   PT End of Session - 09/13/20 0918    Visit Number 1    Number of Visits 8    Date for PT Re-Evaluation 10/11/20    Authorization Type [employer] Comm PPO  - no auth required, VL 30    Progress Note Due on Visit 10    PT Start Time 0920    PT Stop Time 1011    PT Time Calculation (min) 51 min    Activity Tolerance Patient tolerated treatment well    Behavior During Therapy [facility] for tasks assessed/performed           Past Medical History:  Diagnosis Date  . Allergy   . Anxiety   . Arthritis   . Asthma   . Breast nodule 01/02/2015  . Depression   . Fatigue   . GERD (gastroesophageal reflux disease)   . Herpes   . HPV in female   . Obesity   . PMDD (premenstrual dysphoric disorder)   . Thyroid disease    hypothryoidism  . Trauma   . Vaginal Pap smear, abnormal   . Vitamin D deficiency     Past Surgical History:  Procedure Laterality Date  . CESAREAN SECTION    . CHOLECYSTECTOMY    . TONSILECTOMY/ADENOIDECTOMY WITH MYRINGOTOMY    . WISDOM TOOTH EXTRACTION      There were no vitals filed for this visit.    Subjective Assessment - 09/13/20 1016    Subjective Patient reports she has been having left knee pain for the last 5 weeks. States it is hurting all around and the back of her knee. States she tried an injection and that didn't help. States that she started using a quad cane on the right side as her right knee started hurting. States that she has been trying ibuprofen and ice without benefit. States that she has to do PT in order to get MRI. States that she is taking 3-200mg  ibuprofens 2x/day but it is helping with the swelling but not pain. States  yesterday she was just walking (back at work), and all of a sudden she had a lot of snapping and popping and she felt her left knee collapse underneath her. 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 States that the giving out happens occasionally. Standing on it for extending period of times it acts up. States standing on it all day at work (works 8-9 hour day). Twisting and turning motions seem to be the most painful and squatting is not bet. Stairs are not good but they have never been good and have been painful previously (global diffuse pain). States no known injury but thinks she may have turned it funny.    Pertinent History obese, depression    Limitations Standing    Currently in Pain? Yes    Pain Score --   really bad   Pain Location Knee    Pain Orientation Left;Anterior;Posterior;Medial;Lateral    Aggravating Factors  standing long periods of time              [facility] PT Assessment - 09/13/20 0001      Assessment   Medical Diagnosis lef tknee pain    Referring Provider (PT) Darreld Mclean  Prior Therapy no      Balance Screen   Has the patient fallen in the past 6 months Yes    How many times? 3    Has the patient had a decrease in activity level because of a fear of falling?  Yes    Is the patient reluctant to leave their home because of a fear of falling?  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      Cognition   Overall Cognitive Status Within Functional Limits for tasks assessed       Observation/Other Assessments   Focus on Therapeutic Outcomes (FOTO)  30% function       ROM / Strength   AROM / PROM / Strength AROM;Strength      AROM   AROM Assessment Site Knee;Hip    Right/Left Hip Right;Left    Right/Left Knee Right;Left    Right Knee Extension 5   hyperextension   Right Knee Flexion 110    Left Knee Extension 5   hyperextension   Left Knee Flexion 102   painful with movement     Strength   Strength Assessment Site Knee;Hip    Right/Left Hip Right;Left    Right Hip Flexion 4/5   pain   Left Hip Flexion 3+/5   pain   Right/Left Knee Left;Right    Right Knee Flexion 4+/5    Right Knee Extension 5/5    Left Knee Flexion 4-/5   pain   Left Knee Extension 4-/5   pain     Palpation   Patella mobility --   pain with palpation at superior pole, patella alta,                     Objective measurements completed on examination: See above findings.       [facility] Adult PT Treatment/Exercise - 09/13/20 0001      Exercises   Exercises Knee/Hip      Knee/Hip Exercises: Supine   Straight Leg Raises 15 reps;AROM;Strengthening;Left   focus on neutral knee not hyperextended                 PT Education - 09/13/20 1022    Education Details on anatomy, POC, HEP, posture and hyperextension.    Person(s) Educated Patient    Methods Explanation    Comprehension Verbalized understanding            PT Short Term Goals - 09/13/20 1013      PT SHORT TERM GOAL #1   Title Patient will report at least 25% improvement in overall symptoms and/or function to demonstrate improved functional mobility    Time 2    Period Weeks    Status New    Target Date 09/27/20      PT SHORT TERM GOAL #2   Title Patient will be independent in self management strategies to improve quality  of life and functional outcomes.    Time 2    Period Weeks    Status New    Target Date 09/27/20             PT Long Term Goals - 09/13/20 1013      PT LONG TERM  GOAL #1   Title Patient will report at least 50% improvement in overall symptoms and/or function to demonstrate improved functional mobility    Time 4    Period Weeks    Status New    Target Date 10/11/20      PT LONG TERM GOAL #2   Title Patient will improve on FOTO score to meet predicted outcomes to demonstrate improved functional mobility.    Time 4    Period Weeks    Status New    Target Date 10/11/20      PT LONG TERM GOAL #3   Title Patient will be able to demonstrate painfree left knee ROM in supine.    Time 4    Period Weeks    Status New    Target Date 10/11/20                  Plan - 09/13/20 1017    Clinical Impression Statement Patient presents to therapy with reports of bilateral chronic knee pain but with recent increase in pain on left knee that has not resolved over the last 5 weeks with ice, rest and injection. Patient presents with hyperextension in both knee but is painful on the left with functional movements. Educated patient on "soft knee" and goal for PT. Answered all questions and patient would greatly benefit from skilled physical therapy to improve overall body awareness and standing posture (in knee) at work and home.    Personal Factors and Comorbidities Comorbidity 1;Comorbidity 2;Fitness    Comorbidities obese, history of bilateral knee pain    Examination-Activity Limitations Squat;Stairs;Stand;Transfers;Locomotion Level    Examination-Participation Restrictions Occupation;Laundry;Community Activity;Cleaning    Stability/Clinical Decision Making Stable/Uncomplicated    Clinical Decision Making Low    Rehab Potential Fair    PT Frequency 2x / week    PT Duration 4 weeks    PT Treatment/Interventions ADLs/Self Care Home Management;Aquatic Therapy;Electrical Stimulation;Cryotherapy;Moist Heat;Balance training;Therapeutic exercise;Therapeutic activities;Iontophoresis 4mg /ml Dexamethasone;Functional mobility training;Gait training;Stair  training;DME Instruction;Neuromuscular re-education;Patient/family education;Manual techniques;Dry needling;Passive range of motion;Joint Manipulations    PT Next Visit Plan Focus on quad strength and MONITOR for hyper-extension - goal to reduce hyperextension in functional activities.    PT Home Exercise Plan SLR WITHOUT hyper extension    Consulted and Agree with Plan of Care Patient           Patient will benefit from skilled therapeutic intervention in order to improve the following deficits and impairments:  Abnormal gait, Decreased endurance, Pain, Decreased strength, Decreased knowledge of use of DME, Decreased activity tolerance, Decreased balance, Decreased mobility, Difficulty walking, Improper body mechanics, Decreased range of motion  Visit Diagnosis: Chronic pain of left knee  Difficulty in walking, not elsewhere classified  Muscle weakness (generalized)     Problem List Patient Active Problem List   Diagnosis Date Noted  . Vitamin D deficiency 07/17/2020  . Mixed hyperlipidemia 07/17/2020  . Epidermal cyst of vulva 05/29/2020  . Itching in the vaginal area 05/29/2020  . Gums, bleeding 02/21/2020  . Screening for colorectal cancer 11/29/2019  . Encounter for gynecological examination with Papanicolaou smear of cervix 11/29/2019  . Morbid obesity (HCC) 11/29/2019  . PMS (premenstrual syndrome)  11/14/2019  . Dysmenorrhea 11/14/2019  . Menorrhagia with regular cycle 11/14/2019  . Breast nodule 01/02/2015  . Morbid obesity with BMI of 60.0-69.9, adult (HCC) 09/21/2014  . Obesity, BMI unknown 05/04/2014  . ASCUS with positive high risk HPV 02/22/2014  . Contraception management 01/06/2013  . Recurrent UTI 01/06/2013  . Hypothyroidism 11/01/2009  . ANXIETY DEPRESSION 11/01/2009  . ALLERGIC ASTHMA 11/01/2009  . HEMATOCHEZIA 11/01/2009  . 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 PERSONAL HX OTH ALLERG OTH THAN MEDICINAL AGTS 11/01/2009   10:23 AM, 09/13/20 Tereasa Coop, DPT Physical Therapy with [facility]  [phone] office  [facility] [address] [place], [place], [address] Phone: [phone]   Fax:  [phone]  Name: [name] MRN: [id number] Date of Birth: [birth year]

## 2020-09-14 ENCOUNTER — Ambulatory Visit (HOSPITAL_COMMUNITY): Payer: BC Managed Care – PPO | Admitting: Psychiatry

## 2020-09-18 ENCOUNTER — Encounter: Payer: Self-pay | Admitting: Orthopaedic Surgery

## 2020-09-18 ENCOUNTER — Ambulatory Visit: Payer: BC Managed Care – PPO | Admitting: Orthopaedic Surgery

## 2020-09-18 ENCOUNTER — Other Ambulatory Visit: Payer: Self-pay

## 2020-09-18 VITALS — BP 101/61 | HR 79 | Ht 64.0 in | Wt >= 6400 oz

## 2020-09-18 DIAGNOSIS — Z6841 Body Mass Index (BMI) 40.0 and over, adult: Secondary | ICD-10-CM | POA: Diagnosis not present

## 2020-09-18 DIAGNOSIS — G8929 Other chronic pain: Secondary | ICD-10-CM | POA: Diagnosis not present

## 2020-09-18 DIAGNOSIS — M25562 Pain in left knee: Secondary | ICD-10-CM | POA: Diagnosis not present

## 2020-09-18 MED ORDER — HYDROCODONE-ACETAMINOPHEN 5-325 MG PO TABS
ORAL_TABLET | ORAL | 0 refills | Status: DC
Start: 2020-09-18 — End: 2020-11-01

## 2020-09-18 NOTE — Progress Notes (Signed)
Patient [name], female DOB:1976-01-08, 44 y.o. FAO:[id number]  Chief Complaint  Patient presents with  . Knee Pain    left knee pain worse, hurts worse in evening,     HPI  [name] is a 44 y.o. female who has continued pain of the left knee.  She is not improved.  She has been to PT which made it worse.  She is using a cane now.  The ibuprofen has not helped.  I will begin Norco.  I will try to get MRI.  She will need open unit.   Body mass index is 85.82 kg/m.  The patient meets the AMA guidelines for Morbid (severe) obesity with a BMI > 40.0 and I have recommended weight loss.   ROS  Review of Systems  Constitutional: Positive for activity change.  Respiratory: Positive for shortness of breath.   Musculoskeletal: Positive for arthralgias, gait problem and joint swelling.  All other systems reviewed and are negative.   All other systems reviewed and are negative.  The following is a summary of the past history medically, past history surgically, known current medicines, social history and family history.  This information is gathered electronically by the computer from prior information and documentation.  I review this each visit and have found including this information at this point in the chart is beneficial and informative.    Past Medical History:  Diagnosis Date  . Allergy   . Anxiety   . Arthritis   . Asthma   . Breast nodule 01/02/2015  . Depression   . Fatigue   . GERD (gastroesophageal reflux disease)   . Herpes   . HPV in female   . Obesity   . PMDD (premenstrual dysphoric disorder)   . Thyroid disease    hypothryoidism  . Trauma   . Vaginal Pap smear, abnormal   . Vitamin D deficiency     Past Surgical History:  Procedure Laterality Date  . CESAREAN SECTION    . CHOLECYSTECTOMY    . TONSILECTOMY/ADENOIDECTOMY WITH MYRINGOTOMY    . WISDOM TOOTH EXTRACTION      Family History  Problem Relation Age of Onset  . Hypertension  Mother   . Arthritis Mother   . Anxiety disorder Mother   . Asthma Father   . COPD Father   . Arthritis Father   . Chronic bronchitis Father   . Hypertension Father   . Hyperlipidemia Father   . Anxiety disorder Father   . Depression Father   . Alcohol abuse Father   . Drug abuse Brother   . Alcohol abuse Brother   . Hypertension Brother   . Mental illness Brother   . Depression Brother   . Cancer Maternal Aunt   . Cancer Maternal Uncle   . Cancer Paternal Aunt   . Cancer Paternal Uncle   . Asthma Maternal Grandmother   . Arthritis Maternal Grandmother   . Hypertension Maternal Grandmother   . Congestive Heart Failure Maternal Grandmother   . Diabetes Maternal Grandfather   . Stroke Maternal Grandfather   . ADD / ADHD Son   . Hyperlipidemia Brother   . Hearing loss Brother   . Asthma Brother   . Learning disabilities Brother     Social History Social History   Tobacco Use  . Smoking status: Former Games developer  . Smokeless tobacco: Never Used  . Tobacco comment: light smoker as a teenager   Vaping Use  . Vaping Use: Never used  Substance  Use Topics  . Alcohol use: Yes    Comment: occ  . Drug use: No    Allergies  Allergen Reactions  . Latex   . Naproxen   . Shrimp [Shellfish Allergy] Other (See Comments)    Intolerance     Current Outpatient Medications  Medication Sig Dispense Refill  . augmented betamethasone dipropionate (DIPROLENE-AF) 0.05 % cream     . busPIRone (BUSPAR) 10 MG tablet Take 2 tablets (20 mg total) by mouth 2 (two) times daily. 360 tablet 0  . calcium-vitamin D (OSCAL WITH D) 500-200 MG-UNIT tablet Take 1 tablet by mouth.    . cetirizine (ZYRTEC) 10 MG tablet Take 10 mg by mouth daily.    . divalproex (DEPAKOTE ER) 500 MG 24 hr tablet TAKE 1 TABLET(500 MG) BY MOUTH DAILY 90 tablet 0  . ibuprofen (ADVIL) 600 MG tablet Take 600 mg by mouth 3 (three) times daily.    Marland Kitchen levalbuterol (XOPENEX HFA) 45 MCG/ACT inhaler Inhale into the lungs every  4 (four) hours as needed for wheezing.    Marland Kitchen levothyroxine (SYNTHROID) 200 MCG tablet Take 200 mcg by mouth daily before breakfast.    . levothyroxine (SYNTHROID) 75 MCG tablet Take 1 tablet (75 mcg total) by mouth daily. 90 tablet 1  . MELATONIN PO Take by mouth at bedtime.    . Misc Natural Products (CRANBERRY/PROBIOTIC PO) Take by mouth daily. Cranberry/Probiotic/Prebiotic Blend-2 daily    . montelukast (SINGULAIR) 10 MG tablet Take 10 mg by mouth daily.     . Multiple Vitamins-Minerals (WOMENS MULTI VITAMIN & MINERAL PO) Take by mouth daily.    . norethindrone (AYGESTIN) 5 MG tablet Take 2 daily 60 tablet 3  . venlafaxine XR (EFFEXOR-XR) 75 MG 24 hr capsule Take 3 capsules (225 mg total) by mouth daily with breakfast. 270 capsule 0  . HYDROcodone-acetaminophen (NORCO/VICODIN) 5-325 MG tablet One tablet every four hours as needed for acute pain.  Limit of five days per [place] statue. 30 tablet 0   No current facility-administered medications for this visit.     Physical Exam  Blood pressure 101/61, pulse 79, height 5\' 4"  (1.626 m), weight (!) 500 lb (226.8 kg).  Constitutional: overall normal hygiene, normal nutrition, well developed, normal grooming, normal body habitus. Assistive device:cane  Musculoskeletal: gait and station Limp left, muscle tone and strength are normal, no tremors or atrophy is present.  .  Neurological: coordination overall normal.  Deep tendon reflex/nerve stretch intact.  Sensation normal.  Cranial nerves II-XII intact.   Skin:   Normal overall no scars, lesions, ulcers or rashes. No psoriasis.  Psychiatric: Alert and oriented x 3.  Recent memory intact, remote memory unclear.  Normal mood and affect. Well groomed.  Good eye contact.  Cardiovascular: overall no swelling, no varicosities, no edema bilaterally, normal temperatures of the legs and arms, no clubbing, cyanosis and good capillary refill.  Lymphatic: palpation is normal.  Left knee is painful,  difficult to examine, ROM 0 to 95, crepitus, effusion, positive McMurray medially, medial joint pain, limp left.  All other systems reviewed and are negative   The patient has been educated about the nature of the problem(s) and counseled on treatment options.  The patient appeared to understand what I have discussed and is in agreement with it.  Encounter Diagnoses  Name Primary?  . Chronic pain of left knee Yes  . Body mass index 70 and over, adult (HCC)   . Morbid obesity (HCC)  PLAN Call if any problems.  Precautions discussed.  Continue current medications.   Return to clinic 2 weeks   The state narcotic site is down.  There is a [place] outage of many web services this afternoon including Surveyor, mining.  I did try to check it.  Norco given, #30.  Electronically Signed Anita Mclean, MD 12/7/20212:36 PM

## 2020-09-20 ENCOUNTER — Encounter (HOSPITAL_COMMUNITY): Payer: Self-pay | Admitting: Physical Therapy

## 2020-09-20 ENCOUNTER — Ambulatory Visit (HOSPITAL_COMMUNITY): Payer: BC Managed Care – PPO | Admitting: Physical Therapy

## 2020-09-20 ENCOUNTER — Other Ambulatory Visit: Payer: Self-pay

## 2020-09-20 DIAGNOSIS — M6281 Muscle weakness (generalized): Secondary | ICD-10-CM

## 2020-09-20 DIAGNOSIS — M25562 Pain in left knee: Secondary | ICD-10-CM

## 2020-09-20 DIAGNOSIS — G8929 Other chronic pain: Secondary | ICD-10-CM | POA: Diagnosis not present

## 2020-09-20 DIAGNOSIS — R262 Difficulty in walking, not elsewhere classified: Secondary | ICD-10-CM | POA: Diagnosis not present

## 2020-09-20 NOTE — Patient Instructions (Signed)
Access Code: [email or web address] URL: [email or web address] Date: 09/20/2020 Prepared by: [facility] Raghav Verrilli  Exercises Seated Long Arc Quad - 1 x daily - 7 x weekly - 2 sets - 10 reps - 5 second hold

## 2020-09-20 NOTE — Therapy (Signed)
[facility] [address] [place], [place], [address] Phone: [phone]   Fax:  [phone]  Physical Therapy Treatment  Patient Details  Name: [name] MRN: [id number] Date of Birth: Aug 29, 1976 Referring Provider (PT): Darreld Mclean   Encounter Date: 09/20/2020   PT End of Session - 09/20/20 1047    Visit Number 2    Number of Visits 8    Date for PT Re-Evaluation 10/11/20    Authorization Type [employer] Comm PPO  - no auth required, VL 30    Progress Note Due on Visit 10    PT Start Time 1048    PT Stop Time 1127    PT Time Calculation (min) 39 min    Activity Tolerance Patient tolerated treatment well    Behavior During Therapy [facility] for tasks assessed/performed           Past Medical History:  Diagnosis Date  . Allergy   . Anxiety   . Arthritis   . Asthma   . Breast nodule 01/02/2015  . Depression   . Fatigue   . GERD (gastroesophageal reflux disease)   . Herpes   . HPV in female   . Obesity   . PMDD (premenstrual dysphoric disorder)   . Thyroid disease    hypothryoidism  . Trauma   . Vaginal Pap smear, abnormal   . Vitamin D deficiency     Past Surgical History:  Procedure Laterality Date  . CESAREAN SECTION    . CHOLECYSTECTOMY    . TONSILECTOMY/ADENOIDECTOMY WITH MYRINGOTOMY    . WISDOM TOOTH EXTRACTION      There were no vitals filed for this visit.   Subjective Assessment - 09/20/20 1048    Subjective Patient reports her knee has been in a lot more pain. She saw her MD who gave her some pain meds. She has tried to do the exercises some but did not do them a whole lot because of pain. She has had some bouts of buckling. She has been trying to not lock her knees. The muscles have been more sore but the knee have also been sore.    Pertinent History obese, depression    Limitations Standing    Currently in Pain? Yes    Pain Score 3    6/10 with walking at minimum   Pain Location Knee    Pain  Orientation Left                             [facility] Adult PT Treatment/Exercise - 09/20/20 0001      Knee/Hip Exercises: Seated   Long Arc Quad 10 reps;Left    Long Arc Quad Limitations 5 second holds 2 sets    Other Seated Knee/Hip Exercises hamstring iso 10x 10 second holds    Other Seated Knee/Hip Exercises heel and toe raises 20x      Knee/Hip Exercises: Supine   Heel Slides Left;2 sets;10 reps    Straight Leg Raises Left;2 sets;10 reps                  PT Education - 09/20/20 1047    Education Details Patient educated on HEP, exercise mechanics    Person(s) Educated Patient    Methods Explanation;Demonstration    Comprehension Verbalized understanding;Returned demonstration            PT Short Term Goals - 09/13/20 1013      PT  SHORT TERM GOAL #1   Title Patient will report at least 25% improvement in overall symptoms and/or function to demonstrate improved functional mobility    Time 2    Period Weeks    Status New    Target Date 09/27/20      PT SHORT TERM GOAL #2   Title Patient will be independent in self management strategies to improve quality of life and functional outcomes.    Time 2    Period Weeks    Status New    Target Date 09/27/20             PT Long Term Goals - 09/13/20 1013      PT LONG TERM GOAL #1   Title Patient will report at least 50% improvement in overall symptoms and/or function to demonstrate improved functional mobility    Time 4    Period Weeks    Status New    Target Date 10/11/20      PT LONG TERM GOAL #2   Title Patient will improve on FOTO score to meet predicted outcomes to demonstrate improved functional mobility.    Time 4    Period Weeks    Status New    Target Date 10/11/20      PT LONG TERM GOAL #3   Title Patient will be able to demonstrate painfree left knee ROM in supine.    Time 4    Period Weeks    Status New    Target Date 10/11/20                 Plan - 09/20/20  1047    Clinical Impression Statement Patient given cueing for not locking out knee and avoiding hyperextension with all exercises performed. She has c/o pain at one point during long arc quad which she feels every which she feels during eccentric phase. Patient completes SLR with good mechanics today with focus on avoiding hyperextension with cueing for initial quad set. Patient will continue to benefit from skilled physical therapy in order to reduce impairment and improve function.    Personal Factors and Comorbidities Comorbidity 1;Comorbidity 2;Fitness    Comorbidities obese, history of bilateral knee pain    Examination-Activity Limitations Squat;Stairs;Stand;Transfers;Locomotion Level    Examination-Participation Restrictions Occupation;Laundry;Community Activity;Cleaning    Stability/Clinical Decision Making Stable/Uncomplicated    Rehab Potential Fair    PT Frequency 2x / week    PT Duration 4 weeks    PT Treatment/Interventions ADLs/Self Care Home Management;Aquatic Therapy;Electrical Stimulation;Cryotherapy;Moist Heat;Balance training;Therapeutic exercise;Therapeutic activities;Iontophoresis 4mg /ml Dexamethasone;Functional mobility training;Gait training;Stair training;DME Instruction;Neuromuscular re-education;Patient/family education;Manual techniques;Dry needling;Passive range of motion;Joint Manipulations    PT Next Visit Plan Focus on quad strength and MONITOR for hyper-extension - goal to reduce hyperextension in functional activities.    PT Home Exercise Plan SLR WITHOUT hyper extension 12/9 LAQ    Consulted and Agree with Plan of Care Patient           Patient will benefit from skilled therapeutic intervention in order to improve the following deficits and impairments:  Abnormal gait,Decreased endurance,Pain,Decreased strength,Decreased knowledge of use of DME,Decreased activity tolerance,Decreased balance,Decreased mobility,Difficulty walking,Improper body mechanics,Decreased  range of motion  Visit Diagnosis: Chronic pain of left knee  Difficulty in walking, not elsewhere classified  Muscle weakness (generalized)     Problem List Patient Active Problem List   Diagnosis Date Noted  . Vitamin D deficiency 07/17/2020  . Mixed hyperlipidemia 07/17/2020  . Epidermal cyst of vulva 05/29/2020  . Itching in  the vaginal area 05/29/2020  . Gums, bleeding 02/21/2020  . Screening for colorectal cancer 11/29/2019  . Encounter for gynecological examination with Papanicolaou smear of cervix 11/29/2019  . Morbid obesity (HCC) 11/29/2019  . PMS (premenstrual syndrome) 11/14/2019  . Dysmenorrhea 11/14/2019  . Menorrhagia with regular cycle 11/14/2019  . Breast nodule 01/02/2015  . Morbid obesity with BMI of 60.0-69.9, adult (HCC) 09/21/2014  . Obesity, BMI unknown 05/04/2014  . ASCUS with positive high risk HPV 02/22/2014  . Contraception management 01/06/2013  . Recurrent UTI 01/06/2013  . Hypothyroidism 11/01/2009  . ANXIETY DEPRESSION 11/01/2009  . ALLERGIC ASTHMA 11/01/2009  . HEMATOCHEZIA 11/01/2009  .  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 PERSONAL HX OTH ALLERG OTH THAN MEDICINAL AGTS 11/01/2009    11:26 AM, 09/20/20 Wyman Songster PT, DPT Physical Therapist at [facility]   [facility] [address] [place], [place], [address] Phone: [phone]   Fax:  [phone]  Name: [name] MRN: [id number] Date of Birth: 01-30-1976

## 2020-09-21 ENCOUNTER — Telehealth: Payer: Self-pay

## 2020-09-21 ENCOUNTER — Other Ambulatory Visit: Payer: Self-pay | Admitting: "Endocrinology

## 2020-09-21 MED ORDER — LEVOTHYROXINE SODIUM 200 MCG PO TABS
200.0000 ug | ORAL_TABLET | Freq: Every day | ORAL | 1 refills | Status: DC
Start: 2020-09-21 — End: 2021-04-02

## 2020-09-21 NOTE — Telephone Encounter (Signed)
done

## 2020-09-21 NOTE — Telephone Encounter (Signed)
Pt needs refill on levothyroxine (SYNTHROID) 200 MCG tablet. [facility] on [address]

## 2020-09-23 ENCOUNTER — Other Ambulatory Visit: Payer: Self-pay | Admitting: Adult Health

## 2020-09-25 ENCOUNTER — Ambulatory Visit (HOSPITAL_COMMUNITY): Payer: BC Managed Care – PPO | Admitting: Physical Therapy

## 2020-09-25 ENCOUNTER — Telehealth (HOSPITAL_COMMUNITY): Payer: Self-pay | Admitting: Physical Therapy

## 2020-09-25 NOTE — Telephone Encounter (Signed)
pt called to cx today's appt due to she is still stuck in traffic

## 2020-09-27 ENCOUNTER — Ambulatory Visit (HOSPITAL_COMMUNITY): Payer: BC Managed Care – PPO | Admitting: Physical Therapy

## 2020-09-27 ENCOUNTER — Other Ambulatory Visit: Payer: Self-pay

## 2020-09-27 DIAGNOSIS — G8929 Other chronic pain: Secondary | ICD-10-CM | POA: Diagnosis not present

## 2020-09-27 DIAGNOSIS — M6281 Muscle weakness (generalized): Secondary | ICD-10-CM

## 2020-09-27 DIAGNOSIS — R262 Difficulty in walking, not elsewhere classified: Secondary | ICD-10-CM | POA: Diagnosis not present

## 2020-09-27 DIAGNOSIS — M25562 Pain in left knee: Secondary | ICD-10-CM

## 2020-09-27 NOTE — Therapy (Signed)
[facility] [address] [place], [place], [address] Phone: [phone]   Fax:  [phone]  Physical Therapy Treatment  Patient Details  Name: [name] MRN: [id number] Date of Birth: 1975/11/06 Referring Provider (PT): Darreld Mclean   Encounter Date: 09/27/2020   PT End of Session - 09/27/20 0854    Visit Number 3    Number of Visits 8    Date for PT Re-Evaluation 10/11/20    Authorization Type [employer] Comm PPO  - no auth required, VL 30    Progress Note Due on Visit 10    PT Start Time 0833    PT Stop Time 0912    PT Time Calculation (min) 39 min    Activity Tolerance Patient tolerated treatment well    Behavior During Therapy [facility] for tasks assessed/performed           Past Medical History:  Diagnosis Date   Allergy    Anxiety    Arthritis    Asthma    Breast nodule 01/02/2015   Depression    Fatigue    GERD (gastroesophageal reflux disease)    Herpes    HPV in female    Obesity    PMDD (premenstrual dysphoric disorder)    Thyroid disease    hypothryoidism   Trauma    Vaginal Pap smear, abnormal    Vitamin D deficiency     Past Surgical History:  Procedure Laterality Date   CESAREAN SECTION     CHOLECYSTECTOMY     TONSILECTOMY/ADENOIDECTOMY WITH MYRINGOTOMY     WISDOM TOOTH EXTRACTION      There were no vitals filed for this visit.   Subjective Assessment - 09/27/20 0833    Subjective PT states that her knee is not as bad today but she hasn't been up on it much.    Pertinent History obese, depression    Limitations Standing    Currently in Pain? Yes    Pain Score 4     Pain Location Knee    Pain Orientation Left    Pain Type Chronic pain    Pain Onset More than a month ago    Pain Frequency Constant    Aggravating Factors  wt bearing    Pain Relieving Factors heat                    [facility] Adult PT Treatment/Exercise - 09/27/20 0001      Exercises   Exercises  Knee/Hip      Knee/Hip Exercises: Standing   Heel Raises Both;10 reps    Terminal Knee Extension Left;10 reps    Functional Squat 10 reps      Knee/Hip Exercises: Seated   Long Arc Quad 10 reps;Left      Knee/Hip Exercises: Supine   Short Arc Quad Sets Both;10 reps    Short Arc Quad Sets Limitations 3 #    Bridges with [employer] 10 reps    Straight Leg Raises Left;15 reps    Other Supine Knee/Hip Exercises feet on ball roll into flexion hold 3 seconds x 5      Knee/Hip Exercises: Sidelying   Hip ABduction Left;10 reps                    PT Short Term Goals - 09/27/20 0906      PT SHORT TERM GOAL #1   Title Patient will report at least 25% improvement in overall symptoms and/or  function to demonstrate improved functional mobility    Time 2    Period Weeks    Status On-going    Target Date 09/27/20      PT SHORT TERM GOAL #2   Title Patient will be independent in self management strategies to improve quality of life and functional outcomes.    Time 2    Period Weeks    Status On-going    Target Date 09/27/20             PT Long Term Goals - 09/27/20 0906      PT LONG TERM GOAL #1   Title Patient will report at least 50% improvement in overall symptoms and/or function to demonstrate improved functional mobility    Time 4    Period Weeks    Status On-going      PT LONG TERM GOAL #2   Title Patient will improve on FOTO score to meet predicted outcomes to demonstrate improved functional mobility.    Time 4    Period Weeks    Status On-going      PT LONG TERM GOAL #3   Title Patient will be able to demonstrate painfree left knee ROM in supine.    Time 4    Period Weeks    Status On-going                 Plan - 09/27/20 0855    Clinical Impression Statement Pt advanced in exercises with good control and technique noted after verbal cuing.  PT continues to have significant weakness in core, hip and knee mm causing increased pain.  Pain is  causing pt to be on pain medicationl.  PT will benefit from continued PT to decrease her pain to a point where she does not need to be on oxycotin.    Personal Factors and Comorbidities Comorbidity 1;Comorbidity 2;Fitness    Comorbidities obese, history of bilateral knee pain    Examination-Activity Limitations Squat;Stairs;Stand;Transfers;Locomotion Level    Examination-Participation Restrictions Occupation;Laundry;Community Activity;Cleaning    Stability/Clinical Decision Making Stable/Uncomplicated    Rehab Potential Fair    PT Frequency 2x / week    PT Duration 4 weeks    PT Treatment/Interventions ADLs/Self Care Home Management;Aquatic Therapy;Electrical Stimulation;Cryotherapy;Moist Heat;Balance training;Therapeutic exercise;Therapeutic activities;Iontophoresis 4mg /ml Dexamethasone;Functional mobility training;Gait training;Stair training;DME Instruction;Neuromuscular re-education;Patient/family education;Manual techniques;Dry needling;Passive range of motion;Joint Manipulations    PT Next Visit Plan Focus on quad strength and MONITOR for hyper-extension - goal to reduce hyperextension in functional activities.    PT Home Exercise Plan SLR WITHOUT hyper extension 12/9 LAQ; 12/16: hamstring set; bridge with hip adduction, SAQ    Consulted and Agree with Plan of Care Patient           Patient will benefit from skilled therapeutic intervention in order to improve the following deficits and impairments:  Abnormal gait,Decreased endurance,Pain,Decreased strength,Decreased knowledge of use of DME,Decreased activity tolerance,Decreased balance,Decreased mobility,Difficulty walking,Improper body mechanics,Decreased range of motion  Visit Diagnosis: Chronic pain of left knee  Difficulty in walking, not elsewhere classified  Muscle weakness (generalized)     Problem List Patient Active Problem List   Diagnosis Date Noted   Vitamin D deficiency 07/17/2020   Mixed hyperlipidemia  07/17/2020   Epidermal cyst of vulva 05/29/2020   Itching in the vaginal area 05/29/2020   Gums, bleeding 02/21/2020   Screening for colorectal cancer 11/29/2019   Encounter for gynecological examination with Papanicolaou smear of cervix 11/29/2019   Morbid obesity (HCC) 11/29/2019   PMS (  premenstrual syndrome) 11/14/2019   Dysmenorrhea 11/14/2019   Menorrhagia with regular cycle 11/14/2019   Breast nodule 01/02/2015   Morbid obesity with BMI of 60.0-69.9, adult (HCC) 09/21/2014   Obesity, BMI unknown 05/04/2014   ASCUS with positive high risk HPV 02/22/2014   Contraception management 01/06/2013   Recurrent UTI 01/06/2013   Hypothyroidism 11/01/2009   ANXIETY DEPRESSION 11/01/2009   ALLERGIC ASTHMA 11/01/2009   HEMATOCHEZIA 11/01/2009    LATEX ALLERGY  11/01/2009   PERSONAL HX OTH ALLERG OTH THAN MEDICINAL AGTS 11/01/2009   Virgina Organ, PT CLT [phone] 09/27/2020, 9:07 AM  [facility] [address] [place], [place], [address] Phone: [phone]   Fax:  [phone]  Name: [name] MRN: [id number] Date of Birth: October 17, 1975

## 2020-09-28 ENCOUNTER — Ambulatory Visit (INDEPENDENT_AMBULATORY_CARE_PROVIDER_SITE_OTHER): Payer: BC Managed Care – PPO | Admitting: Psychiatry

## 2020-09-28 DIAGNOSIS — F411 Generalized anxiety disorder: Secondary | ICD-10-CM | POA: Diagnosis not present

## 2020-09-28 DIAGNOSIS — F331 Major depressive disorder, recurrent, moderate: Secondary | ICD-10-CM | POA: Diagnosis not present
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
## 2020-10-02 ENCOUNTER — Other Ambulatory Visit: Payer: Self-pay

## 2020-10-02 ENCOUNTER — Ambulatory Visit: Payer: BC Managed Care – PPO | Admitting: Orthopedic Surgery

## 2020-10-02 ENCOUNTER — Ambulatory Visit (HOSPITAL_COMMUNITY): Payer: BC Managed Care – PPO | Admitting: Physical Therapy

## 2020-10-02 ENCOUNTER — Encounter (HOSPITAL_COMMUNITY): Payer: Self-pay | Admitting: Physical Therapy

## 2020-10-02 DIAGNOSIS — M25562 Pain in left knee: Secondary | ICD-10-CM | POA: Diagnosis not present

## 2020-10-02 DIAGNOSIS — G8929 Other chronic pain: Secondary | ICD-10-CM

## 2020-10-02 DIAGNOSIS — M6281 Muscle weakness (generalized): Secondary | ICD-10-CM

## 2020-10-02 DIAGNOSIS — R262 Difficulty in walking, not elsewhere classified: Secondary | ICD-10-CM

## 2020-10-02 NOTE — Therapy (Signed)
[facility] [address] [place], [place], [address] Phone: [phone]   Fax:  [phone]  Physical Therapy Treatment  Patient Details  Name: [name] MRN: [id number] Date of Birth: 1975/12/06 Referring Provider (PT): Darreld Mclean   Encounter Date: 10/02/2020   PT End of Session - 10/02/20 0909    Visit Number 4    Number of Visits 8    Date for PT Re-Evaluation 10/11/20    Authorization Type BCBS Comm PPO  - no auth required, VL 30    Progress Note Due on Visit 10    PT Start Time 0912    PT Stop Time 0952    PT Time Calculation (min) 40 min    Activity Tolerance Patient tolerated treatment well    Behavior During Therapy [facility] for tasks assessed/performed           Past Medical History:  Diagnosis Date  . Allergy   . Anxiety   . Arthritis   . Asthma   . Breast nodule 01/02/2015  . Depression   . Fatigue   . GERD (gastroesophageal reflux disease)   . Herpes   . HPV in female   . Obesity   . PMDD (premenstrual dysphoric disorder)   . Thyroid disease    hypothryoidism  . Trauma   . Vaginal Pap smear, abnormal   . Vitamin D deficiency     Past Surgical History:  Procedure Laterality Date  . CESAREAN SECTION    . CHOLECYSTECTOMY    . TONSILECTOMY/ADENOIDECTOMY WITH MYRINGOTOMY    . WISDOM TOOTH EXTRACTION      There were no vitals filed for this visit.   Subjective Assessment - 10/02/20 0910    Subjective Patient states some days she feels like her knee is getting better and some days she moves wrong and it still bothers her. She has been trying to not lock her knee out as much.    Pertinent History obese, depression    Limitations Standing    Currently in Pain? No/denies    Pain Onset More than a month ago                             [facility] Adult PT Treatment/Exercise - 10/02/20 0001      Knee/Hip Exercises: Standing   Heel Raises Both;10 reps    Heel Raises Limitations TR x  10    Knee Flexion Both;1 set;10 reps    Hip Flexion Both;1 set;10 reps    Functional Squat 10 reps      Knee/Hip Exercises: Supine   Short Arc Quad Sets Both;10 reps    Short Arc Quad Sets Limitations 3#, 5 second holds    Bridges with [employer] 2 sets;5 sets    Straight Leg Raises Left;15 reps   with cueing to avoid hyperextension   Other Supine Knee/Hip Exercises feet on ball roll into flexion hold 3 seconds x 10                  PT Education - 10/02/20 0909    Education Details Patient educated on HEP, exercise mechanics    Person(s) Educated Patient    Methods Explanation;Demonstration    Comprehension Verbalized understanding;Returned demonstration            PT Short Term Goals - 09/27/20 0906      PT SHORT TERM GOAL #1   Title Patient will  report at least 25% improvement in overall symptoms and/or function to demonstrate improved functional mobility    Time 2    Period Weeks    Status On-going    Target Date 09/27/20      PT SHORT TERM GOAL #2   Title Patient will be independent in self management strategies to improve quality of life and functional outcomes.    Time 2    Period Weeks    Status On-going    Target Date 09/27/20             PT Long Term Goals - 09/27/20 0906      PT LONG TERM GOAL #1   Title Patient will report at least 50% improvement in overall symptoms and/or function to demonstrate improved functional mobility    Time 4    Period Weeks    Status On-going      PT LONG TERM GOAL #2   Title Patient will improve on FOTO score to meet predicted outcomes to demonstrate improved functional mobility.    Time 4    Period Weeks    Status On-going      PT LONG TERM GOAL #3   Title Patient will be able to demonstrate painfree left knee ROM in supine.    Time 4    Period Weeks    Status On-going                 Plan - 10/02/20 0910    Clinical Impression Statement Patient educated on knee compression sleeve for  increased cutaneous input to hopefully reduce knee pain. Completed supine exercises initially as patient has not been able to complete as often as home. Patient fatigues quickly with bridge exercise and is limited to 5 reps. Added standing exercises for patient as she states she is more likely to complete at home. She is given cueing for avoiding knee hyperextension which requires most focus and not much difficulty completing the knee flexion portion. Patient has c/o knee pain with mini squats and she is given cueing for limiting depth to very shallow squat with good carry over. Patient educated on intermixing exercises during day. Patient will continue to benefit from skilled physical therapy in order to reduce impairment and improve function.    Personal Factors and Comorbidities Comorbidity 1;Comorbidity 2;Fitness    Comorbidities obese, history of bilateral knee pain    Examination-Activity Limitations Squat;Stairs;Stand;Transfers;Locomotion Level    Examination-Participation Restrictions Occupation;Laundry;Community Activity;Cleaning    Stability/Clinical Decision Making Stable/Uncomplicated    Rehab Potential Fair    PT Frequency 2x / week    PT Duration 4 weeks    PT Treatment/Interventions ADLs/Self Care Home Management;Aquatic Therapy;Electrical Stimulation;Cryotherapy;Moist Heat;Balance training;Therapeutic exercise;Therapeutic activities;Iontophoresis 4mg /ml Dexamethasone;Functional mobility training;Gait training;Stair training;DME Instruction;Neuromuscular re-education;Patient/family education;Manual techniques;Dry needling;Passive range of motion;Joint Manipulations    PT Next Visit Plan Focus on quad strength and MONITOR for hyper-extension - goal to reduce hyperextension in functional activities.    PT Home Exercise Plan SLR WITHOUT hyper extension 12/9 LAQ; 12/16: hamstring set; bridge with hip adduction, SAQ 12/21 mini squat, heel raise, TR, marching, hamstring curls    Consulted and  Agree with Plan of Care Patient           Patient will benefit from skilled therapeutic intervention in order to improve the following deficits and impairments:  Abnormal gait,Decreased endurance,Pain,Decreased strength,Decreased knowledge of use of DME,Decreased activity tolerance,Decreased balance,Decreased mobility,Difficulty walking,Improper body mechanics,Decreased range of motion  Visit Diagnosis: Chronic pain of left knee  Difficulty in walking, not elsewhere classified  Muscle weakness (generalized)     Problem List Patient Active Problem List   Diagnosis Date Noted  . Vitamin D deficiency 07/17/2020  . Mixed hyperlipidemia 07/17/2020  . Epidermal cyst of vulva 05/29/2020  . Itching in the vaginal area 05/29/2020  . Gums, bleeding 02/21/2020  . Screening for colorectal cancer 11/29/2019  . Encounter for gynecological examination with Papanicolaou smear of cervix 11/29/2019  . Morbid obesity (HCC) 11/29/2019  . PMS (premenstrual syndrome) 11/14/2019  . Dysmenorrhea 11/14/2019  . Menorrhagia with regular cycle 11/14/2019  . Breast nodule 01/02/2015  . Morbid obesity with BMI of 60.0-69.9, adult (HCC) 09/21/2014  . Obesity, BMI unknown 05/04/2014  . ASCUS with positive high risk HPV 02/22/2014  . Contraception management 01/06/2013  . Recurrent UTI 01/06/2013  . Hypothyroidism 11/01/2009  . ANXIETY DEPRESSION 11/01/2009  . ALLERGIC ASTHMA 11/01/2009  . HEMATOCHEZIA 11/01/2009  .  LATEX ALLERGY  11/01/2009  . PERSONAL HX OTH ALLERG OTH THAN MEDICINAL AGTS 11/01/2009   9:59 AM, 10/02/20 Wyman Songster PT, DPT Physical Therapist at [facility]    [facility] [address] [place], [place], [address] Phone: [phone]   Fax:  [phone]  Name: TAVA PEERY MRN: [id number] Date of Birth: 01/23/1976

## 2020-10-02 NOTE — Patient Instructions (Signed)
Access Code: [email or web address] URL: [email or web address] Date: 10/02/2020 Prepared by: Greig Castilla Lennyn Bellanca  Exercises Heel rises with counter support - 1 x daily - 7 x weekly - 2 sets - 10 reps Heel Toe Raises with Counter Support - 1 x daily - 7 x weekly - 2 sets - 10 reps Standing Knee Flexion - 1 x daily - 7 x weekly - 2 sets - 10 reps Mini Squat with Counter Support - 1 x daily - 7 x weekly - 1 sets - 10 reps

## 2020-10-04 ENCOUNTER — Other Ambulatory Visit: Payer: Self-pay

## 2020-10-04 ENCOUNTER — Encounter (HOSPITAL_COMMUNITY): Payer: Self-pay | Admitting: Physical Therapy

## 2020-10-04 ENCOUNTER — Ambulatory Visit (HOSPITAL_COMMUNITY): Payer: BC Managed Care – PPO | Admitting: Physical Therapy

## 2020-10-04 DIAGNOSIS — M25562 Pain in left knee: Secondary | ICD-10-CM

## 2020-10-04 DIAGNOSIS — R262 Difficulty in walking, not elsewhere classified: Secondary | ICD-10-CM | POA: Diagnosis not present

## 2020-10-04 DIAGNOSIS — G8929 Other chronic pain: Secondary | ICD-10-CM | POA: Diagnosis not present

## 2020-10-04 DIAGNOSIS — M6281 Muscle weakness (generalized): Secondary | ICD-10-CM

## 2020-10-04 NOTE — Therapy (Signed)
[facility] [address] [place], [place], [address] Phone: [phone]   Fax:  [phone]  Physical Therapy Treatment  Patient Details  Name: [name] MRN: [id number] Date of Birth: 21-Aug-1976 Referring Provider (PT): Darreld Mclean   Encounter Date: 10/04/2020   PT End of Session - 10/04/20 1315    Visit Number 5    Number of Visits 8    Date for PT Re-Evaluation 10/11/20    Authorization Type BCBS Comm PPO  - no auth required, VL 30    Progress Note Due on Visit 10    PT Start Time 1308    PT Stop Time 1346    PT Time Calculation (min) 38 min    Activity Tolerance Patient limited by fatigue;Patient limited by pain    Behavior During Therapy [facility] for tasks assessed/performed           Past Medical History:  Diagnosis Date  . Allergy   . Anxiety   . Arthritis   . Asthma   . Breast nodule 01/02/2015  . Depression   . Fatigue   . GERD (gastroesophageal reflux disease)   . Herpes   . HPV in female   . Obesity   . PMDD (premenstrual dysphoric disorder)   . Thyroid disease    hypothryoidism  . Trauma   . Vaginal Pap smear, abnormal   . Vitamin D deficiency     Past Surgical History:  Procedure Laterality Date  . CESAREAN SECTION    . CHOLECYSTECTOMY    . TONSILECTOMY/ADENOIDECTOMY WITH MYRINGOTOMY    . WISDOM TOOTH EXTRACTION      There were no vitals filed for this visit.   Subjective Assessment - 10/04/20 1312    Subjective Patient says her knees were really sore after last visit. Does not attribute it to anything specific. Thinks maybe the combination of PT and work that same day.    Pertinent History obese, depression    Limitations Standing    Currently in Pain? Yes    Pain Score 3     Pain Location Knee    Pain Orientation Left    Pain Descriptors / Indicators Aching;Dull    Pain Type Chronic pain    Pain Onset More than a month ago    Pain Frequency Constant    Effect of Pain on Daily  Activities Limits                             [facility] Adult PT Treatment/Exercise - 10/04/20 0001      Knee/Hip Exercises: Supine   Short Arc Quad Sets Both;2 sets;10 reps    Short Arc Quad Sets Limitations 5# 5 sec hold    Bridges 1 set;10 reps    Bridges Limitations tolerated better    Bridges with [employer] 1 set;10 reps   foam roll (causes pain)   Straight Leg Raises Both;2 sets;10 reps                    PT Short Term Goals - 09/27/20 0906      PT SHORT TERM GOAL #1   Title Patient will report at least 25% improvement in overall symptoms and/or function to demonstrate improved functional mobility    Time 2    Period Weeks    Status On-going    Target Date 09/27/20      PT SHORT TERM GOAL #2  Title Patient will be independent in self management strategies to improve quality of life and functional outcomes.    Time 2    Period Weeks    Status On-going    Target Date 09/27/20             PT Long Term Goals - 09/27/20 0906      PT LONG TERM GOAL #1   Title Patient will report at least 50% improvement in overall symptoms and/or function to demonstrate improved functional mobility    Time 4    Period Weeks    Status On-going      PT LONG TERM GOAL #2   Title Patient will improve on FOTO score to meet predicted outcomes to demonstrate improved functional mobility.    Time 4    Period Weeks    Status On-going      PT LONG TERM GOAL #3   Title Patient will be able to demonstrate painfree left knee ROM in supine.    Time 4    Period Weeks    Status On-going                 Plan - 10/04/20 1346    Clinical Impression Statement Patient pain limited with LT knee today. Patient noted pain with quad setting. Noted decreased pain with SAQ. Able to progress to 5lb weights with SAQ. Patient notes knee pain with flexing knee for hip bridge position. Patient was able to attain position, but apprehensive to perform bridge as she notes  this hurt last time. Patient noted medial knee pain with bridge plus adduction squeeze. Removed foam roll (adduction component), patient noted decreased pain. Patient demos fatigue with activity during todays session, requiring rest breaks with supine activity. She also notes that wearing masks give her panic attacks. Discussed modifying activity to patient tolerance with exercise and HEP. Patient says she is unable to do supine exercise often due to her work schedule and that squats hurt her knees. Discussed increasing number of reps with supine HEP exercise when she is able and performing standing HEP exercise to tolerance. Patient will continue to benefit from skilled therapy services to progress knee strength for reduced pain and improved LOF with ADLs.    Personal Factors and Comorbidities Comorbidity 1;Comorbidity 2;Fitness    Comorbidities obese, history of bilateral knee pain    Examination-Activity Limitations Squat;Stairs;Stand;Transfers;Locomotion Level    Examination-Participation Restrictions Occupation;Laundry;Community Activity;Cleaning    Stability/Clinical Decision Making Stable/Uncomplicated    Rehab Potential Fair    PT Frequency 2x / week    PT Duration 4 weeks    PT Treatment/Interventions ADLs/Self Care Home Management;Aquatic Therapy;Electrical Stimulation;Cryotherapy;Moist Heat;Balance training;Therapeutic exercise;Therapeutic activities;Iontophoresis 4mg /ml Dexamethasone;Functional mobility training;Gait training;Stair training;DME Instruction;Neuromuscular re-education;Patient/family education;Manual techniques;Dry needling;Passive range of motion;Joint Manipulations    PT Next Visit Plan Focus on quad strength and MONITOR for hyper-extension - goal to reduce hyperextension in functional activities.    PT Home Exercise Plan SLR WITHOUT hyper extension 12/9 LAQ; 12/16: hamstring set; bridge with hip adduction, SAQ 12/21 mini squat, heel raise, TR, marching, hamstring curls     Consulted and Agree with Plan of Care Patient           Patient will benefit from skilled therapeutic intervention in order to improve the following deficits and impairments:  Abnormal gait,Decreased endurance,Pain,Decreased strength,Decreased knowledge of use of DME,Decreased activity tolerance,Decreased balance,Decreased mobility,Difficulty walking,Improper body mechanics,Decreased range of motion  Visit Diagnosis: Chronic pain of left knee  Difficulty in walking, not elsewhere classified  Muscle weakness (generalized)     Problem List Patient Active Problem List   Diagnosis Date Noted  . Vitamin D deficiency 07/17/2020  . Mixed hyperlipidemia 07/17/2020  . Epidermal cyst of vulva 05/29/2020  . Itching in the vaginal area 05/29/2020  . Gums, bleeding 02/21/2020  . Screening for colorectal cancer 11/29/2019  . Encounter for gynecological examination with Papanicolaou smear of cervix 11/29/2019  . Morbid obesity (HCC) 11/29/2019  . PMS (premenstrual syndrome) 11/14/2019  . Dysmenorrhea 11/14/2019  . Menorrhagia with regular cycle 11/14/2019  . Breast nodule 01/02/2015  . Morbid obesity with BMI of 60.0-69.9, adult (HCC) 09/21/2014  . Obesity, BMI unknown 05/04/2014  . ASCUS with positive high risk HPV 02/22/2014  . Contraception management 01/06/2013  . Recurrent UTI 01/06/2013  . Hypothyroidism 11/01/2009  . ANXIETY DEPRESSION 11/01/2009  . ALLERGIC ASTHMA 11/01/2009  . HEMATOCHEZIA 11/01/2009  .  LATEX ALLERGY  11/01/2009  . PERSONAL HX OTH ALLERG OTH THAN MEDICINAL AGTS 11/01/2009    1:48 PM, 10/04/20 Georges Lynch PT DPT  Physical Therapist with [facility]  [phone]   [facility] [address] [place], [place], [address] Phone: [phone]   Fax:  [phone]  Name: PATRIC VANPELT MRN: [id number] Date of Birth: 1976/09/14

## 2020-10-09 ENCOUNTER — Other Ambulatory Visit: Payer: Self-pay

## 2020-10-09 ENCOUNTER — Ambulatory Visit (HOSPITAL_COMMUNITY): Payer: BC Managed Care – PPO

## 2020-10-09 ENCOUNTER — Encounter (HOSPITAL_COMMUNITY): Payer: Self-pay

## 2020-10-09 DIAGNOSIS — M6281 Muscle weakness (generalized): Secondary | ICD-10-CM | POA: Diagnosis not present

## 2020-10-09 DIAGNOSIS — M25562 Pain in left knee: Secondary | ICD-10-CM | POA: Diagnosis not present

## 2020-10-09 DIAGNOSIS — R262 Difficulty in walking, not elsewhere classified: Secondary | ICD-10-CM

## 2020-10-09 DIAGNOSIS — G8929 Other chronic pain: Secondary | ICD-10-CM

## 2020-10-09 NOTE — Therapy (Signed)
[facility] [address] [place], [place], [address] Phone: [phone]   Fax:  [phone]  Physical Therapy Treatment  Patient Details  Name: [name] MRN: [id number] Date of Birth: [birth year] Referring Provider (PT): Darreld Mclean   Encounter Date: 10/09/2020   PT End of Session - 10/09/20 1539    Visit Number 6    Number of Visits 8    Date for PT Re-Evaluation 10/11/20    Authorization Type BCBS Comm PPO  - no auth required, VL 30    Progress Note Due on Visit 10    PT Start Time 1536    PT Stop Time 1615    PT Time Calculation (min) 39 min    Activity Tolerance Patient limited by fatigue;Patient limited by pain;Patient tolerated treatment well    Behavior During Therapy [facility] for tasks assessed/performed           Past Medical History:  Diagnosis Date  . Allergy   . Anxiety   . Arthritis   . Asthma   . Breast nodule 01/02/2015  . Depression   . Fatigue   . GERD (gastroesophageal reflux disease)   . Herpes   . HPV in female   . Obesity   . PMDD (premenstrual dysphoric disorder)   . Thyroid disease    hypothryoidism  . Trauma   . Vaginal Pap smear, abnormal   . Vitamin D deficiency     Past Surgical History:  Procedure Laterality Date  . CESAREAN SECTION    . CHOLECYSTECTOMY    . TONSILECTOMY/ADENOIDECTOMY WITH MYRINGOTOMY    . WISDOM TOOTH EXTRACTION      There were no vitals filed for this visit.   Subjective Assessment - 10/09/20 1537    Subjective Pt reports she had a panic attack partially from wearing mask and requested not to wear mask during session.  Reports knee is feeling okay, pain scale 3/10 achey that comes and goes.    Currently in Pain? Yes    Pain Score 3     Pain Location Knee    Pain Orientation Left    Pain Descriptors / Indicators Dull;Aching    Pain Type Chronic pain    Pain Onset More than a month ago    Pain Frequency Constant    Aggravating Factors  weight bearing     Pain Relieving Factors heat    Effect of Pain on Daily Activities Limits                             [facility] Adult PT Treatment/Exercise - 10/09/20 0001      Exercises   Exercises Knee/Hip      Knee/Hip Exercises: Standing   Heel Raises 15 reps    Knee Flexion Both;1 set;10 reps    Terminal Knee Extension Left;10 reps    Theraband Level (Terminal Knee Extension) Level 2 (Red)    Terminal Knee Extension Limitations no hyperextension    Hip Abduction Both;2 sets;5 reps    Hip Extension Both;2 sets;5 reps    Functional Squat 5 reps    Functional Squat Limitations minisquat wiht cueing for form/mechaincs    Other Standing Knee Exercises Tandem stance wiht knees bent 2x 30"      Knee/Hip Exercises: Seated   Long Arc Quad 10 reps;Left    Long Arc Quad Limitations 5 second holds 2 sets, cueing to reduce hyperextension    Sit  to [place] 1 set;Other (comment);without UE support   increased pain following 1st rep     Knee/Hip Exercises: Supine   Short Arc Quad Sets Both;2 sets;10 reps    Short Arc Quad Sets Limitations 5#, 5" holds, 3" down    Straight Leg Raises Both;2 sets;10 reps                    PT Short Term Goals - 09/27/20 0906      PT SHORT TERM GOAL #1   Title Patient will report at least 25% improvement in overall symptoms and/or function to demonstrate improved functional mobility    Time 2    Period Weeks    Status On-going    Target Date 09/27/20      PT SHORT TERM GOAL #2   Title Patient will be independent in self management strategies to improve quality of life and functional outcomes.    Time 2    Period Weeks    Status On-going    Target Date 09/27/20             PT Long Term Goals - 09/27/20 0906      PT LONG TERM GOAL #1   Title Patient will report at least 50% improvement in overall symptoms and/or function to demonstrate improved functional mobility    Time 4    Period Weeks    Status On-going      PT LONG TERM GOAL  #2   Title Patient will improve on FOTO score to meet predicted outcomes to demonstrate improved functional mobility.    Time 4    Period Weeks    Status On-going      PT LONG TERM GOAL #3   Title Patient will be able to demonstrate painfree left knee ROM in supine.    Time 4    Period Weeks    Status On-going                 Plan - 10/09/20 1558    Clinical Impression Statement Pt arrived requesting not to wear mask during session due to panic attack last session.  Sessoin complete in private room wiht husband for support.  Session focus on quad and gluteal strengthening.  Held bridges this session due to reports of increased panic attack in position.  Progressed to standing hip strengthening exercises.  Cueing through session to reduce hyperextension and stabilize core prior movement.  Pt able to demonstrate good stability with tandem stance, no HHA required did notice visual hip fatigue with static balance activity.  No reoprts of increased pain.  Did c/o pain during sit to stand though no pain following task.    Personal Factors and Comorbidities Comorbidity 1;Comorbidity 2;Fitness    Comorbidities obese, history of bilateral knee pain    Examination-Activity Limitations Squat;Stairs;Stand;Transfers;Locomotion Level    Examination-Participation Restrictions Occupation;Laundry;Community Activity;Cleaning    Stability/Clinical Decision Making Stable/Uncomplicated    Clinical Decision Making Low    Rehab Potential Fair    PT Frequency 2x / week    PT Duration 4 weeks    PT Treatment/Interventions ADLs/Self Care Home Management;Aquatic Therapy;Electrical Stimulation;Cryotherapy;Moist Heat;Balance training;Therapeutic exercise;Therapeutic activities;Iontophoresis 4mg /ml Dexamethasone;Functional mobility training;Gait training;Stair training;DME Instruction;Neuromuscular re-education;Patient/family education;Manual techniques;Dry needling;Passive range of motion;Joint Manipulations     PT Next Visit Plan Progress note next session, end of cert.  Focus on quad strength and MONITOR for hyper-extension - goal to reduce hyperextension in functional activities.    PT Home Exercise Plan SLR WITHOUT  hyper extension 12/9 LAQ; 12/16: hamstring set; bridge with hip adduction, SAQ 12/21 mini squat, heel raise, TR, marching, hamstring curls           Patient will benefit from skilled therapeutic intervention in order to improve the following deficits and impairments:  Abnormal gait,Decreased endurance,Pain,Decreased strength,Decreased knowledge of use of DME,Decreased activity tolerance,Decreased balance,Decreased mobility,Difficulty walking,Improper body mechanics,Decreased range of motion  Visit Diagnosis: Chronic pain of left knee  Difficulty in walking, not elsewhere classified  Muscle weakness (generalized)     Problem List Patient Active Problem List   Diagnosis Date Noted  . Vitamin D deficiency 07/17/2020  . Mixed hyperlipidemia 07/17/2020  . Epidermal cyst of vulva 05/29/2020  . Itching in the vaginal area 05/29/2020  . Gums, bleeding 02/21/2020  . Screening for colorectal cancer 11/29/2019  . Encounter for gynecological examination with Papanicolaou smear of cervix 11/29/2019  . Morbid obesity (HCC) 11/29/2019  . PMS (premenstrual syndrome) 11/14/2019  . Dysmenorrhea 11/14/2019  . Menorrhagia with regular cycle 11/14/2019  . Breast nodule 01/02/2015  . Morbid obesity with BMI of 60.0-69.9, adult (HCC) 09/21/2014  . Obesity, BMI unknown 05/04/2014  . ASCUS with positive high risk HPV 02/22/2014  . Contraception management 01/06/2013  . Recurrent UTI 01/06/2013  . Hypothyroidism 11/01/2009  . ANXIETY DEPRESSION 11/01/2009  . ALLERGIC ASTHMA 11/01/2009  . HEMATOCHEZIA 11/01/2009  .  LATEX ALLERGY  11/01/2009  . PERSONAL HX OTH ALLERG OTH THAN MEDICINAL AGTS 11/01/2009   Becky Sax, LPTA/CLT; CBIS [phone]  Juel Burrow 10/09/2020, 4:52  PM  [facility] [address] [place], [place], [address] Phone: [phone]   Fax:  [phone]  Name: [name] MRN: [id number] Date of Birth: 1975/10/25

## 2020-10-10 ENCOUNTER — Ambulatory Visit
Admission: RE | Admit: 2020-10-10 | Discharge: 2020-10-10 | Disposition: A | Discharge status: Home / Self Care | Payer: BC Managed Care – PPO | Source: Ambulatory Visit | Attending: Orthopaedic Surgery | Admitting: Orthopaedic Surgery

## 2020-10-10 DIAGNOSIS — M25562 Pain in left knee: Secondary | ICD-10-CM

## 2020-10-10 DIAGNOSIS — G8929 Other chronic pain: Secondary | ICD-10-CM

## 2020-10-11 ENCOUNTER — Ambulatory Visit (HOSPITAL_COMMUNITY): Payer: BC Managed Care – PPO | Admitting: Physical Therapy

## 2020-10-11 ENCOUNTER — Encounter (HOSPITAL_COMMUNITY): Payer: Self-pay | Admitting: Physical Therapy

## 2020-10-11 ENCOUNTER — Other Ambulatory Visit: Payer: Self-pay

## 2020-10-11 DIAGNOSIS — G8929 Other chronic pain: Secondary | ICD-10-CM | POA: Diagnosis not present

## 2020-10-11 DIAGNOSIS — M6281 Muscle weakness (generalized): Secondary | ICD-10-CM

## 2020-10-11 DIAGNOSIS — M25562 Pain in left knee: Secondary | ICD-10-CM

## 2020-10-11 DIAGNOSIS — R262 Difficulty in walking, not elsewhere classified: Secondary | ICD-10-CM

## 2020-10-11 NOTE — Therapy (Signed)
[facility] [address] [place], [place], [address] Phone: [phone]   Fax:  [phone]  Physical Therapy Treatment  Patient Details  Name: [name] MRN: [id number] Date of Birth: [birth year] Referring Provider (PT): Sanjuana Kava  Progress Note Reporting Period 09/13/2020 to 10/11/2020  See note below for Objective Data and Assessment of Progress/Goals.   Pt has not met any goals.  Aquatic therapy has just opened up and pt may benefit from aquatic therapy, therefore therapist  Is recommending to continue therapy to see if aquatics can help to decrease her pain.    Encounter Date: 10/11/2020   PT End of Session - 10/11/20 1220    Visit Number 7    Number of Visits 15    Date for PT Re-Evaluation 11/16/2020  8 visits over a 5 week period    Authorization Type [employer] Comm PPO  - no auth required, VL 30    Progress Note Due on Visit 15    PT Start Time 0830    PT Stop Time 0920    PT Time Calculation (min) 50 min    Activity Tolerance Patient limited by fatigue;Patient limited by pain;Patient tolerated treatment well    Behavior During Therapy [facility] for tasks assessed/performed           Past Medical History:  Diagnosis Date   Allergy    Anxiety    Arthritis    Asthma    Breast nodule 01/02/2015   Depression    Fatigue    GERD (gastroesophageal reflux disease)    Herpes    HPV in female    Obesity    PMDD (premenstrual dysphoric disorder)    Thyroid disease    hypothryoidism   Trauma    Vaginal Pap smear, abnormal    Vitamin D deficiency     Past Surgical History:  Procedure Laterality Date   CESAREAN SECTION     CHOLECYSTECTOMY     TONSILECTOMY/ADENOIDECTOMY WITH MYRINGOTOMY     WISDOM TOOTH EXTRACTION      There were no vitals filed for this visit.   Subjective Assessment - 10/11/20 0836    Subjective Pt states that she is able to complete the standing and sitting exercises more  than the lying down ones,  She is not able to do the bridge or sit to stand.    Pertinent History obese, depression    Limitations Standing    Currently in Pain? Yes    Pain Score 2    Worst pain she has had in her knee has been a 9/10; best has been a 2   Pain Location Knee    Pain Orientation Left    Pain Descriptors / Indicators Aching;Dull    Pain Type Chronic pain    Pain Onset More than a month ago    Pain Frequency Constant    Aggravating Factors  wt bearing    Pain Relieving Factors heat    Effect of Pain on Daily Activities limits              [facility] PT Assessment - 10/11/20 0001      Assessment   Medical Diagnosis lef tknee pain    Referring Provider (PT) Sanjuana Kava    Prior Therapy no      [place] residence    Living Arrangements Spouse/significant other;Children    Available Help at Discharge Family    Type of [place]  Home Access Stairs to enter    Entrance Stairs-Number of Steps 5    Entrance Stairs-Rails None    Home Layout Laundry or work area in basement;One level    [place] - quad;Shower seat - built in      Prior Function   Level of Independence Independent    Vocation Full time employment    Financial planner      Cognition   Overall Cognitive Status Within Functional Limits for tasks assessed      Observation/Other Assessments   Focus on Therapeutic Outcomes (FOTO)  36% function was 30%      AROM   Right Knee Extension 0   was hyperextension   Right Knee Flexion 120   was 110   Left Knee Extension 0   was hyperextension   Left Knee Flexion 112   painful with movement; was 102     Strength   Right Hip Flexion 4/5   pain   Right Hip Extension 3/5    Right Hip ABduction 3+/5    Left Hip Flexion 3/5   was 3+ but was going thru pain not to pain   Left Hip Extension 3/5    Left Hip ABduction 3+/5    Right Knee Flexion 4+/5    Right Knee Extension 5/5    Left  Knee Flexion 3+/5   was 4- but pt was pushing thru the pain at that time   Left Knee Extension 3+/5   Pt was pushing thru the pain on initial eval     Palpation   Patella mobility --   pain with palpation at superior pole, patella alta,                        [facility] Adult PT Treatment/Exercise - 10/11/20 0001      Exercises   Exercises Knee/Hip      Knee/Hip Exercises: Seated   Long Arc Quad Left;5 reps    Sit to [employer] 5 reps      Knee/Hip Exercises: Supine   Quad Sets Both    Heel Slides Both      Knee/Hip Exercises: Sidelying   Hip ABduction Both;1 set      Knee/Hip Exercises: Prone   Hamstring Curl 1 set    Hip Extension Both;1 set                    PT Short Term Goals - 10/11/20 0900      PT SHORT TERM GOAL #1   Title Patient will report at least 25% improvement in overall symptoms and/or function to demonstrate improved functional mobility    Time 2    Period Weeks    Status On-going    Target Date 09/27/20      PT SHORT TERM GOAL #2   Title Patient will be independent in self management strategies to improve quality of life and functional outcomes.    Time 2    Period Weeks    Status On-going    Target Date 09/27/20             PT Long Term Goals - 10/11/20 0900      PT LONG TERM GOAL #1   Title Patient will report at least 50% improvement in overall symptoms and/or function to demonstrate improved functional mobility    Time 4    Period Weeks    Status On-going  PT LONG TERM GOAL #2   Title Patient will improve on FOTO score to meet predicted outcomes to demonstrate improved functional mobility.    Time 4    Period Weeks    Status On-going      PT LONG TERM GOAL #3   Title Patient will be able to demonstrate painfree left knee ROM in supine.    Time 4    Period Weeks    Status Achieved                 Plan - 10/11/20 0915    Clinical Impression Statement Pt states that she has not seen much improvement  at this time.  The pool has just opened up,(closed for repairs), and the pt feels that she may be more successful in the pool.  We will extend therapy for 4 more weeks to include aquatics to see if this is benefical for the pt.    Personal Factors and Comorbidities Comorbidity 1;Comorbidity 2;Fitness    Comorbidities obese, history of bilateral knee pain    Examination-Activity Limitations Squat;Stairs;Stand;Transfers;Locomotion Level    Examination-Participation Restrictions Occupation;Laundry;Community Activity;Cleaning    Stability/Clinical Decision Making Stable/Uncomplicated    Rehab Potential Fair    PT Frequency 2x / week    PT Duration 8 weeks   additional 4 weeks   PT Treatment/Interventions ADLs/Self Care Home Management;Aquatic Therapy;Electrical Stimulation;Cryotherapy;Moist Heat;Balance training;Therapeutic exercise;Therapeutic activities;Iontophoresis 17m/ml Dexamethasone;Functional mobility training;Gait training;Stair training;DME Instruction;Neuromuscular re-education;Patient/family education;Manual techniques;Dry needling;Passive range of motion;Joint Manipulations    PT Next Visit Plan Progress note next session, end of cert.  Focus on quad strength and MONITOR for hyper-extension - goal to reduce hyperextension in functional activities.    PT Home Exercise Plan stop bridge as this causes increased knee pain and begins pt to panic can focus on sit to stand instead, focus on mini, mini squat as increase pain with a 1/2 squat, begin aquatics, SLR WITHOUT hyper extension 12/9 LAQ; 12/16: hamstring set; , SAQ 12/21 mini squat, heel raise, TR, marching, hamstring curls           Patient will benefit from skilled therapeutic intervention in order to improve the following deficits and impairments:  Abnormal gait,Decreased endurance,Pain,Decreased strength,Decreased knowledge of use of DME,Decreased activity tolerance,Decreased balance,Decreased mobility,Difficulty walking,Improper body  mechanics,Decreased range of motion  Visit Diagnosis: Chronic pain of left knee  Difficulty in walking, not elsewhere classified  Muscle weakness (generalized)     Problem List Patient Active Problem List   Diagnosis Date Noted   Vitamin D deficiency 07/17/2020   Mixed hyperlipidemia 07/17/2020   Epidermal cyst of vulva 05/29/2020   Itching in the vaginal area 05/29/2020   Gums, bleeding 02/21/2020   Screening for colorectal cancer 11/29/2019   Encounter for gynecological examination with Papanicolaou smear of cervix 11/29/2019   Morbid obesity ([place] 11/29/2019   PMS (premenstrual syndrome) 11/14/2019   Dysmenorrhea 11/14/2019   Menorrhagia with regular cycle 11/14/2019   Breast nodule 01/02/2015   Morbid obesity with BMI of 60.0-69.9, adult ([place] 09/21/2014   Obesity, BMI unknown 05/04/2014   ASCUS with positive high risk HPV 02/22/2014   Contraception management 01/06/2013   Recurrent UTI 01/06/2013   Hypothyroidism 11/01/2009   ANXIETY DEPRESSION 11/01/2009   ALLERGIC ASTHMA 11/01/2009   HEMATOCHEZIA 11/01/2009    LATEX ALLERGY 11/01/2009   PERSONAL HX OTH ALLERG OTH THAN MEDICINAL AGTS 11/01/2009    Suhayb Anzalone,CINDY 10/11/2020, 12:22 PM  [place] [address]  Phone: [phone]   Fax:  [phone]  Name: [name] MRN: [id number] Date of Birth: [birth year]

## 2020-10-16 ENCOUNTER — Other Ambulatory Visit: Payer: Self-pay

## 2020-10-16 ENCOUNTER — Ambulatory Visit (HOSPITAL_COMMUNITY): Payer: BC Managed Care – PPO | Attending: Orthopaedic Surgery | Admitting: Physical Therapy

## 2020-10-16 DIAGNOSIS — M25562 Pain in left knee: Secondary | ICD-10-CM | POA: Insufficient documentation

## 2020-10-16 DIAGNOSIS — M6281 Muscle weakness (generalized): Secondary | ICD-10-CM

## 2020-10-16 DIAGNOSIS — R262 Difficulty in walking, not elsewhere classified: Secondary | ICD-10-CM | POA: Insufficient documentation

## 2020-10-16 DIAGNOSIS — G8929 Other chronic pain: Secondary | ICD-10-CM | POA: Diagnosis not present

## 2020-10-16 NOTE — Therapy (Signed)
[facility] [address] [place], [place], [address] Phone: [phone]   Fax:  [phone]  Physical Therapy Treatment  Patient Details  Name: [name] MRN: [id number] Date of Birth: [birth year] Referring Provider (PT): Darreld Mclean   Encounter Date: 10/16/2020   PT End of Session - 10/16/20 0954    Visit Number 8    Number of Visits 15    Date for PT Re-Evaluation 11/16/20   8 visits over a 5 week period   Authorization Type BCBS Comm PPO  - no auth required, VL 30    Progress Note Due on Visit 15    PT Start Time 0835    PT Stop Time 0920    PT Time Calculation (min) 45 min    Activity Tolerance Patient limited by fatigue;Patient limited by pain;Patient tolerated treatment well    Behavior During Therapy [facility] for tasks assessed/performed           Past Medical History:  Diagnosis Date  . Allergy   . Anxiety   . Arthritis   . Asthma   . Breast nodule 01/02/2015  . Depression   . Fatigue   . GERD (gastroesophageal reflux disease)   . Herpes   . HPV in female   . Obesity   . PMDD (premenstrual dysphoric disorder)   . Thyroid disease    hypothryoidism  . Trauma   . Vaginal Pap smear, abnormal   . Vitamin D deficiency     Past Surgical History:  Procedure Laterality Date  . CESAREAN SECTION    . CHOLECYSTECTOMY    . TONSILECTOMY/ADENOIDECTOMY WITH MYRINGOTOMY    . WISDOM TOOTH EXTRACTION         Subjective Assessment - 10/16/20 0850    Subjective Pt states her pain is 4/10 in lt knee today.    Currently in Pain? Yes    Pain Score 4     Pain Location Knee    Pain Orientation Left    Pain Descriptors / Indicators Aching    Pain Type Chronic pain                             [facility] Adult PT Treatment/Exercise - 10/16/20 0001      Knee/Hip Exercises: Seated   Long Arc Quad Left;2 sets;10 reps    Long [employer] Limitations 5 second holds 2 sets, cueing to reduce hyperextension    Sit  to [employer] 2 sets;10 reps;without UE support      Knee/Hip Exercises: Supine   Short Arc [employer] Both;2 sets;10 reps    Short Arc [employer] Limitations 5# 5" holds    Straight Leg Raises Both;2 sets;10 reps      Knee/Hip Exercises: Sidelying   Hip ABduction Both;2 sets;10 reps      Knee/Hip Exercises: Prone   Hamstring Curl 2 sets;10 reps    Hip Extension Both;2 sets;10 reps                    PT Short Term Goals - 10/11/20 0900      PT SHORT TERM GOAL #1   Title Patient will report at least 25% improvement in overall symptoms and/or function to demonstrate improved functional mobility    Time 2    Period Weeks    Status On-going    Target Date 09/27/20      PT SHORT TERM GOAL #2  Title Patient will be independent in self management strategies to improve quality of life and functional outcomes.    Time 2    Period Weeks    Status On-going    Target Date 09/27/20             PT Long Term Goals - 10/11/20 0900      PT LONG TERM GOAL #1   Title Patient will report at least 50% improvement in overall symptoms and/or function to demonstrate improved functional mobility    Time 4    Period Weeks    Status On-going      PT LONG TERM GOAL #2   Title Patient will improve on FOTO score to meet predicted outcomes to demonstrate improved functional mobility.    Time 4    Period Weeks    Status On-going      PT LONG TERM GOAL #3   Title Patient will be able to demonstrate painfree left knee ROM in supine.    Time 4    Period Weeks    Status Achieved                 Plan - 10/16/20 0955    Clinical Impression Statement continued with LE strengthening exercises for bil LE's.  Completed 2 sets of each exercise.  Pt able to complete all exercises without c/o pain, just shortness of breath/fatigue.  Cues for rest, hold times and keeping knees from hyperextending.    Personal Factors and Comorbidities Comorbidity 1;Comorbidity 2;Fitness    Comorbidities  obese, history of bilateral knee pain    Examination-Activity Limitations Squat;Stairs;Stand;Transfers;Locomotion Level    Examination-Participation Restrictions Occupation;Laundry;Community Activity;Cleaning    Stability/Clinical Decision Making Stable/Uncomplicated    Rehab Potential Fair    PT Frequency 2x / week    PT Duration 8 weeks   additional 4 weeks   PT Treatment/Interventions ADLs/Self Care Home Management;Aquatic Therapy;Electrical Stimulation;Cryotherapy;Moist Heat;Balance training;Therapeutic exercise;Therapeutic activities;Iontophoresis 4mg /ml Dexamethasone;Functional mobility training;Gait training;Stair training;DME Instruction;Neuromuscular re-education;Patient/family education;Manual techniques;Dry needling;Passive range of motion;Joint Manipulations    PT Next Visit Plan Focus on quad strength and MONITOR for hyper-extension - goal to reduce hyperextension in functional activities. Check on getting patient scheduled for aquatics.    PT Home Exercise Plan stop bridge, SLR WITHOUT hyper extension 12/9 LAQ; 12/16: hamstring set; , SAQ 12/21 mini squat, heel raise, TR, marching, hamstring curls           Patient will benefit from skilled therapeutic intervention in order to improve the following deficits and impairments:  Abnormal gait,Decreased endurance,Pain,Decreased strength,Decreased knowledge of use of DME,Decreased activity tolerance,Decreased balance,Decreased mobility,Difficulty walking,Improper body mechanics,Decreased range of motion  Visit Diagnosis: Chronic pain of left knee  Difficulty in walking, not elsewhere classified  Muscle weakness (generalized)     Problem List Patient Active Problem List   Diagnosis Date Noted  . Vitamin D deficiency 07/17/2020  . Mixed hyperlipidemia 07/17/2020  . Epidermal cyst of vulva 05/29/2020  . Itching in the vaginal area 05/29/2020  . Gums, bleeding 02/21/2020  . Screening for colorectal cancer 11/29/2019  .  Encounter for gynecological examination with Papanicolaou smear of cervix 11/29/2019  . Morbid obesity ([place]) 11/29/2019  . PMS (premenstrual syndrome) 11/14/2019  . Dysmenorrhea 11/14/2019  . Menorrhagia with regular cycle 11/14/2019  . Breast nodule 01/02/2015  . Morbid obesity with BMI of 60.0-69.9, adult ([place]) 09/21/2014  . Obesity, BMI unknown 05/04/2014  . ASCUS with positive high risk HPV 02/22/2014  . Contraception management 01/06/2013  . Recurrent  UTI 01/06/2013  . Hypothyroidism 11/01/2009  . ANXIETY DEPRESSION 11/01/2009  . ALLERGIC ASTHMA 11/01/2009  . HEMATOCHEZIA 11/01/2009  . 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 PERSONAL HX OTH ALLERG OTH THAN MEDICINAL AGTS 11/01/2009   Lurena Nida, PTA/CLT [phone]  Lurena Nida 10/16/2020, 9:59 AM  [facility] [address] [place], [place], [address] Phone: [phone]   Fax:  [phone]  Name: [name] MRN: [id number] Date of Birth: 08-Sep-1976

## 2020-10-17 ENCOUNTER — Other Ambulatory Visit: Payer: BC Managed Care – PPO

## 2020-10-18 ENCOUNTER — Ambulatory Visit (HOSPITAL_COMMUNITY): Payer: BC Managed Care – PPO | Admitting: Physical Therapy

## 2020-10-18 ENCOUNTER — Encounter (HOSPITAL_COMMUNITY): Payer: Self-pay | Admitting: Physical Therapy

## 2020-10-18 ENCOUNTER — Other Ambulatory Visit: Payer: Self-pay

## 2020-10-18 DIAGNOSIS — G8929 Other chronic pain: Secondary | ICD-10-CM | POA: Diagnosis not present

## 2020-10-18 DIAGNOSIS — R262 Difficulty in walking, not elsewhere classified: Secondary | ICD-10-CM

## 2020-10-18 DIAGNOSIS — M25562 Pain in left knee: Secondary | ICD-10-CM | POA: Diagnosis not present

## 2020-10-18 DIAGNOSIS — M6281 Muscle weakness (generalized): Secondary | ICD-10-CM | POA: Diagnosis not present

## 2020-10-18 NOTE — Therapy (Signed)
[place], [place], [address] Phone: [phone]   Fax:  [phone]  Physical Therapy Treatment  Patient Details  Name: [name] MRN: [id number] Date of Birth: 12-07-1975 Referring Provider (PT): Sanjuana Kava   Encounter Date: 10/18/2020   PT End of Session - 10/18/20 0852    Visit Number 9    Number of Visits 15    Date for PT Re-Evaluation 11/16/20   8 visits over a 5 week period   Authorization Type [employer] Comm PPO  - no auth required, VL 30    Progress Note Due on Visit 15    PT Start Time 0840   pt late for appointment   PT Stop Time 0920    PT Time Calculation (min) 40 min    Activity Tolerance Patient limited by fatigue;Patient limited by pain;Patient tolerated treatment well    Behavior During Therapy [facility] for tasks assessed/performed           Past Medical History:  Diagnosis Date  . Allergy   . Anxiety   . Arthritis   . Asthma   . Breast nodule 01/02/2015  . Depression   . Fatigue   . GERD (gastroesophageal reflux disease)   . Herpes   . HPV in female   . Obesity   . PMDD (premenstrual dysphoric disorder)   . Thyroid disease    hypothryoidism  . Trauma   . Vaginal Pap smear, abnormal   . Vitamin D deficiency     Past Surgical History:  Procedure Laterality Date  . CESAREAN SECTION    . CHOLECYSTECTOMY    . TONSILECTOMY/ADENOIDECTOMY WITH MYRINGOTOMY    . WISDOM TOOTH EXTRACTION      There were no vitals filed for this visit.   Subjective Assessment - 10/18/20 0841    Subjective PT states that both of her knees are hurting today.  After Tuesday session both knees were hurting real bad.    Currently in Pain? Yes    Pain Score 6     Pain Location Knee    Pain Orientation Left    Pain Descriptors / Indicators Aching    Pain Type Chronic pain    Pain Onset More than a month ago    Pain Frequency Constant    Aggravating Factors  wt bearing    Pain Relieving Factors heat     Effect of Pain on Daily Activities limits                             [facility] Adult PT Treatment/Exercise - 10/18/20 0001      Exercises   Exercises Knee/Hip      Knee/Hip Exercises: Standing   Functional Squat 10 reps      Knee/Hip Exercises: Seated   Long Arc Quad Both;10 reps      Knee/Hip Exercises: Supine   Quad Sets Both;10 reps    Short Arc Quad Sets Both;2 sets;10 reps    Hip Adduction Isometric Both;10 reps    Straight Leg Raises Both;10 reps    Other Supine Knee/Hip Exercises marching in placex 5; feet on ball roll into flexion hold 3 seconds x 10    Other Supine Knee/Hip Exercises ab , ham and glut set x 10      Knee/Hip Exercises: Sidelying   Hip ABduction Both;10 reps      Knee/Hip Exercises: Prone   Hamstring Curl 15  reps      Modalities   Modalities Moist Heat      Moist Heat Therapy   Number Minutes Moist Heat 15 Minutes    Moist Heat Location Knee   both                   PT Short Term Goals - 10/11/20 0900      PT SHORT TERM GOAL #1   Title Patient will report at least 25% improvement in overall symptoms and/or function to demonstrate improved functional mobility    Time 2    Period Weeks    Status On-going    Target Date 09/27/20      PT SHORT TERM GOAL #2   Title Patient will be independent in self management strategies to improve quality of life and functional outcomes.    Time 2    Period Weeks    Status On-going    Target Date 09/27/20             PT Long Term Goals - 10/11/20 0900      PT LONG TERM GOAL #1   Title Patient will report at least 50% improvement in overall symptoms and/or function to demonstrate improved functional mobility    Time 4    Period Weeks    Status On-going      PT LONG TERM GOAL #2   Title Patient will improve on FOTO score to meet predicted outcomes to demonstrate improved functional mobility.    Time 4    Period Weeks    Status On-going      PT LONG TERM GOAL #3    Title Patient will be able to demonstrate painfree left knee ROM in supine.    Time 4    Period Weeks    Status Achieved                 Plan - 10/18/20 0853    Clinical Impression Statement PT to department with B knee pain Lt greater than right states pain increased since Tuesday session.  Started session with HMP to knees while pt completing isometric exercises to improve circulation and decrease pain.    Personal Factors and Comorbidities Comorbidity 1;Comorbidity 2;Fitness    Comorbidities obese, history of bilateral knee pain    Examination-Activity Limitations Squat;Stairs;Stand;Transfers;Locomotion Level    Examination-Participation Restrictions Occupation;Laundry;Community Activity;Cleaning    Stability/Clinical Decision Making Stable/Uncomplicated    Rehab Potential Fair    PT Frequency 2x / week    PT Duration 8 weeks   additional 4 weeks   PT Treatment/Interventions ADLs/Self Care Home Management;Aquatic Therapy;Electrical Stimulation;Cryotherapy;Moist Heat;Balance training;Therapeutic exercise;Therapeutic activities;Iontophoresis 4mg /ml Dexamethasone;Functional mobility training;Gait training;Stair training;DME Instruction;Neuromuscular re-education;Patient/family education;Manual techniques;Dry needling;Passive range of motion;Joint Manipulations    PT Next Visit Plan Ensure pain in knees has decreased, begin aquatics as able.  Focus on quad strength and MONITOR for hyper-extension - goal to reduce hyperextension in functional activities.    PT Home Exercise Plan stop bridge, SLR WITHOUT hyper extension 12/9 LAQ; 12/16: hamstring set; , SAQ 12/21 mini squat, heel raise, TR, marching, hamstring curls           Patient will benefit from skilled therapeutic intervention in order to improve the following deficits and impairments:  Abnormal gait,Decreased endurance,Pain,Decreased strength,Decreased knowledge of use of DME,Decreased activity tolerance,Decreased  balance,Decreased mobility,Difficulty walking,Improper body mechanics,Decreased range of motion  Visit Diagnosis: Chronic pain of left knee  Difficulty in walking, not elsewhere classified  Muscle weakness (generalized)  Problem List Patient Active Problem List   Diagnosis Date Noted  . Vitamin D deficiency 07/17/2020  . Mixed hyperlipidemia 07/17/2020  . Epidermal cyst of vulva 05/29/2020  . Itching in the vaginal area 05/29/2020  . Gums, bleeding 02/21/2020  . Screening for colorectal cancer 11/29/2019  . Encounter for gynecological examination with Papanicolaou smear of cervix 11/29/2019  . Morbid obesity ([facility]) 11/29/2019  . PMS (premenstrual syndrome) 11/14/2019  . Dysmenorrhea 11/14/2019  . Menorrhagia with regular cycle 11/14/2019  . Breast nodule 01/02/2015  . Morbid obesity with BMI of 60.0-69.9, adult ([facility]) 09/21/2014  . Obesity, BMI unknown 05/04/2014  . ASCUS with positive high risk HPV 02/22/2014  . Contraception management 01/06/2013  . Recurrent UTI 01/06/2013  . Hypothyroidism 11/01/2009  . ANXIETY DEPRESSION 11/01/2009  . ALLERGIC ASTHMA 11/01/2009  . HEMATOCHEZIA 11/01/2009  .  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 PERSONAL [facility] OTH THAN MEDICINAL AGTS 11/01/2009    Virgina Organ, PT CLT [phone] 10/18/2020, 9:25 AM  [facility] [address] [place], [place], [address] Phone: [phone]   Fax:  [phone]  Name: [name] MRN: [id number] Date of Birth: [birth year]

## 2020-10-22 ENCOUNTER — Ambulatory Visit (HOSPITAL_COMMUNITY): Payer: BC Managed Care – PPO | Admitting: Physical Therapy

## 2020-10-22 ENCOUNTER — Other Ambulatory Visit: Payer: Self-pay

## 2020-10-22 DIAGNOSIS — M25562 Pain in left knee: Secondary | ICD-10-CM

## 2020-10-22 DIAGNOSIS — G8929 Other chronic pain: Secondary | ICD-10-CM | POA: Diagnosis not present

## 2020-10-22 DIAGNOSIS — R262 Difficulty in walking, not elsewhere classified: Secondary | ICD-10-CM | POA: Diagnosis not present

## 2020-10-22 DIAGNOSIS — M6281 Muscle weakness (generalized): Secondary | ICD-10-CM

## 2020-10-22 NOTE — Therapy (Signed)
[facility] [address] [place], [place], [address] Phone: [phone]   Fax:  [phone]  Physical Therapy Treatment  Patient Details  Name: [name] MRN: [id number] Date of Birth: 10-06-1976 Referring Provider (PT): Darreld Mclean   Encounter Date: 10/22/2020   PT End of Session - 10/22/20 1652    Visit Number 10    Number of Visits 15    Date for PT Re-Evaluation 11/16/20   8 visits over a 5 week period   Authorization Type BCBS Comm PPO  - no auth required, VL [id number]    Progress Note Due on Visit 15    PT Start Time 1620    PT Stop Time 1700    PT Time Calculation (min) 40 min    Activity Tolerance Patient limited by fatigue;Patient limited by pain;Patient tolerated treatment well    Behavior During Therapy [facility] for tasks assessed/performed           Past Medical History:  Diagnosis Date  . Allergy   . Anxiety   . Arthritis   . Asthma   . Breast nodule 01/02/2015  . Depression   . Fatigue   . GERD (gastroesophageal reflux disease)   . Herpes   . HPV in female   . Obesity   . PMDD (premenstrual dysphoric disorder)   . Thyroid disease    hypothryoidism  . Trauma   . Vaginal Pap smear, abnormal   . Vitamin D deficiency     Past Surgical History:  Procedure Laterality Date  . CESAREAN SECTION    . CHOLECYSTECTOMY    . TONSILECTOMY/ADENOIDECTOMY WITH MYRINGOTOMY    . WISDOM TOOTH EXTRACTION      There were no vitals filed for this visit.   Subjective Assessment - 10/22/20 1654    Subjective pt states the heat really helped last time.  STates she is bummed not to be able to start aquatics today (therapist is out) and this is why she is here today taking land therapy.  States both her knees hurt today, Rt worse than Lt.    Currently in Pain? Yes    Pain Score 6     Pain Location Knee    Pain Orientation Right;Left    Pain Descriptors / Indicators Aching;Sore;Burning                              [facility] Adult PT Treatment/Exercise - 10/22/20 0001      Knee/Hip Exercises: Supine   Quad Sets Both;10 reps;3 sets    Short Arc Quad Sets Both;3 sets;10 reps    Hip Adduction Isometric Both;10 reps;3 sets    Straight Leg Raises Both;10 reps;2 sets    Other Supine Knee/Hip Exercises marching in place X 10 reps 2 sets; feet on ball roll into flexion hold 3 seconds x 10, 2 sets    Other Supine Knee/Hip Exercises ab , ham and glut set 3 sets of 10 while heat on knees                    PT Short Term Goals - 10/11/20 0900      PT SHORT TERM GOAL #1   Title Patient will report at least 25% improvement in overall symptoms and/or function to demonstrate improved functional mobility    Time 2    Period Weeks    Status On-going    Target Date 09/27/20  PT SHORT TERM GOAL #2   Title Patient will be independent in self management strategies to improve quality of life and functional outcomes.    Time 2    Period Weeks    Status On-going    Target Date 09/27/20             PT Long Term Goals - 10/11/20 0900      PT LONG TERM GOAL #1   Title Patient will report at least 50% improvement in overall symptoms and/or function to demonstrate improved functional mobility    Time 4    Period Weeks    Status On-going      PT LONG TERM GOAL #2   Title Patient will improve on FOTO score to meet predicted outcomes to demonstrate improved functional mobility.    Time 4    Period Weeks    Status On-going      PT LONG TERM GOAL #3   Title Patient will be able to demonstrate painfree left knee ROM in supine.    Time 4    Period Weeks    Status Achieved                 Plan - 10/22/20 1652    Clinical Impression Statement Pt with continued knee pain; returns to MD tomorrow to discuss MRI results and further action of care.  Started with moist heat on bil knees with gentle sets of isometric and concentric LE exercises.  Able to  complete 3 sets of 10 reps of most exercises.  More pain today in Rt knee than LE and mostly on the lateral aspect.  Little to no pain voiced with Lt LE when completing therex.    Personal Factors and Comorbidities Comorbidity 1;Comorbidity 2;Fitness    Comorbidities obese, history of bilateral knee pain    Examination-Activity Limitations Squat;Stairs;Stand;Transfers;Locomotion Level    Examination-Participation Restrictions Occupation;Laundry;Community Activity;Cleaning    Stability/Clinical Decision Making Stable/Uncomplicated    Rehab Potential Fair    PT Frequency 2x / week    PT Duration 8 weeks   additional 4 weeks   PT Treatment/Interventions ADLs/Self Care Home Management;Aquatic Therapy;Electrical Stimulation;Cryotherapy;Moist Heat;Balance training;Therapeutic exercise;Therapeutic activities;Iontophoresis 4mg /ml Dexamethasone;Functional mobility training;Gait training;Stair training;DME Instruction;Neuromuscular re-education;Patient/family education;Manual techniques;Dry needling;Passive range of motion;Joint Manipulations    PT Next Visit Plan Ensure pain in knees has decreased, begin aquatics as able.  Focus on quad strength and MONITOR for hyper-extension - goal to reduce hyperextension in functional activities.    PT Home Exercise Plan stop bridge, SLR WITHOUT hyper extension 12/9 LAQ; 12/16: hamstring set; , SAQ 12/21 mini squat, heel raise, TR, marching, hamstring curls           Patient will benefit from skilled therapeutic intervention in order to improve the following deficits and impairments:  Abnormal gait,Decreased endurance,Pain,Decreased strength,Decreased knowledge of use of DME,Decreased activity tolerance,Decreased balance,Decreased mobility,Difficulty walking,Improper body mechanics,Decreased range of motion  Visit Diagnosis: Muscle weakness (generalized)  Chronic pain of left knee  Difficulty in walking, not elsewhere classified     Problem List Patient  Active Problem List   Diagnosis Date Noted  . Vitamin D deficiency 07/17/2020  . Mixed hyperlipidemia 07/17/2020  . Epidermal cyst of vulva 05/29/2020  . Itching in the vaginal area 05/29/2020  . Gums, bleeding 02/21/2020  . Screening for colorectal cancer 11/29/2019  . Encounter for gynecological examination with Papanicolaou smear of cervix 11/29/2019  . Morbid obesity (HCC) 11/29/2019  . PMS (premenstrual syndrome) 11/14/2019  . Dysmenorrhea 11/14/2019  .  Menorrhagia with regular cycle 11/14/2019  . Breast nodule 01/02/2015  . Morbid obesity with BMI of 60.0-69.9, adult (HCC) 09/21/2014  . Obesity, BMI unknown 05/04/2014  . ASCUS with positive high risk HPV 02/22/2014  . Contraception management 01/06/2013  . Recurrent UTI 01/06/2013  . Hypothyroidism 11/01/2009  . ANXIETY DEPRESSION 11/01/2009  . ALLERGIC ASTHMA 11/01/2009  . HEMATOCHEZIA 11/01/2009  .  LATEX ALLERGY  11/01/2009  . PERSONAL HX OTH ALLERG OTH THAN MEDICINAL AGTS 11/01/2009   Lurena Nida, PTA/CLT [phone]   Lurena Nida 10/22/2020, 4:56 PM  [facility] [address] [place], [place], [address] Phone: [phone]   Fax:  [phone]  Name: CHLORA MCBAIN MRN: [id number] Date of Birth: 11/22/1975

## 2020-10-23 ENCOUNTER — Ambulatory Visit: Payer: BC Managed Care – PPO | Admitting: Orthopedic Surgery

## 2020-10-23 ENCOUNTER — Encounter: Payer: Self-pay | Admitting: Orthopedic Surgery

## 2020-10-23 VITALS — Ht 64.0 in

## 2020-10-23 DIAGNOSIS — M25562 Pain in left knee: Secondary | ICD-10-CM

## 2020-10-23 DIAGNOSIS — G8929 Other chronic pain: Secondary | ICD-10-CM | POA: Diagnosis not present

## 2020-10-23 DIAGNOSIS — Z6841 Body Mass Index (BMI) 40.0 and over, adult: Secondary | ICD-10-CM | POA: Diagnosis not present

## 2020-10-23 NOTE — Progress Notes (Signed)
New Patient Visit  Assessment: [name] is a 45 y.o. female with the following: Chronic left knee pain Morbid obesity; BMI > 85  Plan: Reviewed MRI with the patient which is without acute intra-articular pathology.  There are no issues to address with surgery.  Recommend she continue to work with PT, and I think aquatic therapy can be helpful.  Unfortunately, there is no distinct issue causing her pain, but anterior knee pain often responds well to therapy.  She has a number of medical concerns that are contributing to her morbid obesity, but we stressed any amount of weight loss can reduce the amount of stress through her knees, which can help with the pain.  Do not recommend narcotics for ongoing pain.  She should get refills from previous  prescribing physician.    The patient meets the AMA guidelines for Morbid obesity with BMI > 40.  The patient has been counseled on weight loss.   Follow-up: Return if symptoms worsen or fail to improve.  Subjective:  Chief Complaint  Patient presents with  . Knee Pain    Patient reports that left knee is worse than right     History of Present Illness: [name] is a 45 y.o. female who presents for evaluation of her left knee.  She has previously been take care of by Dr. Hilda Lias, who ordered an MRI due to concern for a meniscus tear.  She has had primarily anterior knee pain for a couple of months.  She was taking ibuprofen for pain, but more recently has been taking norco.  She had an injection that did not provide any relief.  She has been doing PT, but notes this has caused some pain in her right knee.  No distinct injury in either knee.  She reports she has not had any obvious swelling in the knee.    Review of Systems: No fevers or chills No numbness or tingling No chest pain No shortness of breath No bowel or bladder dysfunction No GI distress No headaches   Medical History:  Past Medical History:  Diagnosis Date   . Allergy   . Anxiety   . Arthritis   . Asthma   . Breast nodule 01/02/2015  . Depression   . Fatigue   . GERD (gastroesophageal reflux disease)   . Herpes   . HPV in female   . Obesity   . PMDD (premenstrual dysphoric disorder)   . Thyroid disease    hypothryoidism  . Trauma   . Vaginal Pap smear, abnormal   . Vitamin D deficiency     Past Surgical History:  Procedure Laterality Date  . CESAREAN SECTION    . CHOLECYSTECTOMY    . TONSILECTOMY/ADENOIDECTOMY WITH MYRINGOTOMY    . WISDOM TOOTH EXTRACTION      Family History  Problem Relation Age of Onset  . Hypertension Mother   . Arthritis Mother   . Anxiety disorder Mother   . Asthma Father   . COPD Father   . Arthritis Father   . Chronic bronchitis Father   . Hypertension Father   . Hyperlipidemia Father   . Anxiety disorder Father   . Depression Father   . Alcohol abuse Father   . Drug abuse Brother   . Alcohol abuse Brother   . Hypertension Brother   . Mental illness Brother   . Depression Brother   . Cancer Maternal Aunt   . Cancer Maternal Uncle   . Cancer Paternal Aunt   .  Cancer Paternal Uncle   . Asthma Maternal Grandmother   . Arthritis Maternal Grandmother   . Hypertension Maternal Grandmother   . Congestive Heart Failure Maternal Grandmother   . Diabetes Maternal Grandfather   . Stroke Maternal Grandfather   . ADD / ADHD Son   . Hyperlipidemia Brother   . Hearing loss Brother   . Asthma Brother   . Learning disabilities Brother    Social History   Tobacco Use  . Smoking status: Former Games developer  . Smokeless tobacco: Never Used  . Tobacco comment: light smoker as a teenager   Vaping Use  . Vaping Use: Never used  Substance Use Topics  . Alcohol use: Yes    Comment: occ  . Drug use: No    Allergies  Allergen Reactions  . Latex   . Naproxen   . Shrimp [Shellfish Allergy] Other (See Comments)    Intolerance     Current Meds  Medication Sig  . augmented betamethasone  dipropionate (DIPROLENE-AF) 0.05 % cream   . busPIRone (BUSPAR) 10 MG tablet Take 2 tablets (20 mg total) by mouth 2 (two) times daily.  . calcium-vitamin D (OSCAL WITH D) 500-200 MG-UNIT tablet Take 1 tablet by mouth.  . cetirizine (ZYRTEC) 10 MG tablet Take 10 mg by mouth daily.  . divalproex (DEPAKOTE ER) 500 MG 24 hr tablet TAKE 1 TABLET(500 MG) BY MOUTH DAILY  . HYDROcodone-acetaminophen (NORCO/VICODIN) 5-325 MG tablet One tablet every four hours as needed for acute pain.  Limit of five days per [place].  Marland Kitchen ibuprofen (ADVIL) 600 MG tablet Take 600 mg by mouth 3 (three) times daily.  Marland Kitchen levalbuterol (XOPENEX HFA) 45 MCG/ACT inhaler Inhale into the lungs every 4 (four) hours as needed for wheezing.  Marland Kitchen levothyroxine (SYNTHROID) 200 MCG tablet Take 1 tablet (200 mcg total) by mouth daily before breakfast.  . levothyroxine (SYNTHROID) 75 MCG tablet Take 1 tablet (75 mcg total) by mouth daily.  Marland Kitchen MELATONIN PO Take by mouth at bedtime.  . Misc Natural Products (CRANBERRY/PROBIOTIC PO) Take by mouth daily. Cranberry/Probiotic/Prebiotic Blend-2 daily  . montelukast (SINGULAIR) 10 MG tablet Take 10 mg by mouth daily.   . Multiple Vitamins-Minerals (WOMENS MULTI VITAMIN & MINERAL PO) Take by mouth daily.  . norethindrone (AYGESTIN) 5 MG tablet TAKE 2 TABLETS BY MOUTH DAILY  . venlafaxine XR (EFFEXOR-XR) 75 MG 24 hr capsule Take 3 capsules (225 mg total) by mouth daily with breakfast.    Objective: Ht 5\' 4"  (1.626 m)   BMI 85.82 kg/m   Physical Exam:  General:  Obese female, no acute distress Gait:  Uses a cane to assist with ambulation.   Left knee difficult to examine due to morbid obesity.  No obvious swelling.  Tenderness to palpation around the knee cap.  No tenderness to either medial or lateral joint line.  No gross instability.  TTP posterior knee.  ROM from 0-100 degrees.  5/5 quad/hamstring strength   IMAGING: I personally reviewed images previously obtained in clinic   XR  and MRI of the left knee demonstrates mild degenerative changes overall.  No injuries to the meniscus.  Small Baker's cyst.    New Medications:  No orders of the defined types were placed in this encounter.     , MD  10/23/2020 9:27 AM

## 2020-10-23 NOTE — Progress Notes (Signed)
Patient reports that both knees are bothering her since last Physical Therapy session.

## 2020-10-25 ENCOUNTER — Other Ambulatory Visit: Payer: Self-pay

## 2020-10-25 ENCOUNTER — Encounter (HOSPITAL_COMMUNITY): Payer: Self-pay

## 2020-10-25 ENCOUNTER — Ambulatory Visit (INDEPENDENT_AMBULATORY_CARE_PROVIDER_SITE_OTHER): Payer: BC Managed Care – PPO | Admitting: Psychiatry

## 2020-10-25 ENCOUNTER — Ambulatory Visit (HOSPITAL_COMMUNITY): Payer: BC Managed Care – PPO

## 2020-10-25 DIAGNOSIS — R262 Difficulty in walking, not elsewhere classified: Secondary | ICD-10-CM | POA: Diagnosis not present

## 2020-10-25 DIAGNOSIS — F411 Generalized anxiety disorder: Secondary | ICD-10-CM | POA: Diagnosis not present

## 2020-10-25 DIAGNOSIS — M25562 Pain in left knee: Secondary | ICD-10-CM | POA: Diagnosis not present

## 2020-10-25 DIAGNOSIS — F331 Major depressive disorder, recurrent, moderate: Secondary | ICD-10-CM

## 2020-10-25 DIAGNOSIS — M6281 Muscle weakness (generalized): Secondary | ICD-10-CM | POA: Diagnosis not present

## 2020-10-25 DIAGNOSIS — G8929 Other chronic pain: Secondary | ICD-10-CM

## 2020-10-25 NOTE — Therapy (Signed)
[place], [place], [address] Phone: [phone]   Fax:  [phone]  Physical Therapy Treatment  Patient Details  Name: [name] MRN: [id number] Date of Birth: 01/12/1976 Referring Provider (PT): Sanjuana Kava   Encounter Date: 10/25/2020   PT End of Session - 10/25/20 0845    Visit Number 11    Number of Visits 15    Date for PT Re-Evaluation 11/16/20   8 visits over a 5 week period   Authorization Type BCBS Comm PPO  - no auth required, VL [id number]    Progress Note Due on Visit 15    PT Start Time 0847    PT Stop Time 0932    PT Time Calculation (min) 45 min    Activity Tolerance Patient limited by fatigue;Patient limited by pain;Patient tolerated treatment well    Behavior During Therapy [facility] for tasks assessed/performed           Past Medical History:  Diagnosis Date  . Allergy   . Anxiety   . Arthritis   . Asthma   . Breast nodule 01/02/2015  . Depression   . Fatigue   . GERD (gastroesophageal reflux disease)   . Herpes   . HPV in female   . Obesity   . PMDD (premenstrual dysphoric disorder)   . Thyroid disease    hypothryoidism  . Trauma   . Vaginal Pap smear, abnormal   . Vitamin D deficiency     Past Surgical History:  Procedure Laterality Date  . CESAREAN SECTION    . CHOLECYSTECTOMY    . TONSILECTOMY/ADENOIDECTOMY WITH MYRINGOTOMY    . WISDOM TOOTH EXTRACTION      There were no vitals filed for this visit.   Subjective Assessment - 10/25/20 0845    Subjective Patient reoprts MD visit yesterday and review of MRI - MD stated no structural damage; he recommended patient push herself harder and give PT more time to work.    Currently in Pain? Yes    Pain Score 4     Pain Location Knee    Pain Orientation Right;Left             [facility] Adult PT Treatment/Exercise - 10/25/20 0001      Self-Care   Self-Care Other Self-Care Comments    Other Self-Care Comments  nutrition  intake, self pacing, enrergy conservation techniques, integrative medicine consult      Knee/Hip Exercises: Seated   Long Arc Quad Both;10 reps    Long Arc Quad Limitations 5 second holds 2 sets, cueing to reduce hyperextension      Knee/Hip Exercises: Supine   Short Arc Quad Sets Both;3 sets;10 reps    Other Supine Knee/Hip Exercises marching in place X 10 reps 2 sets; feet on ball roll into flexion hold 3 seconds x 10, 2 sets             PT Education - 10/25/20 0859    Education Details Patient educated on HEP, exercise mechanics. Nutrition intake, self pacing, enrergy conservation techniques, integrative medicine consult.    Person(s) Educated Patient    Methods Explanation    Comprehension Verbalized understanding            PT Short Term Goals - 10/25/20 0846      PT SHORT TERM GOAL #1   Title Patient will report at least 25% improvement in overall symptoms and/or function to demonstrate improved functional mobility    Time  2    Period Weeks    Status On-going    Target Date 09/27/20      PT SHORT TERM GOAL #2   Title Patient will be independent in self management strategies to improve quality of life and functional outcomes.    Time 2    Period Weeks    Status On-going    Target Date 09/27/20             PT Long Term Goals - 10/25/20 0846      PT LONG TERM GOAL #1   Title Patient will report at least 50% improvement in overall symptoms and/or function to demonstrate improved functional mobility    Time 4    Period Weeks    Status On-going      PT LONG TERM GOAL #2   Title Patient will improve on FOTO score to meet predicted outcomes to demonstrate improved functional mobility.    Time 4    Period Weeks    Status On-going      PT LONG TERM GOAL #3   Title Patient will be able to demonstrate painfree left knee ROM in supine.    Time 4    Period Weeks    Status Achieved             Plan - 10/25/20 0846    Clinical Impression Statement Pt with  continued knee pain; per patient reports MRI did not show any structural damage. MD would like patient to continue with PT. Patient labile today. Discussion regarding nutrition intake, self-pacing, energy conservation techniques, integrative medicine consult to address myriad of Hashimoto's autoimmunity symptoms. Performed seated and bed level exercises. Patient did not report pain at end of session or while performing exercises today.    Personal Factors and Comorbidities Comorbidity 1;Comorbidity 2;Fitness    Comorbidities obese, history of bilateral knee pain    Examination-Activity Limitations Squat;Stairs;Stand;Transfers;Locomotion Level    Examination-Participation Restrictions Occupation;Laundry;Community Activity;Cleaning    Stability/Clinical Decision Making Stable/Uncomplicated    Rehab Potential Fair    PT Frequency 2x / week    PT Duration 8 weeks   additional 4 weeks   PT Treatment/Interventions ADLs/Self Care Home Management;Aquatic Therapy;Electrical Stimulation;Cryotherapy;Moist Heat;Balance training;Therapeutic exercise;Therapeutic activities;Iontophoresis 4mg /ml Dexamethasone;Functional mobility training;Gait training;Stair training;DME Instruction;Neuromuscular re-education;Patient/family education;Manual techniques;Dry needling;Passive range of motion;Joint Manipulations    PT Next Visit Plan Ensure pain in knees has decreased, begin aquatics as able.  Focus on quad strength and MONITOR for hyper-extension - goal to reduce hyperextension in functional activities.    PT Home Exercise Plan stop bridge, SLR WITHOUT hyper extension 12/9 LAQ; 12/16: hamstring set; , SAQ 12/21 mini squat, heel raise, TR, marching, hamstring curls           Patient will benefit from skilled therapeutic intervention in order to improve the following deficits and impairments:  Abnormal gait,Decreased endurance,Pain,Decreased strength,Decreased knowledge of use of DME,Decreased activity tolerance,Decreased  balance,Decreased mobility,Difficulty walking,Improper body mechanics,Decreased range of motion  Visit Diagnosis: Muscle weakness (generalized)  Chronic pain of left knee  Difficulty in walking, not elsewhere classified     Problem List Patient Active Problem List   Diagnosis Date Noted  . Vitamin D deficiency 07/17/2020  . Mixed hyperlipidemia 07/17/2020  . Epidermal cyst of vulva 05/29/2020  . Itching in the vaginal area 05/29/2020  . Gums, bleeding 02/21/2020  . Screening for colorectal cancer 11/29/2019  . Encounter for gynecological examination with Papanicolaou smear of cervix 11/29/2019  . Morbid obesity ([place]) 11/29/2019  . PMS (  premenstrual syndrome) 11/14/2019  . Dysmenorrhea 11/14/2019  . Menorrhagia with regular cycle 11/14/2019  . Breast nodule 01/02/2015  . Morbid obesity with BMI of 60.0-69.9, adult ([place]) 09/21/2014  . Obesity, BMI unknown 05/04/2014  . ASCUS with positive high risk HPV 02/22/2014  . Contraception management 01/06/2013  . Recurrent UTI 01/06/2013  . Hypothyroidism 11/01/2009  . ANXIETY DEPRESSION 11/01/2009  . ALLERGIC ASTHMA 11/01/2009  . HEMATOCHEZIA 11/01/2009  . 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 PERSONAL HX OTH ALLERG OTH THAN MEDICINAL AGTS 11/01/2009    Floria Raveling. Hartnett-Rands, MS, PT [id number] 10/25/2020, 10:45 AM  [place], [place], [address] Phone: [phone]   Fax:  [phone]  Name: [name] MRN: [id number] Date of Birth: [birth year]
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
## 2020-10-29 ENCOUNTER — Ambulatory Visit (HOSPITAL_COMMUNITY): Payer: BC Managed Care – PPO | Admitting: Physical Therapy

## 2020-10-31 ENCOUNTER — Telehealth (HOSPITAL_COMMUNITY): Payer: Self-pay | Admitting: Physical Therapy

## 2020-10-31 NOTE — Telephone Encounter (Signed)
pt cancelled pending husbands COVID test

## 2020-11-01 ENCOUNTER — Other Ambulatory Visit: Payer: Self-pay

## 2020-11-01 ENCOUNTER — Telehealth (INDEPENDENT_AMBULATORY_CARE_PROVIDER_SITE_OTHER): Payer: BC Managed Care – PPO | Admitting: Psychiatry

## 2020-11-01 ENCOUNTER — Ambulatory Visit (HOSPITAL_COMMUNITY): Payer: BC Managed Care – PPO | Admitting: Physical Therapy

## 2020-11-01 DIAGNOSIS — F5105 Insomnia due to other mental disorder: Secondary | ICD-10-CM | POA: Diagnosis not present

## 2020-11-01 DIAGNOSIS — R454 Irritability and anger: Secondary | ICD-10-CM | POA: Diagnosis not present

## 2020-11-01 DIAGNOSIS — F99 Mental disorder, not otherwise specified: Secondary | ICD-10-CM

## 2020-11-01 DIAGNOSIS — F411 Generalized anxiety disorder: Secondary | ICD-10-CM

## 2020-11-01 DIAGNOSIS — F331 Major depressive disorder, recurrent, moderate: Secondary | ICD-10-CM | POA: Diagnosis not present

## 2020-11-01 MED ORDER — VENLAFAXINE HCL ER 75 MG PO CP24: 225.0000 mg | ORAL_CAPSULE | Freq: Every day | ORAL | 0 refills | Status: DC

## 2020-11-01 MED ORDER — ZIPRASIDONE HCL 20 MG PO CAPS: 20.0000 mg | ORAL_CAPSULE | Freq: Two times a day (BID) | ORAL | 0 refills | Status: DC

## 2020-11-01 MED ORDER — BUSPIRONE HCL 10 MG PO TABS: 20.0000 mg | ORAL_TABLET | Freq: Two times a day (BID) | ORAL | 0 refills | Status: DC

## 2020-11-01 MED ORDER — ALPRAZOLAM 0.5 MG PO TABS: 0.5000 mg | ORAL_TABLET | Freq: Two times a day (BID) | ORAL | 0 refills | Status: DC | PRN

## 2020-11-01 NOTE — Progress Notes (Signed)
Virtual Visit via Telephone Note  I connected with [name] on 11/01/20 at  8:45 AM EST by telephone and verified that I am speaking with the correct person using two identifiers. [name] states she can not connect to [employer] so we continued by phone.   Location: Patient: home Provider: office   I discussed the limitations, risks, security and privacy concerns of performing an evaluation and management service by telephone and the availability of in person appointments. I also discussed with the patient that there may be a patient responsible charge related to this service. The patient expressed understanding and agreed to proceed.   History of Present Illness: [name] has been having worsening depression. There are days where she goes from bed to the couch. She is crying more often and often feels she can't control her feelings or reactions. She is endorsing anhedonia and some desire to isolate. Her motivation and energy are low. She in sleeping well. She is endorsing on/off passive thoughts of death. She denies SI/HI. [name] has some anger and irritability occurring at work but the depression is most prominent. She is having leg pain that seems worse when she is depressed. The depression is not allowing her determine her level of recovery. The medication is not helping. She is frustrated because the Buspar only helped for a little while. The Depakote does help with her anxiety but not the depression. She is also gaining weight and therefore would like to stop Depakote.    Observations/Objective:  General Appearance: unable to assess  Eye Contact:  unable to assess  Speech:  Clear and Coherent and Normal Rate  Volume:  Normal  Mood:  Depressed  Affect:  Congruent  Thought Process:  Goal Directed, Linear and Descriptions of Associations: Intact  Orientation:  Full (Time, Place, and Person)  Thought Content:  Logical  Suicidal Thoughts:  No  Homicidal Thoughts:  No  Memory:   Immediate;   Good  Judgement:  Good  Insight:  Good  Psychomotor Activity: unable to assess  Concentration:  Concentration: Good  Recall:  Good  Fund of Knowledge:  Good  Language:  Good  Akathisia:  unable to assess  Handed:  Right  AIMS (if indicated):     Assets:  Communication Skills Desire for Improvement Financial Resources/Insurance Housing Resilience Social Support Talents/Skills Transportation Vocational/Educational  ADL's:  unable to assess  Cognition:  WNL  Sleep:         Assessment and Plan: 1. MDD (major depressive disorder), recurrent episode, moderate (HCC) - start ziprasidone (GEODON) 20 MG capsule; Take 1 capsule (20 mg total) by mouth 2 (two) times daily with a meal.  Dispense: 60 capsule; Refill: 0 - busPIRone (BUSPAR) 10 MG tablet; Take 2 tablets (20 mg total) by mouth 2 (two) times daily.  Dispense: 360 tablet; Refill: 0 - venlafaxine XR (EFFEXOR-XR) 75 MG 24 hr capsule; Take 3 capsules (225 mg total) by mouth daily with breakfast.  Dispense: 270 capsule; Refill: 0  2. GAD (generalized anxiety disorder) - busPIRone (BUSPAR) 10 MG tablet; Take 2 tablets (20 mg total) by mouth 2 (two) times daily.  Dispense: 360 tablet; Refill: 0 - venlafaxine XR (EFFEXOR-XR) 75 MG 24 hr capsule; Take 3 capsules (225 mg total) by mouth daily with breakfast.  Dispense: 270 capsule; Refill: 0 - ALPRAZolam (XANAX) 0.5 MG tablet; Take 1 tablet (0.5 mg total) by mouth 2 (two) times daily as needed for anxiety.  Dispense: 60 tablet; Refill: 0 given 30 day supply with  no refills. Medication is temporary  3. Irritability and anger - ziprasidone (GEODON) 20 MG capsule; Take 1 capsule (20 mg total) by mouth 2 (two) times daily with a meal.  Dispense: 60 capsule; Refill: 0  4. Insomnia due to other mental disorder  D/c Depakote  Reviewed EKG 11/04/2018 and it was normal with QTc 426   Follow Up Instructions: In 3 weeks or sooner if needed   I discussed the assessment and  treatment plan with the patient. The patient was provided an opportunity to ask questions and all were answered. The patient agreed with the plan and demonstrated an understanding of the instructions.   The patient was advised to call back or seek an in-person evaluation if the symptoms worsen or if the condition fails to improve as anticipated.  I provided 17 minutes of non-face-to-face time during this encounter.   [name], MD

## 2020-11-05 ENCOUNTER — Other Ambulatory Visit: Payer: Self-pay

## 2020-11-05 ENCOUNTER — Encounter (HOSPITAL_COMMUNITY): Payer: Self-pay | Admitting: Physical Therapy

## 2020-11-05 ENCOUNTER — Ambulatory Visit (HOSPITAL_COMMUNITY): Payer: BC Managed Care – PPO | Admitting: Physical Therapy

## 2020-11-05 DIAGNOSIS — G8929 Other chronic pain: Secondary | ICD-10-CM | POA: Diagnosis not present

## 2020-11-05 DIAGNOSIS — M6281 Muscle weakness (generalized): Secondary | ICD-10-CM | POA: Diagnosis not present

## 2020-11-05 DIAGNOSIS — M25562 Pain in left knee: Secondary | ICD-10-CM | POA: Diagnosis not present

## 2020-11-05 DIAGNOSIS — R262 Difficulty in walking, not elsewhere classified: Secondary | ICD-10-CM | POA: Diagnosis not present

## 2020-11-05 NOTE — Patient Instructions (Signed)
Access Code: [email or web address] URL: [email or web address] Date: 11/05/2020 Prepared by: Georges Lynch  Exercises Standing Terminal Knee Extension with Resistance - 2 x daily - 7 x weekly - 2 sets - 10 reps - 5 second hold Side Stepping with Resistance at Ankles - 2 x daily - 7 x weekly - 1 sets - 10 reps Standing Heel Raise with Support - 2 x daily - 7 x weekly - 2 sets - 10 reps

## 2020-11-05 NOTE — Therapy (Signed)
[facility] [address] [place], [place], [address] Phone: [phone]   Fax:  [phone]  Physical Therapy Treatment  Patient Details  Name: [name] MRN: [id number] Date of Birth: 1976/02/20 Referring Provider (PT): Darreld Mclean   Encounter Date: 11/05/2020   PT End of Session - 11/05/20 1615    Visit Number 12    Number of Visits 15    Date for PT Re-Evaluation 11/16/20   8 visits over a 5 week period   Authorization Type BCBS Comm PPO  - no auth required, VL 30    Progress Note Due on Visit 15    PT Start Time 1607    PT Stop Time 1646    PT Time Calculation (min) 39 min    Activity Tolerance Patient tolerated treatment well    Behavior During Therapy [facility] for tasks assessed/performed           Past Medical History:  Diagnosis Date  . Allergy   . Anxiety   . Arthritis   . Asthma   . Breast nodule 01/02/2015  . Depression   . Fatigue   . GERD (gastroesophageal reflux disease)   . Herpes   . HPV in female   . Obesity   . PMDD (premenstrual dysphoric disorder)   . Thyroid disease    hypothryoidism  . Trauma   . Vaginal Pap smear, abnormal   . Vitamin D deficiency     Past Surgical History:  Procedure Laterality Date  . CESAREAN SECTION    . CHOLECYSTECTOMY    . TONSILECTOMY/ADENOIDECTOMY WITH MYRINGOTOMY    . WISDOM TOOTH EXTRACTION      There were no vitals filed for this visit.   Subjective Assessment - 11/05/20 1614    Subjective Patient says she was able to do more exercise this weekend. Says she feels she can tolerate a little more.    Currently in Pain? Yes    Pain Score 2     Pain Location Knee    Pain Orientation Right;Left    Pain Descriptors / Indicators Aching    Pain Type Chronic pain    Pain Onset More than a month ago    Pain Frequency Constant                             [facility] Adult PT Treatment/Exercise - 11/05/20 0001      Knee/Hip Exercises: Standing    Heel Raises Both;2 sets;10 reps    Knee Flexion Both;2 sets;10 reps    Terminal Knee Extension Both;1 set;10 reps    Theraband Level (Terminal Knee Extension) Level 2 (Red)    Terminal Knee Extension Limitations 3 sec hold    Hip Abduction Both;1 set;10 reps    Abduction Limitations RTB    Hip Extension Both;1 set;10 reps    Extension Limitations RTB    Other Standing Knee Exercises band sidestepping 10 x length of table      Knee/Hip Exercises: Seated   Long Arc Quad Both;2 sets;10 reps;Weights    Long Arc Quad Weight 5 lbs.    Sit to Sand 20 reps   2 x 10 with table elevated                   PT Short Term Goals - 10/25/20 0846      PT SHORT TERM GOAL #1   Title Patient will report at least  25% improvement in overall symptoms and/or function to demonstrate improved functional mobility    Time 2    Period Weeks    Status On-going    Target Date 09/27/20      PT SHORT TERM GOAL #2   Title Patient will be independent in self management strategies to improve quality of life and functional outcomes.    Time 2    Period Weeks    Status On-going    Target Date 09/27/20             PT Long Term Goals - 10/25/20 0846      PT LONG TERM GOAL #1   Title Patient will report at least 50% improvement in overall symptoms and/or function to demonstrate improved functional mobility    Time 4    Period Weeks    Status On-going      PT LONG TERM GOAL #2   Title Patient will improve on FOTO score to meet predicted outcomes to demonstrate improved functional mobility.    Time 4    Period Weeks    Status On-going      PT LONG TERM GOAL #3   Title Patient will be able to demonstrate painfree left knee ROM in supine.    Time 4    Period Weeks    Status Achieved                 Plan - 11/05/20 1700    Clinical Impression Statement Patient tolerated ther ex progressions well overall today. Progressed patient to incorporate more WB activity for knee and LE  strengthening. Patient did note some discomfort with sit to stands, but was reduced with elevating seated surface. Educated patient on purpose and function of added exercise and issued updated HEP handout. Patient will continue to benefit from skilled therapy services to progress LE strength to reduce knee pain and improve functional mobility.    Personal Factors and Comorbidities Comorbidity 1;Comorbidity 2;Fitness    Comorbidities obese, history of bilateral knee pain    Examination-Activity Limitations Squat;Stairs;Stand;Transfers;Locomotion Level    Examination-Participation Restrictions Occupation;Laundry;Community Activity;Cleaning    Stability/Clinical Decision Making Stable/Uncomplicated    Rehab Potential Fair    PT Frequency 2x / week    PT Duration 8 weeks   additional 4 weeks   PT Treatment/Interventions ADLs/Self Care Home Management;Aquatic Therapy;Electrical Stimulation;Cryotherapy;Moist Heat;Balance training;Therapeutic exercise;Therapeutic activities;Iontophoresis 4mg /ml Dexamethasone;Functional mobility training;Gait training;Stair training;DME Instruction;Neuromuscular re-education;Patient/family education;Manual techniques;Dry needling;Passive range of motion;Joint Manipulations    PT Next Visit Plan Ensure pain in knees has decreased, begin aquatics as able.  Focus on quad strength and MONITOR for hyper-extension - goal to reduce hyperextension in functional activities.    PT Home Exercise Plan stop bridge, SLR WITHOUT hyper extension 12/9 LAQ; 12/16: hamstring set; , SAQ 12/21 mini squat, heel raise, TR, marching, hamstring curls 1/24 band sidestepping, TKE    Consulted and Agree with Plan of Care Patient           Patient will benefit from skilled therapeutic intervention in order to improve the following deficits and impairments:  Abnormal gait,Decreased endurance,Pain,Decreased strength,Decreased knowledge of use of DME,Decreased activity tolerance,Decreased  balance,Decreased mobility,Difficulty walking,Improper body mechanics,Decreased range of motion  Visit Diagnosis: Muscle weakness (generalized)  Chronic pain of left knee  Difficulty in walking, not elsewhere classified     Problem List Patient Active Problem List   Diagnosis Date Noted  . Vitamin D deficiency 07/17/2020  . Mixed hyperlipidemia 07/17/2020  . Epidermal cyst of vulva  05/29/2020  . Itching in the vaginal area 05/29/2020  . Gums, bleeding 02/21/2020  . Screening for colorectal cancer 11/29/2019  . Encounter for gynecological examination with Papanicolaou smear of cervix 11/29/2019  . Morbid obesity (HCC) 11/29/2019  . PMS (premenstrual syndrome) 11/14/2019  . Dysmenorrhea 11/14/2019  . Menorrhagia with regular cycle 11/14/2019  . Breast nodule 01/02/2015  . Morbid obesity with BMI of 60.0-69.9, adult (HCC) 09/21/2014  . Obesity, BMI unknown 05/04/2014  . ASCUS with positive high risk HPV 02/22/2014  . Contraception management 01/06/2013  . Recurrent UTI 01/06/2013  . Hypothyroidism 11/01/2009  . ANXIETY DEPRESSION 11/01/2009  . ALLERGIC ASTHMA 11/01/2009  . HEMATOCHEZIA 11/01/2009  . 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 PERSONAL HX OTH ALLERG OTH THAN MEDICINAL AGTS 11/01/2009    5:07 PM, 11/05/20 Georges Lynch PT DPT  Physical Therapist with [facility]  [facility]  [phone]   [facility] [address] [place], [place], [address] Phone: [phone]   Fax:  [phone]  Name: JANI MORONTA MRN: [id number] Date of Birth: [birth year]

## 2020-11-08 ENCOUNTER — Other Ambulatory Visit: Payer: Self-pay

## 2020-11-08 ENCOUNTER — Ambulatory Visit (HOSPITAL_COMMUNITY): Payer: BC Managed Care – PPO | Admitting: Physical Therapy

## 2020-11-08 ENCOUNTER — Encounter (HOSPITAL_COMMUNITY): Payer: Self-pay | Admitting: Physical Therapy

## 2020-11-08 DIAGNOSIS — R262 Difficulty in walking, not elsewhere classified: Secondary | ICD-10-CM | POA: Diagnosis not present

## 2020-11-08 DIAGNOSIS — G8929 Other chronic pain: Secondary | ICD-10-CM

## 2020-11-08 DIAGNOSIS — M25562 Pain in left knee: Secondary | ICD-10-CM | POA: Diagnosis not present

## 2020-11-08 DIAGNOSIS — M6281 Muscle weakness (generalized): Secondary | ICD-10-CM | POA: Diagnosis not present

## 2020-11-08 NOTE — Therapy (Signed)
[facility] [address] [place], [place], [address] Phone: [phone]   Fax:  [phone]  Physical Therapy Treatment  Patient Details  Name: [name] MRN: [id number] Date of Birth: 1976/04/13 Referring Provider (PT): Darreld Mclean   Encounter Date: 11/08/2020   PT End of Session - 11/08/20 3329    Visit Number 13    Number of Visits 15    Date for PT Re-Evaluation 11/16/20   8 visits over a 5 week period   Authorization Type [employer] Comm PPO  - no auth required, VL 30    Progress Note Due on Visit 15    PT Start Time 0830    PT Stop Time 0910    PT Time Calculation (min) 40 min    Activity Tolerance Patient tolerated treatment well    Behavior During Therapy [facility] for tasks assessed/performed           Past Medical History:  Diagnosis Date  . Allergy   . Anxiety   . Arthritis   . Asthma   . Breast nodule 01/02/2015  . Depression   . Fatigue   . GERD (gastroesophageal reflux disease)   . Herpes   . HPV in female   . Obesity   . PMDD (premenstrual dysphoric disorder)   . Thyroid disease    hypothryoidism  . Trauma   . Vaginal Pap smear, abnormal   . Vitamin D deficiency     Past Surgical History:  Procedure Laterality Date  . CESAREAN SECTION    . CHOLECYSTECTOMY    . TONSILECTOMY/ADENOIDECTOMY WITH MYRINGOTOMY    . WISDOM TOOTH EXTRACTION      There were no vitals filed for this visit.   Subjective Assessment - 11/08/20 0836    Subjective Pt states that the mornings are the worst but once she gets up and moving around her pain will be at a 2.    Currently in Pain? Yes    Pain Score 2     Pain Location Knee    Pain Orientation Left;Right    Pain Onset More than a month ago                             [facility] Adult PT Treatment/Exercise - 11/08/20 0001      Exercises   Exercises Knee/Hip      Knee/Hip Exercises: Standing   Heel Raises 15 reps    Knee Flexion Both;15 reps     Knee Flexion Limitations 3#    Terminal Knee Extension Both;10 reps    Hip Abduction Both;1 set;15 reps    Abduction Limitations GTB    Hip Extension Both;15 reps    Extension Limitations GTB    Functional Squat 10 reps    Other Standing Knee Exercises band sidestepping 10 x length of table      Knee/Hip Exercises: Seated   Long Arc Quad Both;2 sets;10 reps;Weights    Long Arc Quad Weight 7 lbs.    Sit to Sand 20 reps   2 x 10 with table elevated     Manual Therapy   Manual Therapy Taping    Manual therapy comments seperate from all other activities    Kinesiotex Create Space      Kinesiotix   Create Space Rt knee                    PT Short Term  Goals - 10/25/20 0846      PT SHORT TERM GOAL #1   Title Patient will report at least 25% improvement in overall symptoms and/or function to demonstrate improved functional mobility    Time 2    Period Weeks    Status On-going    Target Date 09/27/20      PT SHORT TERM GOAL #2   Title Patient will be independent in self management strategies to improve quality of life and functional outcomes.    Time 2    Period Weeks    Status On-going    Target Date 09/27/20             PT Long Term Goals - 10/25/20 0846      PT LONG TERM GOAL #1   Title Patient will report at least 50% improvement in overall symptoms and/or function to demonstrate improved functional mobility    Time 4    Period Weeks    Status On-going      PT LONG TERM GOAL #2   Title Patient will improve on FOTO score to meet predicted outcomes to demonstrate improved functional mobility.    Time 4    Period Weeks    Status On-going      PT LONG TERM GOAL #3   Title Patient will be able to demonstrate painfree left knee ROM in supine.    Time 4    Period Weeks    Status Achieved                 Plan - 11/08/20 5638    Clinical Impression Statement PT demonstrating improved ability to tolerate wb.  Able to increase both reps and wt  today with exercises.  Verbal cue needed to keep core tight throughout exercises.    Personal Factors and Comorbidities Comorbidity 1;Comorbidity 2;Fitness    Comorbidities obese, history of bilateral knee pain    Examination-Activity Limitations Squat;Stairs;Stand;Transfers;Locomotion Level    Examination-Participation Restrictions Occupation;Laundry;Community Activity;Cleaning    Stability/Clinical Decision Making Stable/Uncomplicated    Rehab Potential Fair    PT Frequency 2x / week    PT Duration 8 weeks   additional 4 weeks   PT Treatment/Interventions ADLs/Self Care Home Management;Aquatic Therapy;Electrical Stimulation;Cryotherapy;Moist Heat;Balance training;Therapeutic exercise;Therapeutic activities;Iontophoresis 4mg /ml Dexamethasone;Functional mobility training;Gait training;Stair training;DME Instruction;Neuromuscular re-education;Patient/family education;Manual techniques;Dry needling;Passive range of motion;Joint Manipulations    PT Next Visit Plan Ensure pain in knees has decreased, begin aquatics as able.  Focus on quad strength and MONITOR for hyper-extension - goal to reduce hyperextension in functional activities.    PT Home Exercise Plan stop bridge, SLR WITHOUT hyper extension 12/9 LAQ; 12/16: hamstring set; , SAQ 12/21 mini squat, heel raise, TR, marching, hamstring curls 1/24 band sidestepping, TKE    Consulted and Agree with Plan of Care Patient           Patient will benefit from skilled therapeutic intervention in order to improve the following deficits and impairments:  Abnormal gait,Decreased endurance,Pain,Decreased strength,Decreased knowledge of use of DME,Decreased activity tolerance,Decreased balance,Decreased mobility,Difficulty walking,Improper body mechanics,Decreased range of motion  Visit Diagnosis: Muscle weakness (generalized)  Chronic pain of left knee  Difficulty in walking, not elsewhere classified     Problem List Patient Active Problem List    Diagnosis Date Noted  . Vitamin D deficiency 07/17/2020  . Mixed hyperlipidemia 07/17/2020  . Epidermal cyst of vulva 05/29/2020  . Itching in the vaginal area 05/29/2020  . Gums, bleeding 02/21/2020  . Screening for colorectal cancer 11/29/2019  .  Encounter for gynecological examination with Papanicolaou smear of cervix 11/29/2019  . Morbid obesity (HCC) 11/29/2019  . PMS (premenstrual syndrome) 11/14/2019  . Dysmenorrhea 11/14/2019  . Menorrhagia with regular cycle 11/14/2019  . Breast nodule 01/02/2015  . Morbid obesity with BMI of 60.0-69.9, adult (HCC) 09/21/2014  . Obesity, BMI unknown 05/04/2014  . ASCUS with positive high risk HPV 02/22/2014  . Contraception management 01/06/2013  . Recurrent UTI 01/06/2013  . Hypothyroidism 11/01/2009  . ANXIETY DEPRESSION 11/01/2009  . ALLERGIC ASTHMA 11/01/2009  . HEMATOCHEZIA 11/01/2009  .  LATEX ALLERGY  11/01/2009  . PERSONAL [facility] OTH THAN MEDICINAL AGTS 11/01/2009    Virgina Organ, PT CLT [phone] 11/08/2020, 9:25 AM  [facility] [address] [place], [place], [address] Phone: [phone]   Fax:  [phone]  Name: [name] MRN: [id number] Date of Birth: 02/09/1976

## 2020-11-12 ENCOUNTER — Ambulatory Visit (HOSPITAL_COMMUNITY): Payer: BC Managed Care – PPO | Admitting: Physical Therapy

## 2020-11-12 ENCOUNTER — Telehealth (HOSPITAL_COMMUNITY): Payer: Self-pay | Admitting: Physical Therapy

## 2020-11-12 NOTE — Telephone Encounter (Signed)
pt cancelled appt for today because she can not leave work

## 2020-11-15 ENCOUNTER — Ambulatory Visit (HOSPITAL_COMMUNITY): Payer: BC Managed Care – PPO | Attending: Orthopaedic Surgery | Admitting: Physical Therapy

## 2020-11-15 ENCOUNTER — Encounter (HOSPITAL_COMMUNITY): Payer: Self-pay | Admitting: Physical Therapy

## 2020-11-15 ENCOUNTER — Other Ambulatory Visit: Payer: Self-pay

## 2020-11-15 DIAGNOSIS — R262 Difficulty in walking, not elsewhere classified: Secondary | ICD-10-CM

## 2020-11-15 DIAGNOSIS — M25562 Pain in left knee: Secondary | ICD-10-CM | POA: Insufficient documentation

## 2020-11-15 DIAGNOSIS — G8929 Other chronic pain: Secondary | ICD-10-CM | POA: Insufficient documentation

## 2020-11-15 DIAGNOSIS — M6281 Muscle weakness (generalized): Secondary | ICD-10-CM | POA: Insufficient documentation

## 2020-11-15 NOTE — Therapy (Signed)
[facility] [address] [place], [place], [address] Phone: [phone]   Fax:  [phone]  Physical Therapy Treatment  Patient Details  Name: [name] MRN: [id number] Date of Birth: [birth year] Referring Provider (PT): Darreld Mclean  Progress Note Reporting Period 10/11/20 to 11/15/20  See note below for Objective Data and Assessment of Progress/Goals.       Encounter Date: 11/15/2020   PT End of Session - 11/15/20 0919    Visit Number 15    Number of Visits 23    Date for PT Re-Evaluation 12/28/20   2 week hold per scheduling conflict will resume (12/03/20)   Authorization Type [employer] PPO  - no auth required, VL [id number]    Progress Note Due on Visit 23    PT Start Time 0908    PT Stop Time 0945    PT Time Calculation (min) 37 min    Activity Tolerance Patient tolerated treatment well    Behavior During Therapy WFL for tasks assessed/performed           Past Medical History:  Diagnosis Date  . Allergy   . Anxiety   . Arthritis   . Asthma   . Breast nodule 01/02/2015  . Depression   . Fatigue   . GERD (gastroesophageal reflux disease)   . Herpes   . HPV in female   . Obesity   . PMDD (premenstrual dysphoric disorder)   . Thyroid disease    hypothryoidism  . Trauma   . Vaginal Pap smear, abnormal   . Vitamin D deficiency     Past Surgical History:  Procedure Laterality Date  . CESAREAN SECTION    . CHOLECYSTECTOMY    . TONSILECTOMY/ADENOIDECTOMY WITH MYRINGOTOMY    . WISDOM TOOTH EXTRACTION      There were no vitals filed for this visit.   Subjective Assessment - 11/15/20 0913    Subjective Patient says her knees are too bad today. She says there are still good days and bad days. She says both of her knees hurt intermittently. She feels she has made progress with therapy so far but not as much as she hoped. Patient reports about 40% improvement since starting therapy.    Pertinent History obese,  depression    Limitations Standing    Currently in Pain? Yes    Pain Score 3     Pain Location Knee    Pain Orientation Right;Left    Pain Descriptors / Indicators Aching    Pain Type Chronic pain    Pain Onset More than a month ago    Pain Frequency Constant    Aggravating Factors  walking, WB, prolonged standing    Pain Relieving Factors heat    Effect of Pain on Daily Activities Limits              [facility] PT Assessment - 11/15/20 0001      Assessment   Medical Diagnosis LT knee pain    Referring Provider (PT) Darreld Mclean    Prior Therapy No      Precautions   Precautions None      Restrictions   Weight Bearing Restrictions No      Balance Screen   Has the patient fallen in the past 6 months No   None since starting therapy     Home Environment   Living Environment Private residence    Living Arrangements Spouse/significant other;Children      Prior Function  Level of Independence Independent    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      Cognition   Overall Cognitive Status Within Functional Limits for tasks assessed      Observation/Other Assessments   Focus on Therapeutic Outcomes (FOTO)  37% function   36% function     AROM   Right Knee Extension 0    Right Knee Flexion 100    Left Knee Extension 0    Left Knee Flexion 100      Strength   Right Knee Flexion 5/5   was 4+   Right Knee Extension 5/5    Left Knee Flexion 5/5   was 3+   Left Knee Extension 4+/5   was 3+                        [facility] Adult PT Treatment/Exercise - 11/15/20 0001      Knee/Hip Exercises: Standing   Heel Raises Both;2 sets;10 reps      Knee/Hip Exercises: Seated   Sit to Sand 15 reps;without UE support   with mat elevated                 PT Education - 11/15/20 0918    Education Details on reassessment findings, progress to therapy goals and POC    Person(s) Educated Patient    Methods Explanation     Comprehension Verbalized understanding            PT Short Term Goals - 11/15/20 0935      PT SHORT TERM GOAL #1   Title Patient will report at least 25% improvement in overall symptoms and/or function to demonstrate improved functional mobility    Baseline Reports 40%    Time 2    Period Weeks    Status Achieved    Target Date 09/27/20      PT SHORT TERM GOAL #2   Title Patient will be independent in self management strategies to improve quality of life and functional outcomes.    Baseline Reports compliance with HEP    Time 2    Period Weeks    Status Achieved    Target Date 09/27/20             PT Long Term Goals - 11/15/20 0936      PT LONG TERM GOAL #1   Title Patient will report at least 50% improvement in overall symptoms and/or function to demonstrate improved functional mobility    Baseline Reports 40%    Time 4    Period Weeks    Status On-going      PT LONG TERM GOAL #2   Title Patient will improve on FOTO score to meet predicted outcomes to demonstrate improved functional mobility.    Baseline See FOTO score    Time 4    Period Weeks    Status On-going      PT LONG TERM GOAL #3   Title Patient will be able to demonstrate painfree left knee ROM in supine.    Baseline Can do this within (0-100 degrees)    Time 4    Period Weeks    Status Achieved                 Plan - 11/15/20 0946    Clinical Impression Statement Patient shows steady progress toward therapy goals. Patient shows significant improvements in strength but continues to be limited with flexibility  restrictions and pain with activity. Patient does show improved pain free knee AROM and improved symmetry in knee flexion. Patient still limited by decreased activity tolerance and knee pain with prolonged WB which continues to negatively impact functional ability. Patient will continue to benefit from skilled therapy services to address remaining deficits to reduce pain and improve  functional mobility. Patient to return for next visit the week 2/21 per scheduling conflicts so she will be able to attend aquatic therapy which will likely be beneficial.    Personal Factors and Comorbidities Comorbidity 1;Comorbidity 2;Fitness    Comorbidities obese, history of bilateral knee pain    Examination-Activity Limitations Squat;Stairs;Stand;Transfers;Locomotion Level    Examination-Participation Restrictions Occupation;Laundry;Community Activity;Cleaning    Stability/Clinical Decision Making Stable/Uncomplicated    Rehab Potential Fair    PT Frequency 2x / week    PT Duration 4 weeks   additional 4 weeks   PT Treatment/Interventions ADLs/Self Care Home Management;Aquatic Therapy;Electrical Stimulation;Cryotherapy;Moist Heat;Balance training;Therapeutic exercise;Therapeutic activities;Iontophoresis 4mg /ml Dexamethasone;Functional mobility training;Gait training;Stair training;DME Instruction;Neuromuscular re-education;Patient/family education;Manual techniques;Dry needling;Passive range of motion;Joint Manipulations    PT Next Visit Plan Ensure pain in knees has decreased, begin aquatics as able.  Focus on quad strength and MONITOR for hyper-extension - goal to reduce hyperextension in functional activities.    PT Home Exercise Plan stop bridge, SLR WITHOUT hyper extension 12/9 LAQ; 12/16: hamstring set; , SAQ 12/21 mini squat, heel raise, TR, marching, hamstring curls 1/24 band sidestepping, TKE    Consulted and Agree with Plan of Care Patient           Patient will benefit from skilled therapeutic intervention in order to improve the following deficits and impairments:  Abnormal gait,Decreased endurance,Pain,Decreased strength,Decreased knowledge of use of DME,Decreased activity tolerance,Decreased balance,Decreased mobility,Difficulty walking,Improper body mechanics,Decreased range of motion  Visit Diagnosis: Muscle weakness (generalized)  Chronic pain of left knee  Difficulty  in walking, not elsewhere classified     Problem List Patient Active Problem List   Diagnosis Date Noted  . Vitamin D deficiency 07/17/2020  . Mixed hyperlipidemia 07/17/2020  . Epidermal cyst of vulva 05/29/2020  . Itching in the vaginal area 05/29/2020  . Gums, bleeding 02/21/2020  . Screening for colorectal cancer 11/29/2019  . Encounter for gynecological examination with Papanicolaou smear of cervix 11/29/2019  . Morbid obesity (HCC) 11/29/2019  . PMS (premenstrual syndrome) 11/14/2019  . Dysmenorrhea 11/14/2019  . Menorrhagia with regular cycle 11/14/2019  . Breast nodule 01/02/2015  . Morbid obesity with BMI of 60.0-69.9, adult (HCC) 09/21/2014  . Obesity, BMI unknown 05/04/2014  . ASCUS with positive high risk HPV 02/22/2014  . Contraception management 01/06/2013  . Recurrent UTI 01/06/2013  . Hypothyroidism 11/01/2009  . ANXIETY DEPRESSION 11/01/2009  . ALLERGIC ASTHMA 11/01/2009  . HEMATOCHEZIA 11/01/2009  . 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 PERSONAL HX OTH ALLERG OTH THAN MEDICINAL AGTS 11/01/2009    9:50 AM, 11/15/20 01/13/21 PT DPT  Physical Therapist with [facility]  [phone]   [facility] [address] [place], [place], [place] Phone: [phone]   Fax:  [phone]  Name: [name] MRN: [name] Date of Birth: May 14, 1976

## 2020-11-21 ENCOUNTER — Telehealth: Payer: Self-pay

## 2020-11-21 NOTE — Telephone Encounter (Signed)
Pt left a voicemail and states she is wanting lab orders added to her orders that are already ordered, requesting a cal back

## 2020-11-21 NOTE — Telephone Encounter (Signed)
Patient is requesting you to add Free T3 and reverse T3 to labs to be drawn, she stated that she has been doing research and would like these added as well, please advise.

## 2020-11-21 NOTE — Telephone Encounter (Signed)
Returned call to patient, no answer and mailbox was full.

## 2020-11-21 NOTE — Telephone Encounter (Signed)
Pt requesting a nurse give her a call. °

## 2020-11-22 ENCOUNTER — Other Ambulatory Visit: Payer: Self-pay

## 2020-11-22 ENCOUNTER — Telehealth (INDEPENDENT_AMBULATORY_CARE_PROVIDER_SITE_OTHER): Payer: BC Managed Care – PPO | Admitting: Psychiatry

## 2020-11-22 DIAGNOSIS — F411 Generalized anxiety disorder: Secondary | ICD-10-CM | POA: Diagnosis not present

## 2020-11-22 DIAGNOSIS — R454 Irritability and anger: Secondary | ICD-10-CM

## 2020-11-22 DIAGNOSIS — F331 Major depressive disorder, recurrent, moderate: Secondary | ICD-10-CM

## 2020-11-22 MED ORDER — ZIPRASIDONE HCL 20 MG PO CAPS: 20.0000 mg | ORAL_CAPSULE | Freq: Two times a day (BID) | ORAL | 0 refills | Status: DC

## 2020-11-22 MED ORDER — ALPRAZOLAM 0.5 MG PO TABS
0.5000 mg | ORAL_TABLET | Freq: Two times a day (BID) | ORAL | 0 refills | Status: DC | PRN
Start: 2020-11-22 — End: 2021-02-07

## 2020-11-22 MED ORDER — VENLAFAXINE HCL ER 75 MG PO CP24: 225.0000 mg | ORAL_CAPSULE | Freq: Every day | ORAL | 0 refills | Status: DC

## 2020-11-22 MED ORDER — BUSPIRONE HCL 10 MG PO TABS: 20.0000 mg | ORAL_TABLET | Freq: Two times a day (BID) | ORAL | 0 refills | Status: DC

## 2020-11-22 NOTE — Progress Notes (Signed)
Virtual Visit via Telephone Note  I connected with [name] on 11/22/20 at 11:00 AM EST by telephone and verified that I am speaking with the correct person using two identifiers.  Location: Patient: home Provider: office   I discussed the limitations, risks, security and privacy concerns of performing an evaluation and management service by telephone and the availability of in person appointments. I also discussed with the patient that there may be a patient responsible charge related to this service. The patient expressed understanding and agreed to proceed.   History of Present Illness: [name] shares that the Geodon has helped a lot. Her depression episodes have significantly decreased and is no longer a daily battle. [name] has only had 2 episodes of depression over the last month. She has noted an improvement in energy and motivation. Her stress tolerance has improved. [name] denies SI/HI. Her sleep remains disturbed as per her usual. The irritability is slowly improving. [name] thinks she is in perimenopausal and that is greatly contributing. She is suffering from ongoing anxiety but has only had to take Xanax 2x since the last visit. She is managing her anxiety otherwise.    Observations/Objective:  General Appearance: unable to assess  Eye Contact:  unable to assess  Speech:  Clear and Coherent and Normal Rate  Volume:  Normal  Mood:  Anxious and Depressed  Affect:  Full Range  Thought Process:  Coherent and Descriptions of Associations: Circumstantial  Orientation:  Full (Time, Place, and Person)  Thought Content:  Logical  Suicidal Thoughts:  No  Homicidal Thoughts:  No  Memory:  Immediate;   Good  Judgement:  Good  Insight:  Good  Psychomotor Activity: unable to assess  Concentration:  Concentration: Good  Recall:  Good  Fund of Knowledge:  Good  Language:  Good  Akathisia:  unable to assess  Handed:  Left  AIMS (if indicated):     Assets:  Communication  Skills Desire for Improvement Financial Resources/Insurance Housing Leisure Time Resilience Social Support Talents/Skills Transportation Vocational/Educational  ADL's:  unable to assess  Cognition:  WNL  Sleep:         Assessment and Plan: 1. GAD (generalized anxiety disorder) - ALPRAZolam (XANAX) 0.5 MG tablet; Take 1 tablet (0.5 mg total) by mouth 2 (two) times daily as needed for anxiety.  Dispense: 60 tablet; Refill: 0 - busPIRone (BUSPAR) 10 MG tablet; Take 2 tablets (20 mg total) by mouth 2 (two) times daily.  Dispense: 360 tablet; Refill: 0 - venlafaxine XR (EFFEXOR-XR) 75 MG 24 hr capsule; Take 3 capsules (225 mg total) by mouth daily with breakfast.  Dispense: 270 capsule; Refill: 0  2. MDD (major depressive disorder), recurrent episode, moderate (HCC) - busPIRone (BUSPAR) 10 MG tablet; Take 2 tablets (20 mg total) by mouth 2 (two) times daily.  Dispense: 360 tablet; Refill: 0 - venlafaxine XR (EFFEXOR-XR) 75 MG 24 hr capsule; Take 3 capsules (225 mg total) by mouth daily with breakfast.  Dispense: 270 capsule; Refill: 0 - ziprasidone (GEODON) 20 MG capsule; Take 1 capsule (20 mg total) by mouth 2 (two) times daily with a meal.  Dispense: 180 capsule; Refill: 0  3. Irritability and anger - ziprasidone (GEODON) 20 MG capsule; Take 1 capsule (20 mg total) by mouth 2 (two) times daily with a meal.  Dispense: 180 capsule; Refill: 0    -order labs and EKG at next visit  Follow Up Instructions: In 2-3 months or sooner if needed   I discussed the assessment  and treatment plan with the patient. The patient was provided an opportunity to ask questions and all were answered. The patient agreed with the plan and demonstrated an understanding of the instructions.   The patient was advised to call back or seek an in-person evaluation if the symptoms worsen or if the condition fails to improve as anticipated.  I provided 21 minutes of non-face-to-face time during this  encounter.   [name], MD

## 2020-11-26 ENCOUNTER — Other Ambulatory Visit: Payer: Self-pay

## 2020-11-26 ENCOUNTER — Ambulatory Visit (INDEPENDENT_AMBULATORY_CARE_PROVIDER_SITE_OTHER): Payer: BC Managed Care – PPO | Admitting: Psychiatry

## 2020-11-26 DIAGNOSIS — F411 Generalized anxiety disorder: Secondary | ICD-10-CM

## 2020-11-26 DIAGNOSIS — F331 Major depressive disorder, recurrent, moderate: Secondary | ICD-10-CM | POA: Diagnosis not present
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
## 2020-11-27 DIAGNOSIS — E039 Hypothyroidism, unspecified: Secondary | ICD-10-CM | POA: Diagnosis not present

## 2020-11-27 NOTE — Telephone Encounter (Signed)
When we treat hypothyroidism , free t3 and reverse t3 do not add any more useful information to make treatment decisions. Those will rather confuse the picture. For follow up TSH and Free T4 are sufficient.

## 2020-11-27 NOTE — Telephone Encounter (Signed)
Spoke with patient, she had labs drawn this morning, advised her to bring her questions about her research to discuss with Dr. Fransico Him at appt.

## 2020-11-27 NOTE — Telephone Encounter (Signed)
Please advise 

## 2020-11-27 NOTE — Telephone Encounter (Signed)
Can you call pt?

## 2020-11-28 LAB — TSH: TSH: 1.46 u[IU]/mL (ref 0.450–4.500)

## 2020-11-28 LAB — T4, FREE: Free T4: 1.72 ng/dL (ref 0.82–1.77)

## 2020-12-04 ENCOUNTER — Encounter (HOSPITAL_COMMUNITY): Payer: Self-pay | Admitting: Physical Therapy

## 2020-12-04 ENCOUNTER — Other Ambulatory Visit: Payer: Self-pay

## 2020-12-04 ENCOUNTER — Encounter: Payer: Self-pay | Admitting: "Endocrinology

## 2020-12-04 ENCOUNTER — Ambulatory Visit (HOSPITAL_COMMUNITY): Payer: BC Managed Care – PPO | Admitting: Physical Therapy

## 2020-12-04 ENCOUNTER — Ambulatory Visit (INDEPENDENT_AMBULATORY_CARE_PROVIDER_SITE_OTHER): Payer: BC Managed Care – PPO | Admitting: "Endocrinology

## 2020-12-04 VITALS — BP 148/78 | HR 84 | Ht 64.0 in

## 2020-12-04 DIAGNOSIS — E039 Hypothyroidism, unspecified: Secondary | ICD-10-CM

## 2020-12-04 DIAGNOSIS — E559 Vitamin D deficiency, unspecified: Secondary | ICD-10-CM | POA: Diagnosis not present

## 2020-12-04 DIAGNOSIS — R262 Difficulty in walking, not elsewhere classified: Secondary | ICD-10-CM

## 2020-12-04 DIAGNOSIS — Z789 Other specified health status: Secondary | ICD-10-CM | POA: Insufficient documentation

## 2020-12-04 DIAGNOSIS — M25562 Pain in left knee: Secondary | ICD-10-CM | POA: Diagnosis not present

## 2020-12-04 DIAGNOSIS — M6281 Muscle weakness (generalized): Secondary | ICD-10-CM

## 2020-12-04 DIAGNOSIS — G8929 Other chronic pain: Secondary | ICD-10-CM

## 2020-12-04 NOTE — Progress Notes (Signed)
12/04/2020, 12:27 PM  Endocrinology follow-up note   [name] is a 45 y.o.-year-old female patient being seen in follow-up for management of hypothyroidism, also interested in discussing weight loss options.   Referred by: Jani Gravel, MD.   Past Medical History:  Diagnosis Date  . Allergy   . Anxiety   . Arthritis   . Asthma   . Breast nodule 01/02/2015  . Depression   . Fatigue   . GERD (gastroesophageal reflux disease)   . Herpes   . HPV in female   . Obesity   . PMDD (premenstrual dysphoric disorder)   . Thyroid disease    hypothryoidism  . Trauma   . Vaginal Pap smear, abnormal   . Vitamin D deficiency     Past Surgical History:  Procedure Laterality Date  . CESAREAN SECTION    . CHOLECYSTECTOMY    . TONSILECTOMY/ADENOIDECTOMY WITH MYRINGOTOMY    . WISDOM TOOTH EXTRACTION      Social History   Socioeconomic History  . Marital status: Married    Spouse name: Not on file  . Number of children: 2  . Years of education: Not on file  . Highest education level: Associate degree: occupational, Hotel manager, or vocational program  Occupational History  . Not on file  Tobacco Use  . Smoking status: Former Research scientist (life sciences)  . Smokeless tobacco: Never Used  . Tobacco comment: light smoker as a teenager   Vaping Use  . Vaping Use: Never used  Substance and Sexual Activity  . Alcohol use: Yes    Comment: occ  . Drug use: No  . Sexual activity: Yes    Birth control/protection: Pill  Other Topics Concern  . Not on file  Social History Narrative   Social Hx:   Current living situation- living in [place] with husband, son is 50/50 custody with biological father, daughter lives with pt's mom in [place]. Pt lives close to her mom   Raised in [place] by mom and dad   Siblings- 2 brothers and pt is the youngest. She is 37 yrs younger than her youngest  brother   Schooling- associates degree in early childhood   Married- yes - currently in 75rd marriage   Kids- 2 from 1st marriage      Legal issues- denies   Social Determinants of Radio broadcast assistant Strain: Not on file  Food Insecurity: Not on file  Transportation Needs: Not on file  Physical Activity: Not on file  Stress: Not on file  Social Connections: Not on file    Family History  Problem Relation Age of Onset  . Hypertension Mother   . Arthritis Mother   . Anxiety disorder Mother   . Asthma Father   . COPD Father   . Arthritis Father   . Chronic bronchitis Father   . Hypertension Father   . Hyperlipidemia  Father   . Anxiety disorder Father   . Depression Father   . Alcohol abuse Father   . Drug abuse Brother   . Alcohol abuse Brother   . Hypertension Brother   . Mental illness Brother   . Depression Brother   . Cancer Maternal Aunt   . Cancer Maternal Uncle   . Cancer Paternal Aunt   . Cancer Paternal Uncle   . Asthma Maternal Grandmother   . Arthritis Maternal Grandmother   . Hypertension Maternal Grandmother   . Congestive Heart Failure Maternal Grandmother   . Diabetes Maternal Grandfather   . Stroke Maternal Grandfather   . ADD / ADHD Son   . Hyperlipidemia Brother   . Hearing loss Brother   . Asthma Brother   . Learning disabilities Brother     Outpatient Encounter Medications as of 12/04/2020  Medication Sig  . ALPRAZolam (XANAX) 0.5 MG tablet Take 1 tablet (0.5 mg total) by mouth 2 (two) times daily as needed for anxiety.  Marland Kitchen augmented betamethasone dipropionate (DIPROLENE-AF) 0.05 % cream   . busPIRone (BUSPAR) 10 MG tablet Take 2 tablets (20 mg total) by mouth 2 (two) times daily.  . calcium-vitamin D (OSCAL WITH D) 500-200 MG-UNIT tablet Take 1 tablet by mouth.  . cetirizine (ZYRTEC) 10 MG tablet Take 10 mg by mouth daily.  Marland Kitchen ibuprofen (ADVIL) 600 MG tablet Take 600 mg by mouth 3 (three) times daily. As needed  . levalbuterol  (XOPENEX HFA) 45 MCG/ACT inhaler Inhale into the lungs every 4 (four) hours as needed for wheezing.  Marland Kitchen levothyroxine (SYNTHROID) 200 MCG tablet Take 1 tablet (200 mcg total) by mouth daily before breakfast.  . levothyroxine (SYNTHROID) 75 MCG tablet Take 1 tablet (75 mcg total) by mouth daily.  Marland Kitchen MELATONIN PO Take by mouth at bedtime.  . Misc Natural Products (CRANBERRY/PROBIOTIC PO) Take by mouth daily. Cranberry/Probiotic/Prebiotic Blend-2 daily  . montelukast (SINGULAIR) 10 MG tablet Take 10 mg by mouth daily.   . Multiple Vitamins-Minerals (WOMENS MULTI VITAMIN & MINERAL PO) Take by mouth daily.  . norethindrone (AYGESTIN) 5 MG tablet TAKE 2 TABLETS BY MOUTH DAILY  . venlafaxine XR (EFFEXOR-XR) 75 MG 24 hr capsule Take 3 capsules (225 mg total) by mouth daily with breakfast.  . ziprasidone (GEODON) 20 MG capsule Take 1 capsule (20 mg total) by mouth 2 (two) times daily with a meal.   No facility-administered encounter medications on file as of 12/04/2020.    ALLERGIES: Allergies  Allergen Reactions  . Latex   . Naproxen   . Shrimp [Shellfish Allergy] Other (See Comments)    Intolerance    VACCINATION STATUS: Immunization History  Administered Date(s) Administered  . Moderna Sars-Covid-2 Vaccination 12/12/2019, 01/11/2020     HPI    [name]  is a patient with the above medical history. she was diagnosed  with hypothyroidism at approximate age of 75 years which required treatment with levothyroxine.  Etiology of her hypothyroidism was recently confirmed to be Hashimoto's thyroiditis. -She is currently on levothyroxine 275 mcg p.o. daily before breakfast.  She reports compliance with medication. Her previsit thyroid function tests are consistent with appropriate replacement. -Her major concern remains to be inability to lose weight.  She chose not to be weighed today.  See notes from previous visits.     She also has chronic complaints of fatigue, depression, mood  swings/disorder.  She is currently on mood stabilizers, antidepressants.  Patient with prior history of eating disorders.  she has family history of  thyroid disorders in her uncle who has an identified thyroid dysfunction.  No family history of thyroid cancer.  No history of  radiation therapy to head or neck. No recent use of iodine supplements.  She is on polypharmacy including psychotropics: BuSpar 15 mg twice daily, Depakote 500 mg p.o. daily, Effexor 225 mg p.o. daily.   Reportedly, she underwent sleep studies which did not reveal sleep apnea.   ROS: Limited as above.   Physical Exam: BP (!) 148/78   Pulse 84   Ht 5\' 4"  (1.626 m)   BMI 85.82 kg/m  Wt Readings from Last 3 Encounters:  09/18/20 (!) 500 lb (226.8 kg)  07/31/20 (!) 500 lb 6.4 oz (227 kg)  07/17/20 (!) 498 lb (225.9 kg)     CMP ( most recent) CMP     Component Value Date/Time   NA 137 11/14/2009 1255   K 3.7 11/14/2009 1255   CL 102 11/14/2009 1255   CO2 29 11/14/2009 1255   GLUCOSE 89 11/14/2009 1255   BUN 5 (L) 11/14/2009 1255   CREATININE 0.71 11/14/2009 1255   CALCIUM 9.2 11/14/2009 1255   GFRNONAA >60 11/14/2009 1255   GFRAA  11/14/2009 1255    >60        The eGFR has been calculated using the MDRD equation. This calculation has not been validated in all clinical situations. eGFR's persistently <60 mL/min signify possible Chronic Kidney Disease.   Recent Results (from the past 2160 hour(s))  TSH     Status: None   Collection Time: 11/27/20  9:00 AM  Result Value Ref Range   TSH 1.460 0.450 - 4.500 uIU/mL  T4, free     Status: None   Collection Time: 11/27/20  9:00 AM  Result Value Ref Range   Free T4 1.72 0.82 - 1.77 ng/dL    Thyroid ultrasound on July 26, 2020: Right lobe 4.8 cm with no nodules. Left lobe 4.5 cm with no nodules.   ASSESSMENT: 1. Hypothyroidism 2.  Morbid obesity 3.  Vitamin D  Deficiency 4.  Hypertension  PLAN:  The etiology of her hypothyroidism  is confirmed to be Hashimoto's thyroiditis.  Her ultrasound is unremarkable, will not need any biopsy or surgery at this time. -Her previsit thyroid function tests are consistent with appropriate replacement.  She is advised to continue levothyroxine 275 mcg p.o. daily before breakfast.     - We discussed about the correct intake of her thyroid hormone, on empty stomach at fasting, with water, separated by at least 30 minutes from breakfast and other medications,  and separated by more than 4 hours from calcium, iron, multivitamins, acid reflux medications (PPIs). -Patient is made aware of the fact that thyroid hormone replacement is needed for life, dose to be adjusted by periodic monitoring of thyroid function tests.   Vitamin D deficiency: She is advised to continue her vitamin D supplements with vitamin D3 5000 units daily.  Is also on Os-Cal 500-200 mg.  Daily.  Reguarding  her morbid obesity: Her a.m. cortisol is normal. This is the most significant health risk for her.  Her weight gain seems to be driven by excessive caloric intake worsened by slowed metabolism likely due to perimenopausal syndrome.   She will still benefit from dietary management to lose weight, and she was interested to explore her options.  She is not a suitable candidate for pharmacologic agents for weight management.  However, patient is a good candidate for  surgical weight management.  She was hesitant during her last visit, more open this visit.  I gave her information brochure and contact numbers to initiate the process of  weight loss surgery at [facility] bariatric program.  - she acknowledges that there is a room for improvement in her food and drink choices. - Suggestion is made for her to avoid simple carbohydrates  from her diet including Cakes, Sweet Desserts, Ice Cream, Soda (diet and regular), Sweet Tea, Candies, Chips, Cookies, Store Bought Juices, Alcohol in Excess of  1-2 drinks a day, Artificial  Sweeteners,  Coffee Creamer, and "Sugar-free" Products, Lemonade. This will help patient to have more stable blood glucose profile and potentially avoid unintended weight gain.  She is advised to maintain close follow-up with her PMD.     - Time spent on this patient care encounter:  30 minutes of which 50% was spent in  counseling and the rest reviewing  her current and  previous labs / studies and medications  doses and developing a plan for long term care, and documenting this care. [name]  participated in the discussions, expressed understanding, and voiced agreement with the above plans.  All questions were answered to her satisfaction. she is encouraged to contact clinic should she have any questions or concerns prior to her return visit.    Return in about 6 months (around 06/03/2021) for F/U with Pre-visit Labs.  Glade Lloyd, MD [facility] [address] [place],  [address] Phone: [phone]  Fax: [phone]   12/04/2020, 12:26 PM  This note was partially dictated with voice recognition software. Similar sounding words can be transcribed inadequately or may not  be corrected upon review.

## 2020-12-04 NOTE — Progress Notes (Signed)
Pt would like to discuss weight loss medication options.

## 2020-12-04 NOTE — Therapy (Signed)
[facility] [address] [place], [place], [address] Phone: [phone]   Fax:  [phone]  Physical Therapy Treatment  Patient Details  Name: [name] MRN: [id number] Date of Birth: 1976/01/08 Referring Provider (PT): Darreld Mclean   Encounter Date: 12/04/2020   PT End of Session - 12/04/20 1041    Visit Number 16    Number of Visits 23    Date for PT Re-Evaluation 12/28/20   2 week hold per scheduling conflict will resume (12/03/20)   Authorization Type BCBS Comm PPO  - no auth required, VL [id number]    Progress Note Due on Visit 23    PT Start Time 1035    PT Stop Time 1113    PT Time Calculation (min) 38 min    Activity Tolerance Patient tolerated treatment well;Patient limited by fatigue    Behavior During Therapy [facility] for tasks assessed/performed           Past Medical History:  Diagnosis Date  . Allergy   . Anxiety   . Arthritis   . Asthma   . Breast nodule 01/02/2015  . Depression   . Fatigue   . GERD (gastroesophageal reflux disease)   . Herpes   . HPV in female   . Obesity   . PMDD (premenstrual dysphoric disorder)   . Thyroid disease    hypothryoidism  . Trauma   . Vaginal Pap smear, abnormal   . Vitamin D deficiency     Past Surgical History:  Procedure Laterality Date  . CESAREAN SECTION    . CHOLECYSTECTOMY    . TONSILECTOMY/ADENOIDECTOMY WITH MYRINGOTOMY    . WISDOM TOOTH EXTRACTION      There were no vitals filed for this visit.   Subjective Assessment - 12/04/20 1039    Subjective Patient says she has been doing pretty good. Has periods where knee hurts more than others. Doing fine with HEP. No issues.    Pertinent History obese, depression    Limitations Standing    Currently in Pain? Yes    Pain Score 2     Pain Location Knee    Pain Orientation Right;Left    Pain Descriptors / Indicators Aching    Pain Type Chronic pain    Pain Onset More than a month ago    Pain Frequency  Intermittent                             [facility] Adult PT Treatment/Exercise - 12/04/20 0001      Knee/Hip Exercises: Standing   Heel Raises Both;2 sets;10 reps    Terminal Knee Extension Both;15 reps    Theraband Level (Terminal Knee Extension) Level 3 (Green)    Hip Abduction Both;2 sets;10 reps    Abduction Limitations GTB    Hip Extension Both;2 sets;10 reps    Extension Limitations GTB    Forward Step Up Both;2 sets;Hand Hold: 2;Step Height: 4";10 reps    Other Standing Knee Exercises band sidestepping 5 x length of table GTB      Knee/Hip Exercises: Seated   Sit to Sand 2 sets;10 reps;without UE support                    PT Short Term Goals - 11/15/20 0935      PT SHORT TERM GOAL #1   Title Patient will report at least 25% improvement in overall symptoms and/or function to  demonstrate improved functional mobility    Baseline Reports 40%    Time 2    Period Weeks    Status Achieved    Target Date 09/27/20      PT SHORT TERM GOAL #2   Title Patient will be independent in self management strategies to improve quality of life and functional outcomes.    Baseline Reports compliance with HEP    Time 2    Period Weeks    Status Achieved    Target Date 09/27/20             PT Long Term Goals - 11/15/20 0936      PT LONG TERM GOAL #1   Title Patient will report at least 50% improvement in overall symptoms and/or function to demonstrate improved functional mobility    Baseline Reports 40%    Time 4    Period Weeks    Status On-going      PT LONG TERM GOAL #2   Title Patient will improve on FOTO score to meet predicted outcomes to demonstrate improved functional mobility.    Baseline See FOTO score    Time 4    Period Weeks    Status On-going      PT LONG TERM GOAL #3   Title Patient will be able to demonstrate painfree left knee ROM in supine.    Baseline Can do this within (0-100 degrees)    Time 4    Period Weeks    Status  Achieved                 Plan - 12/04/20 1107    Clinical Impression Statement Patient tolerated session well today. She was able to resume all prior ther ex without increased complaint of pain. Progressed LE strengthening with added forward step ups on 4 inch box. Patient cued on proper form and avoiding excessive trunk lean during standing hip abduction and extension. Patient limited by decreased activity tolerance with ther ex and requires frequent rest breaks due to fatigue. Patient will continue to benefit from skilled therapy services to progress strength and activity tolerance for reduce knee pain and improved functional mobility.    Personal Factors and Comorbidities Comorbidity 1;Comorbidity 2;Fitness    Comorbidities obese, history of bilateral knee pain    Examination-Activity Limitations Squat;Stairs;Stand;Transfers;Locomotion Level    Examination-Participation Restrictions Occupation;Laundry;Community Activity;Cleaning    Stability/Clinical Decision Making Stable/Uncomplicated    Rehab Potential Fair    PT Frequency 2x / week    PT Duration 4 weeks   additional 4 weeks   PT Treatment/Interventions ADLs/Self Care Home Management;Aquatic Therapy;Electrical Stimulation;Cryotherapy;Moist Heat;Balance training;Therapeutic exercise;Therapeutic activities;Iontophoresis 4mg /ml Dexamethasone;Functional mobility training;Gait training;Stair training;DME Instruction;Neuromuscular re-education;Patient/family education;Manual techniques;Dry needling;Passive range of motion;Joint Manipulations    PT Next Visit Plan Focus on quad strength and MONITOR for hyper-extension - goal to reduce hyperextension in functional activities. Add step down, increase step up height as able    PT Home Exercise Plan stop bridge, SLR WITHOUT hyper extension 12/9 LAQ; 12/16: hamstring set; , SAQ 12/21 mini squat, heel raise, TR, marching, hamstring curls 1/24 band sidestepping, TKE    Consulted and Agree with Plan  of Care Patient           Patient will benefit from skilled therapeutic intervention in order to improve the following deficits and impairments:  Abnormal gait,Decreased endurance,Pain,Decreased strength,Decreased knowledge of use of DME,Decreased activity tolerance,Decreased balance,Decreased mobility,Difficulty walking,Improper body mechanics,Decreased range of motion  Visit Diagnosis: Muscle weakness (generalized)  Chronic  pain of left knee  Difficulty in walking, not elsewhere classified     Problem List Patient Active Problem List   Diagnosis Date Noted  . Vitamin D deficiency 07/17/2020  . Mixed hyperlipidemia 07/17/2020  . Epidermal cyst of vulva 05/29/2020  . Itching in the vaginal area 05/29/2020  . Gums, bleeding 02/21/2020  . Screening for colorectal cancer 11/29/2019  . Encounter for gynecological examination with Papanicolaou smear of cervix 11/29/2019  . Morbid obesity (HCC) 11/29/2019  . PMS (premenstrual syndrome) 11/14/2019  . Dysmenorrhea 11/14/2019  . Menorrhagia with regular cycle 11/14/2019  . Breast nodule 01/02/2015  . Morbid obesity with BMI of 60.0-69.9, adult (HCC) 09/21/2014  . Obesity, BMI unknown 05/04/2014  . ASCUS with positive high risk HPV 02/22/2014  . Contraception management 01/06/2013  . Recurrent UTI 01/06/2013  . Hypothyroidism 11/01/2009  . ANXIETY DEPRESSION 11/01/2009  . ALLERGIC ASTHMA 11/01/2009  . HEMATOCHEZIA 11/01/2009  . 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 PERSONAL HX OTH ALLERG OTH THAN MEDICINAL AGTS 11/01/2009    11:13 AM, 12/04/20 Georges Lynch PT DPT  Physical Therapist with [place]  [facility]  [phone]   [facility] [address] [place], [place], [address] Phone: [phone]   Fax:  [phone]  Name: [name] MRN: [id number] Date of Birth: 12/20/1975

## 2020-12-06 ENCOUNTER — Other Ambulatory Visit: Payer: Self-pay

## 2020-12-06 ENCOUNTER — Ambulatory Visit (HOSPITAL_COMMUNITY): Payer: BC Managed Care – PPO

## 2020-12-06 ENCOUNTER — Encounter (HOSPITAL_COMMUNITY): Payer: Self-pay

## 2020-12-06 DIAGNOSIS — M6281 Muscle weakness (generalized): Secondary | ICD-10-CM | POA: Diagnosis not present

## 2020-12-06 DIAGNOSIS — M25562 Pain in left knee: Secondary | ICD-10-CM | POA: Diagnosis not present

## 2020-12-06 DIAGNOSIS — R262 Difficulty in walking, not elsewhere classified: Secondary | ICD-10-CM | POA: Diagnosis not present

## 2020-12-06 DIAGNOSIS — G8929 Other chronic pain: Secondary | ICD-10-CM | POA: Diagnosis not present

## 2020-12-06 NOTE — Therapy (Signed)
[facility] [address] [place], [place], [address] Phone: [phone]   Fax:  [phone]  Physical Therapy Treatment  Patient Details  Name: [name] MRN: [id number] Date of Birth: 1976-09-10 Referring Provider (PT): Darreld Mclean   Encounter Date: 12/06/2020   PT End of Session - 12/06/20 0837    Visit Number 17    Number of Visits 23    Date for PT Re-Evaluation 12/28/20   2 week hold per scheduling conflict will resume (12/03/20)   Authorization Type [employer] PPO  - no auth required, VL [id number]    Progress Note Due on Visit 23    PT Start Time 0831    PT Stop Time 0913    PT Time Calculation (min) 42 min    Activity Tolerance Patient tolerated treatment well;Patient limited by fatigue    Behavior During Therapy [facility] for tasks assessed/performed           Past Medical History:  Diagnosis Date  . Allergy   . Anxiety   . Arthritis   . Asthma   . Breast nodule 01/02/2015  . Depression   . Fatigue   . GERD (gastroesophageal reflux disease)   . Herpes   . HPV in female   . Obesity   . PMDD (premenstrual dysphoric disorder)   . Thyroid disease    hypothryoidism  . Trauma   . Vaginal Pap smear, abnormal   . Vitamin D deficiency     Past Surgical History:  Procedure Laterality Date  . CESAREAN SECTION    . CHOLECYSTECTOMY    . TONSILECTOMY/ADENOIDECTOMY WITH MYRINGOTOMY    . WISDOM TOOTH EXTRACTION      There were no vitals filed for this visit.   Subjective Assessment - 12/06/20 0835    Subjective Pt stated she is achey and tired today, feels related to the rain.    Pertinent History obese, depression    Currently in Pain? Yes    Pain Score 4     Pain Location Knee    Pain Orientation Right;Left    Pain Descriptors / Indicators Aching    Pain Type Chronic pain    Pain Onset More than a month ago    Pain Frequency Intermittent    Aggravating Factors  walking, WB, prolonged standing    Pain Relieving  Factors heat, Ibuprofen    Effect of Pain on Daily Activities limits                             [facility] Adult PT Treatment/Exercise - 12/06/20 0001      Exercises   Exercises Knee/Hip      Knee/Hip Exercises: Standing   Heel Raises Both;2 sets;10 reps    Hip Abduction Both;2 sets;10 reps    Abduction Limitations GTB    Hip Extension Both;2 sets;10 reps    Extension Limitations GTB    Forward Step Up Both;2 sets;Hand Hold: 2;Step Height: 4";10 reps    Step Down Both;10 reps;Hand Hold: 2;Step Height: 2"    Functional Squat 2 sets;5 reps    Functional Squat Limitations minisquat wiht cueing for form/mechaincs    Other Standing Knee Exercises band sidestepping 5 x length of table GTB    Other Standing Knee Exercises Hip diagonals BLE GTB      Knee/Hip Exercises: Seated   Sit to Sand 2 sets;10 reps;without UE support  PT Short Term Goals - 11/15/20 0935      PT SHORT TERM GOAL #1   Title Patient will report at least 25% improvement in overall symptoms and/or function to demonstrate improved functional mobility    Baseline Reports 40%    Time 2    Period Weeks    Status Achieved    Target Date 09/27/20      PT SHORT TERM GOAL #2   Title Patient will be independent in self management strategies to improve quality of life and functional outcomes.    Baseline Reports compliance with HEP    Time 2    Period Weeks    Status Achieved    Target Date 09/27/20             PT Long Term Goals - 11/15/20 0936      PT LONG TERM GOAL #1   Title Patient will report at least 50% improvement in overall symptoms and/or function to demonstrate improved functional mobility    Baseline Reports 40%    Time 4    Period Weeks    Status On-going      PT LONG TERM GOAL #2   Title Patient will improve on FOTO score to meet predicted outcomes to demonstrate improved functional mobility.    Baseline See FOTO score    Time 4    Period Weeks     Status On-going      PT LONG TERM GOAL #3   Title Patient will be able to demonstrate painfree left knee ROM in supine.    Baseline Can do this within (0-100 degrees)    Time 4    Period Weeks    Status Achieved                 Plan - 12/06/20 0851    Clinical Impression Statement Session focus on functional strengthening.  Pt easily fatigued this session required frequent seated rest breaks, stated she feels like a hot flash.  Water given and fan running during session to assist with overheating.  Cueing for stability during hip strengthening therex as tendency to lean upper half.  Progressed quad strengthening with additional step downs, noted increased difficulty with Rt quad control.    Personal Factors and Comorbidities Comorbidity 1;Comorbidity 2;Fitness    Comorbidities obese, history of bilateral knee pain    Examination-Activity Limitations Squat;Stairs;Stand;Transfers;Locomotion Level    Examination-Participation Restrictions Occupation;Laundry;Community Activity;Cleaning    Stability/Clinical Decision Making Stable/Uncomplicated    Clinical Decision Making Low    Rehab Potential Fair    PT Frequency 2x / week    PT Duration 4 weeks   additional 4 weeks   PT Treatment/Interventions ADLs/Self Care Home Management;Aquatic Therapy;Electrical Stimulation;Cryotherapy;Moist Heat;Balance training;Therapeutic exercise;Therapeutic activities;Iontophoresis 4mg /ml Dexamethasone;Functional mobility training;Gait training;Stair training;DME Instruction;Neuromuscular re-education;Patient/family education;Manual techniques;Dry needling;Passive range of motion;Joint Manipulations    PT Next Visit Plan Focus on quad strength and MONITOR for hyper-extension - goal to reduce hyperextension in functional activities. Add step down, increase step up height as able    PT Home Exercise Plan stop bridge, SLR WITHOUT hyper extension 12/9 LAQ; 12/16: hamstring set; , SAQ 12/21 mini squat, heel raise,  TR, marching, hamstring curls 1/24 band sidestepping, TKE    Consulted and Agree with Plan of Care Patient           Patient will benefit from skilled therapeutic intervention in order to improve the following deficits and impairments:  Abnormal gait,Decreased endurance,Pain,Decreased strength,Decreased knowledge of use of DME,Decreased activity  tolerance,Decreased balance,Decreased mobility,Difficulty walking,Improper body mechanics,Decreased range of motion  Visit Diagnosis: Muscle weakness (generalized)  Chronic pain of left knee  Difficulty in walking, not elsewhere classified     Problem List Patient Active Problem List   Diagnosis Date Noted  . Educated about management of weight 12/04/2020  . Vitamin D deficiency 07/17/2020  . Mixed hyperlipidemia 07/17/2020  . Epidermal cyst of vulva 05/29/2020  . Itching in the vaginal area 05/29/2020  . Gums, bleeding 02/21/2020  . Screening for colorectal cancer 11/29/2019  . Encounter for gynecological examination with Papanicolaou smear of cervix 11/29/2019  . Morbid obesity (HCC) 11/29/2019  . PMS (premenstrual syndrome) 11/14/2019  . Dysmenorrhea 11/14/2019  . Menorrhagia with regular cycle 11/14/2019  . Breast nodule 01/02/2015  . Morbid obesity with BMI of 60.0-69.9, adult (HCC) 09/21/2014  . Obesity, BMI unknown 05/04/2014  . ASCUS with positive high risk HPV 02/22/2014  . Contraception management 01/06/2013  . Recurrent UTI 01/06/2013  . Hypothyroidism 11/01/2009  . ANXIETY DEPRESSION 11/01/2009  . ALLERGIC ASTHMA 11/01/2009  . HEMATOCHEZIA 11/01/2009  . LATEX ALLERGY  11/01/2009  . PERSONAL HX OTH ALLERG OTH THAN MEDICINAL AGTS 11/01/2009   Becky Sax, LPTA/CLT; CBIS [phone]  Juel Burrow 12/06/2020, 9:23 AM  [facility] [address] [place], [place], [address] Phone: [phone]   Fax:  [phone]  Name: [name] MRN: [id number] Date of  Birth: Mar 08, 1976

## 2020-12-10 ENCOUNTER — Other Ambulatory Visit: Payer: Self-pay

## 2020-12-10 ENCOUNTER — Ambulatory Visit (INDEPENDENT_AMBULATORY_CARE_PROVIDER_SITE_OTHER): Payer: BC Managed Care – PPO | Admitting: Psychiatry

## 2020-12-10 ENCOUNTER — Ambulatory Visit (HOSPITAL_COMMUNITY): Payer: BC Managed Care – PPO | Admitting: Physical Therapy

## 2020-12-10 DIAGNOSIS — F411 Generalized anxiety disorder: Secondary | ICD-10-CM | POA: Diagnosis not present

## 2020-12-10 DIAGNOSIS — M7918 Myalgia, other site: Secondary | ICD-10-CM | POA: Diagnosis not present

## 2020-12-10 DIAGNOSIS — R059 Cough, unspecified: Secondary | ICD-10-CM | POA: Diagnosis not present

## 2020-12-10 DIAGNOSIS — R5383 Other fatigue: Secondary | ICD-10-CM | POA: Diagnosis not present

## 2020-12-10 DIAGNOSIS — Z1159 Encounter for screening for other viral diseases: Secondary | ICD-10-CM | POA: Diagnosis not present

## 2020-12-10 DIAGNOSIS — F331 Major depressive disorder, recurrent, moderate: Secondary | ICD-10-CM | POA: Diagnosis not present
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
## 2020-12-13 ENCOUNTER — Telehealth (HOSPITAL_COMMUNITY): Payer: Self-pay | Admitting: Physical Therapy

## 2020-12-13 ENCOUNTER — Ambulatory Visit (HOSPITAL_COMMUNITY): Payer: BC Managed Care – PPO | Admitting: Physical Therapy

## 2020-12-13 NOTE — Telephone Encounter (Signed)
pt called to cx this appt due to she is not feeling well 

## 2020-12-17 ENCOUNTER — Other Ambulatory Visit: Payer: Self-pay

## 2020-12-17 ENCOUNTER — Encounter (HOSPITAL_COMMUNITY): Payer: Self-pay | Admitting: Physical Therapy

## 2020-12-17 ENCOUNTER — Ambulatory Visit (HOSPITAL_COMMUNITY): Payer: BC Managed Care – PPO | Attending: Orthopaedic Surgery | Admitting: Physical Therapy

## 2020-12-17 DIAGNOSIS — R262 Difficulty in walking, not elsewhere classified: Secondary | ICD-10-CM | POA: Insufficient documentation

## 2020-12-17 DIAGNOSIS — M6281 Muscle weakness (generalized): Secondary | ICD-10-CM | POA: Insufficient documentation

## 2020-12-17 DIAGNOSIS — M25562 Pain in left knee: Secondary | ICD-10-CM | POA: Insufficient documentation

## 2020-12-17 DIAGNOSIS — G8929 Other chronic pain: Secondary | ICD-10-CM

## 2020-12-17 NOTE — Therapy (Signed)
[facility] [address] [place], [place], [address] Phone: [phone]   Fax:  [phone]  Physical Therapy Treatment  Patient Details  Name: [name] MRN: [id number] Date of Birth: 06-11-1976 Referring Provider (PT): Darreld Mclean   Encounter Date: 12/17/2020   PT End of Session - 12/17/20 1743    Visit Number 18    Number of Visits 23    Date for PT Re-Evaluation 12/28/20   2 week hold per scheduling conflict will resume (12/03/20)   Authorization Type [employer] Comm PPO  - no auth required, VL 30    Progress Note Due on Visit 23    PT Start Time 1558    PT Stop Time 1655    PT Time Calculation (min) 57 min    Activity Tolerance Patient tolerated treatment well    Behavior During Therapy [facility] for tasks assessed/performed           Past Medical History:  Diagnosis Date  . Allergy   . Anxiety   . Arthritis   . Asthma   . Breast nodule 01/02/2015  . Depression   . Fatigue   . GERD (gastroesophageal reflux disease)   . Herpes   . HPV in female   . Obesity   . PMDD (premenstrual dysphoric disorder)   . Thyroid disease    hypothryoidism  . Trauma   . Vaginal Pap smear, abnormal   . Vitamin D deficiency     Past Surgical History:  Procedure Laterality Date  . CESAREAN SECTION    . CHOLECYSTECTOMY    . TONSILECTOMY/ADENOIDECTOMY WITH MYRINGOTOMY    . WISDOM TOOTH EXTRACTION      There were no vitals filed for this visit.   Subjective Assessment - 12/17/20 1741    Subjective Patient says she feels her knees are at baseline. She couldn't make it last week because she was sick with a sinus infection.    Pertinent History obese, depression    Currently in Pain? Yes    Pain Score 2     Pain Location Knee    Pain Orientation Right;Left    Pain Descriptors / Indicators Aching    Pain Type Chronic pain    Pain Onset More than a month ago    Pain Frequency Intermittent                         Adult  Aquatic Therapy - 12/17/20 1749      Treatment   Exercises Dynamic warmup: pool walking, sidestepping, retro walking 4 RT each; heel raise 3 x10, mini squat 3 x 10, standing hip abduction 3 x 10, calf stretch 3 x 20", lunge stretch on step 10 x 5", knee flexion 3 x 10                        PT Short Term Goals - 11/15/20 0935      PT SHORT TERM GOAL #1   Title Patient will report at least 25% improvement in overall symptoms and/or function to demonstrate improved functional mobility    Baseline Reports 40%    Time 2    Period Weeks    Status Achieved    Target Date 09/27/20      PT SHORT TERM GOAL #2   Title Patient will be independent in self management strategies to improve quality of life and functional outcomes.    Baseline Reports  compliance with HEP    Time 2    Period Weeks    Status Achieved    Target Date 09/27/20             PT Long Term Goals - 11/15/20 0936      PT LONG TERM GOAL #1   Title Patient will report at least 50% improvement in overall symptoms and/or function to demonstrate improved functional mobility    Baseline Reports 40%    Time 4    Period Weeks    Status On-going      PT LONG TERM GOAL #2   Title Patient will improve on FOTO score to meet predicted outcomes to demonstrate improved functional mobility.    Baseline See FOTO score    Time 4    Period Weeks    Status On-going      PT LONG TERM GOAL #3   Title Patient will be able to demonstrate painfree left knee ROM in supine.    Baseline Can do this within (0-100 degrees)    Time 4    Period Weeks    Status Achieved                 Plan - 12/17/20 1744    Clinical Impression Statement Patient tolerated session well today and was able to tolerate much more activity in pool than on land. Patient preformed knee strengthening and stretching progressions with decreased pain compared to land therapy. Patient educated on purpose and function of all added exercises  today. Patient will continue to benefit from therapy progressions to improve knee strength and stability to reduce pain and improve functional mobility.    Personal Factors and Comorbidities Comorbidity 1;Comorbidity 2;Fitness    Comorbidities obese, history of bilateral knee pain    Examination-Activity Limitations Squat;Stairs;Stand;Transfers;Locomotion Level    Examination-Participation Restrictions Occupation;Laundry;Community Activity;Cleaning    Stability/Clinical Decision Making Stable/Uncomplicated    Rehab Potential Fair    PT Frequency 2x / week    PT Duration 4 weeks   additional 4 weeks   PT Treatment/Interventions ADLs/Self Care Home Management;Aquatic Therapy;Electrical Stimulation;Cryotherapy;Moist Heat;Balance training;Therapeutic exercise;Therapeutic activities;Iontophoresis 4mg /ml Dexamethasone;Functional mobility training;Gait training;Stair training;DME Instruction;Neuromuscular re-education;Patient/family education;Manual techniques;Dry needling;Passive range of motion;Joint Manipulations    PT Next Visit Plan Focus on quad strength and MONITOR for hyper-extension - goal to reduce hyperextension in functional activities. Add step down, increase step up height as able    PT Home Exercise Plan stop bridge, SLR WITHOUT hyper extension 12/9 LAQ; 12/16: hamstring set; , SAQ 12/21 mini squat, heel raise, TR, marching, hamstring curls 1/24 band sidestepping, TKE    Consulted and Agree with Plan of Care Patient           Patient will benefit from skilled therapeutic intervention in order to improve the following deficits and impairments:  Abnormal gait,Decreased endurance,Pain,Decreased strength,Decreased knowledge of use of DME,Decreased activity tolerance,Decreased balance,Decreased mobility,Difficulty walking,Improper body mechanics,Decreased range of motion  Visit Diagnosis: Muscle weakness (generalized)  Chronic pain of left knee  Difficulty in walking, not elsewhere  classified     Problem List Patient Active Problem List   Diagnosis Date Noted  . Educated about management of weight 12/04/2020  . Vitamin D deficiency 07/17/2020  . Mixed hyperlipidemia 07/17/2020  . Epidermal cyst of vulva 05/29/2020  . Itching in the vaginal area 05/29/2020  . Gums, bleeding 02/21/2020  . Screening for colorectal cancer 11/29/2019  . Encounter for gynecological examination with Papanicolaou smear of cervix 11/29/2019  . Morbid obesity (  HCC) 11/29/2019  . PMS (premenstrual syndrome) 11/14/2019  . Dysmenorrhea 11/14/2019  . Menorrhagia with regular cycle 11/14/2019  . Breast nodule 01/02/2015  . Morbid obesity with BMI of 60.0-69.9, adult (HCC) 09/21/2014  . Obesity, BMI unknown 05/04/2014  . ASCUS with positive high risk HPV 02/22/2014  . Contraception management 01/06/2013  . Recurrent UTI 01/06/2013  . Hypothyroidism 11/01/2009  . ANXIETY DEPRESSION 11/01/2009  . ALLERGIC ASTHMA 11/01/2009  . HEMATOCHEZIA 11/01/2009  .  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 PERSONAL HX OTH ALLERG OTH THAN MEDICINAL AGTS 11/01/2009    5:52 PM, 12/17/20 Georges Lynch PT DPT  Physical Therapist with   [facility]  [phone]   [facility] [address] [place], [place], [address] Phone: [phone]   Fax:  [phone]  Name: GWENDLOYN FORSEE MRN: [id number] Date of Birth: 12-25-1975

## 2020-12-20 ENCOUNTER — Other Ambulatory Visit: Payer: Self-pay

## 2020-12-20 ENCOUNTER — Ambulatory Visit (HOSPITAL_COMMUNITY): Payer: BC Managed Care – PPO | Admitting: Physical Therapy

## 2020-12-20 ENCOUNTER — Encounter (HOSPITAL_COMMUNITY): Payer: Self-pay | Admitting: Physical Therapy

## 2020-12-20 DIAGNOSIS — M6281 Muscle weakness (generalized): Secondary | ICD-10-CM | POA: Diagnosis not present

## 2020-12-20 DIAGNOSIS — G8929 Other chronic pain: Secondary | ICD-10-CM | POA: Diagnosis not present

## 2020-12-20 DIAGNOSIS — R262 Difficulty in walking, not elsewhere classified: Secondary | ICD-10-CM

## 2020-12-20 DIAGNOSIS — M25562 Pain in left knee: Secondary | ICD-10-CM | POA: Diagnosis not present

## 2020-12-20 NOTE — Therapy (Signed)
[facility] [address] [place], [place], [address] Phone: [phone]   Fax:  [phone]  Physical Therapy Treatment  Patient Details  Name: [name] MRN: [id number] Date of Birth: February 01, 1976 Referring Provider (PT): Darreld Mclean   Encounter Date: 12/20/2020   PT End of Session - 12/20/20 0911    Visit Number 19    Number of Visits 23    Date for PT Re-Evaluation 12/28/20   2 week hold per scheduling conflict will resume (12/03/20)   Authorization Type [employer] PPO  - no auth required, VL [id number]    Progress Note Due on Visit 23    PT Start Time 0905    PT Stop Time 0947    PT Time Calculation (min) 42 min    Activity Tolerance Patient tolerated treatment well    Behavior During Therapy [facility] for tasks assessed/performed           Past Medical History:  Diagnosis Date  . Allergy   . Anxiety   . Arthritis   . Asthma   . Breast nodule 01/02/2015  . Depression   . Fatigue   . GERD (gastroesophageal reflux disease)   . Herpes   . HPV in female   . Obesity   . PMDD (premenstrual dysphoric disorder)   . Thyroid disease    hypothryoidism  . Trauma   . Vaginal Pap smear, abnormal   . Vitamin D deficiency     Past Surgical History:  Procedure Laterality Date  . CESAREAN SECTION    . CHOLECYSTECTOMY    . TONSILECTOMY/ADENOIDECTOMY WITH MYRINGOTOMY    . WISDOM TOOTH EXTRACTION      There were no vitals filed for this visit.   Subjective Assessment - 12/20/20 0909    Subjective Rt knee is a little more sore this morning. Really liked the pool, no pain, just muscle soreness.    Pertinent History obese, depression    Currently in Pain? Yes    Pain Score 3     Pain Location Knee    Pain Orientation Right    Pain Descriptors / Indicators Aching    Pain Type Chronic pain    Pain Onset More than a month ago    Pain Frequency Intermittent                             [facility] Adult PT  Treatment/Exercise - 12/20/20 0001      Knee/Hip Exercises: Aerobic   Nustep 4 min lv 1 EOS      Knee/Hip Exercises: Standing   Heel Raises Both;2 sets;10 reps    Knee Flexion Both;2 sets;10 reps;Limitations    Knee Flexion Limitations 2#    Hip Abduction Both;2 sets;10 reps    Abduction Limitations 2#    Hip Extension Both;2 sets;10 reps    Extension Limitations 2#    Forward Step Up Both;2 sets;10 reps;Hand Hold: 2;Step Height: 4"    Gait Training 226 feet with quad cane for conditioning and mobility      Knee/Hip Exercises: Seated   Sit to Sand 1 set;10 reps;without UE support                    PT Short Term Goals - 11/15/20 0935      PT SHORT TERM GOAL #1   Title Patient will report at least 25% improvement in overall symptoms and/or function to demonstrate  improved functional mobility    Baseline Reports 40%    Time 2    Period Weeks    Status Achieved    Target Date 09/27/20      PT SHORT TERM GOAL #2   Title Patient will be independent in self management strategies to improve quality of life and functional outcomes.    Baseline Reports compliance with HEP    Time 2    Period Weeks    Status Achieved    Target Date 09/27/20             PT Long Term Goals - 11/15/20 0936      PT LONG TERM GOAL #1   Title Patient will report at least 50% improvement in overall symptoms and/or function to demonstrate improved functional mobility    Baseline Reports 40%    Time 4    Period Weeks    Status On-going      PT LONG TERM GOAL #2   Title Patient will improve on FOTO score to meet predicted outcomes to demonstrate improved functional mobility.    Baseline See FOTO score    Time 4    Period Weeks    Status On-going      PT LONG TERM GOAL #3   Title Patient will be able to demonstrate painfree left knee ROM in supine.    Baseline Can do this within (0-100 degrees)    Time 4    Period Weeks    Status Achieved                 Plan -  12/20/20 0945    Clinical Impression Statement Patient tolerated session well overall. She notes slight discomfort in RT knee post session, but was able to perform all activity with no increased complaint of pain. Added weight knee flexion for hamstring strength and added nustep at end of session for mobility and conditioning. Patient will continue to benefit from skilled therapy services to progress strength and mobility to reduce knee pain and improve functional mobility.    Personal Factors and Comorbidities Comorbidity 1;Comorbidity 2;Fitness    Comorbidities obese, history of bilateral knee pain    Examination-Activity Limitations Squat;Stairs;Stand;Transfers;Locomotion Level    Examination-Participation Restrictions Occupation;Laundry;Community Activity;Cleaning    Stability/Clinical Decision Making Stable/Uncomplicated    Rehab Potential Fair    PT Frequency 2x / week    PT Duration 4 weeks   additional 4 weeks   PT Treatment/Interventions ADLs/Self Care Home Management;Aquatic Therapy;Electrical Stimulation;Cryotherapy;Moist Heat;Balance training;Therapeutic exercise;Therapeutic activities;Iontophoresis 4mg /ml Dexamethasone;Functional mobility training;Gait training;Stair training;DME Instruction;Neuromuscular re-education;Patient/family education;Manual techniques;Dry needling;Passive range of motion;Joint Manipulations    PT Next Visit Plan Continue to progress strength and continitoning as tolerated. Increase step height, add static balance    PT Home Exercise Plan stop bridge, SLR WITHOUT hyper extension 12/9 LAQ; 12/16: hamstring set; , SAQ 12/21 mini squat, heel raise, TR, marching, hamstring curls 1/24 band sidestepping, TKE    Consulted and Agree with Plan of Care Patient           Patient will benefit from skilled therapeutic intervention in order to improve the following deficits and impairments:  Abnormal gait,Decreased endurance,Pain,Decreased strength,Decreased knowledge of  use of DME,Decreased activity tolerance,Decreased balance,Decreased mobility,Difficulty walking,Improper body mechanics,Decreased range of motion  Visit Diagnosis: Muscle weakness (generalized)  Chronic pain of left knee  Difficulty in walking, not elsewhere classified     Problem List Patient Active Problem List   Diagnosis Date Noted  . Educated about management of  weight 12/04/2020  . Vitamin D deficiency 07/17/2020  . Mixed hyperlipidemia 07/17/2020  . Epidermal cyst of vulva 05/29/2020  . Itching in the vaginal area 05/29/2020  . Gums, bleeding 02/21/2020  . Screening for colorectal cancer 11/29/2019  . Encounter for gynecological examination with Papanicolaou smear of cervix 11/29/2019  . Morbid obesity (HCC) 11/29/2019  . PMS (premenstrual syndrome) 11/14/2019  . Dysmenorrhea 11/14/2019  . Menorrhagia with regular cycle 11/14/2019  . Breast nodule 01/02/2015  . Morbid obesity with BMI of 60.0-69.9, adult (HCC) 09/21/2014  . Obesity, BMI unknown 05/04/2014  . ASCUS with positive high risk HPV 02/22/2014  . Contraception management 01/06/2013  . Recurrent UTI 01/06/2013  . Hypothyroidism 11/01/2009  . ANXIETY DEPRESSION 11/01/2009  . ALLERGIC ASTHMA 11/01/2009  . HEMATOCHEZIA 11/01/2009  . 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 PERSONAL HX OTH ALLERG OTH THAN MEDICINAL AGTS 11/01/2009    9:53 AM, 12/20/20 Georges Lynch PT DPT  Physical Therapist with   [facility]  [phone]   [facility] [address] [place], [place], [address] Phone: [phone]   Fax:  [phone]  Name: [name] MRN: [id number] Date of Birth: 20-Oct-1975

## 2020-12-24 ENCOUNTER — Ambulatory Visit (HOSPITAL_COMMUNITY): Payer: BC Managed Care – PPO | Admitting: Physical Therapy

## 2020-12-24 ENCOUNTER — Encounter (HOSPITAL_COMMUNITY): Payer: Self-pay | Admitting: Physical Therapy

## 2020-12-24 ENCOUNTER — Ambulatory Visit (INDEPENDENT_AMBULATORY_CARE_PROVIDER_SITE_OTHER): Payer: BC Managed Care – PPO | Admitting: Psychiatry

## 2020-12-24 ENCOUNTER — Other Ambulatory Visit: Payer: Self-pay

## 2020-12-24 DIAGNOSIS — M6281 Muscle weakness (generalized): Secondary | ICD-10-CM | POA: Diagnosis not present

## 2020-12-24 DIAGNOSIS — F331 Major depressive disorder, recurrent, moderate: Secondary | ICD-10-CM

## 2020-12-24 DIAGNOSIS — G8929 Other chronic pain: Secondary | ICD-10-CM | POA: Diagnosis not present

## 2020-12-24 DIAGNOSIS — R262 Difficulty in walking, not elsewhere classified: Secondary | ICD-10-CM | POA: Diagnosis not present

## 2020-12-24 DIAGNOSIS — F411 Generalized anxiety disorder: Secondary | ICD-10-CM

## 2020-12-24 DIAGNOSIS — M25562 Pain in left knee: Secondary | ICD-10-CM | POA: Diagnosis not present
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
## 2020-12-24 NOTE — Therapy (Signed)
[facility] [address] [place], [place], [address] Phone: [phone]   Fax:  [phone]  Physical Therapy Treatment  Patient Details  Name: [name] MRN: [id number] Date of Birth: 1975-11-25 Referring Provider (PT): Darreld Mclean   Encounter Date: 12/24/2020   PT End of Session - 12/24/20 1750    Visit Number 20    Number of Visits 23    Date for PT Re-Evaluation 12/28/20   2 week hold per scheduling conflict will resume (12/03/20)   Authorization Type [employer] Comm PPO  - no auth required, VL [id number]    Progress Note Due on Visit 23    PT Start Time 1604    PT Stop Time 1655    PT Time Calculation (min) 51 min    Activity Tolerance Patient tolerated treatment well    Behavior During Therapy WFL for tasks assessed/performed           Past Medical History:  Diagnosis Date  . Allergy   . Anxiety   . Arthritis   . Asthma   . Breast nodule 01/02/2015  . Depression   . Fatigue   . GERD (gastroesophageal reflux disease)   . Herpes   . HPV in female   . Obesity   . PMDD (premenstrual dysphoric disorder)   . Thyroid disease    hypothryoidism  . Trauma   . Vaginal Pap smear, abnormal   . Vitamin D deficiency     Past Surgical History:  Procedure Laterality Date  . CESAREAN SECTION    . CHOLECYSTECTOMY    . TONSILECTOMY/ADENOIDECTOMY WITH MYRINGOTOMY    . WISDOM TOOTH EXTRACTION      There were no vitals filed for this visit.   Subjective Assessment - 12/24/20 1748    Subjective Patient says she is having some sinus problems and didn't sleep well. her RT knee is hurting a little today.    Pertinent History obese, depression    Currently in Pain? Yes    Pain Score 3     Pain Location Knee    Pain Orientation Right    Pain Descriptors / Indicators Aching    Pain Type Chronic pain    Pain Onset More than a month ago    Pain Frequency Intermittent                         Adult Aquatic Therapy -  12/24/20 1757      Treatment   Exercises Dynamic warmup: pool walking, sidestepping, retro walking 4 RT each; heel raise 3 x10, mini squat 3 x 10, standing hip abduction 3 x 10, lunge stretch on step 10 x 5", knee flexion 3 x 10, mini lunge 2 x 10 each                        PT Short Term Goals - 11/15/20 0935      PT SHORT TERM GOAL #1   Title Patient will report at least 25% improvement in overall symptoms and/or function to demonstrate improved functional mobility    Baseline Reports 40%    Time 2    Period Weeks    Status Achieved    Target Date 09/27/20      PT SHORT TERM GOAL #2   Title Patient will be independent in self management strategies to improve quality of life and functional outcomes.    Baseline Reports compliance with  HEP    Time 2    Period Weeks    Status Achieved    Target Date 09/27/20             PT Long Term Goals - 11/15/20 0936      PT LONG TERM GOAL #1   Title Patient will report at least 50% improvement in overall symptoms and/or function to demonstrate improved functional mobility    Baseline Reports 40%    Time 4    Period Weeks    Status On-going      PT LONG TERM GOAL #2   Title Patient will improve on FOTO score to meet predicted outcomes to demonstrate improved functional mobility.    Baseline See FOTO score    Time 4    Period Weeks    Status On-going      PT LONG TERM GOAL #3   Title Patient will be able to demonstrate painfree left knee ROM in supine.    Baseline Can do this within (0-100 degrees)    Time 4    Period Weeks    Status Achieved                 Plan - 12/24/20 1751    Clinical Impression Statement Patient tolerated session well today. Good tolerance in gravity minimized setting. Patient able to progress reps and progressed LE strength exercise. Patient was well challenged with added mini lunges. Patient required verbal cues and demo for proper form. Patient notes slight discomfort with  excess reps but reduced with rest. Patient report no increased pain end of session. Patient will continue to benefit from skilled therapy services to progress knee strength and stabilization for reduced pain and improved functional mobility.    Personal Factors and Comorbidities Comorbidity 1;Comorbidity 2;Fitness    Comorbidities obese, history of bilateral knee pain    Examination-Activity Limitations Squat;Stairs;Stand;Transfers;Locomotion Level    Examination-Participation Restrictions Occupation;Laundry;Community Activity;Cleaning    Stability/Clinical Decision Making Stable/Uncomplicated    Rehab Potential Fair    PT Frequency 2x / week    PT Duration 4 weeks   additional 4 weeks   PT Treatment/Interventions ADLs/Self Care Home Management;Aquatic Therapy;Electrical Stimulation;Cryotherapy;Moist Heat;Balance training;Therapeutic exercise;Therapeutic activities;Iontophoresis 4mg /ml Dexamethasone;Functional mobility training;Gait training;Stair training;DME Instruction;Neuromuscular re-education;Patient/family education;Manual techniques;Dry needling;Passive range of motion;Joint Manipulations    PT Next Visit Plan Reassess next visit. Adjust POC as indicated    PT Home Exercise Plan stop bridge, SLR WITHOUT hyper extension 12/9 LAQ; 12/16: hamstring set; , SAQ 12/21 mini squat, heel raise, TR, marching, hamstring curls 1/24 band sidestepping, TKE    Consulted and Agree with Plan of Care Patient           Patient will benefit from skilled therapeutic intervention in order to improve the following deficits and impairments:  Abnormal gait,Decreased endurance,Pain,Decreased strength,Decreased knowledge of use of DME,Decreased activity tolerance,Decreased balance,Decreased mobility,Difficulty walking,Improper body mechanics,Decreased range of motion  Visit Diagnosis: Muscle weakness (generalized)  Chronic pain of left knee  Difficulty in walking, not elsewhere classified     Problem  List Patient Active Problem List   Diagnosis Date Noted  . Educated about management of weight 12/04/2020  . Vitamin D deficiency 07/17/2020  . Mixed hyperlipidemia 07/17/2020  . Epidermal cyst of vulva 05/29/2020  . Itching in the vaginal area 05/29/2020  . Gums, bleeding 02/21/2020  . Screening for colorectal cancer 11/29/2019  . Encounter for gynecological examination with Papanicolaou smear of cervix 11/29/2019  . Morbid obesity (HCC) 11/29/2019  . PMS (  premenstrual syndrome) 11/14/2019  . Dysmenorrhea 11/14/2019  . Menorrhagia with regular cycle 11/14/2019  . Breast nodule 01/02/2015  . Morbid obesity with BMI of 60.0-69.9, adult (HCC) 09/21/2014  . Obesity, BMI unknown 05/04/2014  . ASCUS with positive high risk HPV 02/22/2014  . Contraception management 01/06/2013  . Recurrent UTI 01/06/2013  . Hypothyroidism 11/01/2009  . ANXIETY DEPRESSION 11/01/2009  . ALLERGIC ASTHMA 11/01/2009  . HEMATOCHEZIA 11/01/2009  .  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 PERSONAL HX OTH ALLERG OTH THAN MEDICINAL AGTS 11/01/2009    6:00 PM, 12/24/20 Georges Lynch PT DPT  Physical Therapist with [facility]  [facility]  [phone]   [facility] [address] [place], [place], [address] Phone: [phone]   Fax:  [phone]  Name: ASMAA TIRPAK MRN: [id number] Date of Birth: 1975-11-05

## 2020-12-27 ENCOUNTER — Ambulatory Visit (HOSPITAL_COMMUNITY): Payer: BC Managed Care – PPO | Admitting: Physical Therapy

## 2020-12-27 ENCOUNTER — Encounter (HOSPITAL_COMMUNITY): Payer: Self-pay | Admitting: Physical Therapy

## 2020-12-27 ENCOUNTER — Other Ambulatory Visit: Payer: Self-pay

## 2020-12-27 DIAGNOSIS — G8929 Other chronic pain: Secondary | ICD-10-CM | POA: Diagnosis not present

## 2020-12-27 DIAGNOSIS — M6281 Muscle weakness (generalized): Secondary | ICD-10-CM

## 2020-12-27 DIAGNOSIS — M25562 Pain in left knee: Secondary | ICD-10-CM

## 2020-12-27 DIAGNOSIS — R262 Difficulty in walking, not elsewhere classified: Secondary | ICD-10-CM | POA: Diagnosis not present

## 2020-12-27 NOTE — Therapy (Signed)
[facility] [address] [place], [place], [address] Phone: [phone]   Fax:  [phone]  Physical Therapy Treatment  Patient Details  Name: [name] MRN: [id number] Date of Birth: 02/13/1976 Referring Provider (PT): [place]  Visits from Start of Care: 21  Current functional level related to goals / functional outcomes: See below    Remaining deficits: See below    Education / Equipment: See assessment   Plan: Patient agrees to discharge.  Patient goals were met. Patient is being discharged due to meeting the stated rehab goals.  ?????       Encounter Date: 12/27/2020   PT End of Session - 12/27/20 0912    Visit Number 21    Number of Visits 23    Date for PT Re-Evaluation 12/28/20   2 week hold per scheduling conflict will resume (9/56/38)   Authorization Type [employer] PPO  - no auth required, VL [id number]    Progress Note Due on Visit 23    PT Start Time 0902    PT Stop Time 0942    PT Time Calculation (min) 40 min    Activity Tolerance Patient tolerated treatment well    Behavior During Therapy [facility] for tasks assessed/performed           Past Medical History:  Diagnosis Date  . Allergy   . Anxiety   . Arthritis   . Asthma   . Breast nodule 01/02/2015  . Depression   . Fatigue   . GERD (gastroesophageal reflux disease)   . Herpes   . HPV in female   . Obesity   . PMDD (premenstrual dysphoric disorder)   . Thyroid disease    hypothryoidism  . Trauma   . Vaginal Pap smear, abnormal   . Vitamin D deficiency     Past Surgical History:  Procedure Laterality Date  . CESAREAN SECTION    . CHOLECYSTECTOMY    . TONSILECTOMY/ADENOIDECTOMY WITH MYRINGOTOMY    . WISDOM TOOTH EXTRACTION      There were no vitals filed for this visit.   Subjective Assessment - 12/27/20 0910    Subjective Patient says her knees are ok this morning. She notes she has some blurred  vision in one eye this morning. She notes this is not normal for her. Recommended she speak with her PCP for follow up ASAP.    Pertinent History obese, depression    Currently in Pain? Yes    Pain Score 2     Pain Location Knee    Pain Orientation Right;Left    Pain Descriptors / Indicators Aching    Pain Type Chronic pain    Pain Onset More than a month ago    Pain Frequency Constant    Aggravating Factors  Walking, standing    Pain Relieving Factors Rest, non WB    Effect of Pain on Daily Activities Limits              [facility] PT Assessment - 12/27/20 0001      Assessment   Medical Diagnosis LT knee pain    Referring Provider (PT) Sanjuana Kava      Precautions   Precautions None      Restrictions   Weight Bearing Restrictions No      Balance Screen   Has the patient fallen in the past 6 months No      Home Environment   Living Environment  Private residence    Living Arrangements Spouse/significant other;Children      Prior Function   Level of Independence Independent    Vocation Full time employment    Vocation Requirements preschool teacher      Observation/Other Assessments   Focus on Therapeutic Outcomes (FOTO)  52% function   was 37%     AROM   Right Knee Extension 0    Right Knee Flexion 100    Left Knee Extension 0    Left Knee Flexion 100      Strength   Right Knee Flexion 5/5    Right Knee Extension 4+/5   slight discomfort   Left Knee Flexion 5/5    Left Knee Extension 5/5                                   PT Short Term Goals - 11/15/20 0935      PT SHORT TERM GOAL #1   Title Patient will report at least 25% improvement in overall symptoms and/or function to demonstrate improved functional mobility    Baseline Reports 40%    Time 2    Period Weeks    Status Achieved    Target Date 09/27/20      PT SHORT TERM GOAL #2   Title Patient will be independent in self management strategies to improve quality of life and  functional outcomes.    Baseline Reports compliance with HEP    Time 2    Period Weeks    Status Achieved    Target Date 09/27/20             PT Long Term Goals - 12/27/20 2330      PT LONG TERM GOAL #1   Title Patient will report at least 50% improvement in overall symptoms and/or function to demonstrate improved functional mobility    Baseline Reports 75%    Time 4    Period Weeks    Status Achieved      PT LONG TERM GOAL #2   Title Patient will improve on FOTO score to meet predicted outcomes to demonstrate improved functional mobility.    Baseline Current 52%    Time 4    Period Weeks    Status Achieved      PT LONG TERM GOAL #3   Title Patient will be able to demonstrate painfree left knee ROM in supine.    Baseline Can do this within (0-100 degrees)    Time 4    Period Weeks    Status Achieved                 Plan - 12/27/20 0936    Clinical Impression Statement Patient has made good progress and currently with all goals met/ partially met. Patient still using cane as needed for ambulation for extra stability, but has not had any issues with instability of falls in recent weeks. She states the cane is something of a comfort, especially when she is out and about but does not use it as much at home and shorter distance. Discussed transition to HEP and exercise in pool for continued strength and conditioning. Issued HEP handout and voucher to [employer]. Answered all patient questions. Patient encouraged to follow up with therapy services with any further questions or concerns.    Personal Factors and Comorbidities Comorbidity 1;Comorbidity 2;Fitness    Comorbidities obese, history of bilateral  knee pain    Examination-Activity Limitations Squat;Stairs;Stand;Transfers;Locomotion Level    Examination-Participation Restrictions Occupation;Laundry;Community Activity;Cleaning    Stability/Clinical Decision Making Stable/Uncomplicated    Rehab Potential  Fair    PT Frequency 2x / week    PT Duration 4 weeks   additional 4 weeks   PT Treatment/Interventions ADLs/Self Care Home Management;Aquatic Therapy;Electrical Stimulation;Cryotherapy;Moist Heat;Balance training;Therapeutic exercise;Therapeutic activities;Iontophoresis 4mg /ml Dexamethasone;Functional mobility training;Gait training;Stair training;DME Instruction;Neuromuscular re-education;Patient/family education;Manual techniques;Dry needling;Passive range of motion;Joint Manipulations    PT Next Visit Plan DC to HEP    PT Home Exercise Plan stop bridge, SLR WITHOUT hyper extension 12/9 LAQ; 12/16: hamstring set; , SAQ 12/21 mini squat, heel raise, TR, marching, hamstring curls 1/24 band sidestepping, TKE    Consulted and Agree with Plan of Care Patient           Patient will benefit from skilled therapeutic intervention in order to improve the following deficits and impairments:  Abnormal gait,Decreased endurance,Pain,Decreased strength,Decreased knowledge of use of DME,Decreased activity tolerance,Decreased balance,Decreased mobility,Difficulty walking,Improper body mechanics,Decreased range of motion  Visit Diagnosis: Muscle weakness (generalized)  Chronic pain of left knee  Difficulty in walking, not elsewhere classified     Problem List Patient Active Problem List   Diagnosis Date Noted  . Educated about management of weight 12/04/2020  . Vitamin D deficiency 07/17/2020  . Mixed hyperlipidemia 07/17/2020  . Epidermal cyst of vulva 05/29/2020  . Itching in the vaginal area 05/29/2020  . Gums, bleeding 02/21/2020  . Screening for colorectal cancer 11/29/2019  . Encounter for gynecological examination with Papanicolaou smear of cervix 11/29/2019  . Morbid obesity ([place]) 11/29/2019  . PMS (premenstrual syndrome) 11/14/2019  . Dysmenorrhea 11/14/2019  . Menorrhagia with regular cycle 11/14/2019  . Breast nodule 01/02/2015  . Morbid obesity with BMI of 60.0-69.9, adult ([place])  09/21/2014  . Obesity, BMI unknown 05/04/2014  . ASCUS with positive high risk HPV 02/22/2014  . Contraception management 01/06/2013  . Recurrent UTI 01/06/2013  . Hypothyroidism 11/01/2009  . ANXIETY DEPRESSION 11/01/2009  . ALLERGIC ASTHMA 11/01/2009  . HEMATOCHEZIA 11/01/2009  .  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 PERSONAL HX OTH ALLERG OTH THAN MEDICINAL AGTS 11/01/2009    9:48 AM, 12/27/20 Josue Hector PT DPT  Physical Therapist with [facility]  ([phone] [place] [address] [place], [place], [address] Phone: [phone]   Fax:  [phone]  Name: [name] MRN: [id number] Date of Birth: 1976-04-12

## 2020-12-27 NOTE — Patient Instructions (Signed)
Access Code: [email or web address] URL: [email or web address] Date: 12/27/2020 Prepared by: Georges Lynch  Exercises Heel rises with counter support - 1 x daily - 3 x weekly - 2 sets - 10 reps Standing Hip Abduction with Counter Support - 1 x daily - 3 x weekly - 2 sets - 10 reps Standing Hip Extension with Counter Support - 1 x daily - 3 x weekly - 2 sets - 10 reps Standing Knee Flexion with Counter Support - 1 x daily - 3 x weekly - 2 sets - 10 reps Side Stepping with Counter Support - 1 x daily - 3 x weekly - 1 sets - 10 reps Standing Terminal Knee Extension with Resistance - 1 x daily - 3 x weekly - 2 sets - 10 reps Mini Squat with Counter Support - 1 x daily - 3 x weekly - 2 sets - 10 reps

## 2021-01-07 ENCOUNTER — Other Ambulatory Visit: Payer: Self-pay

## 2021-01-07 ENCOUNTER — Ambulatory Visit (INDEPENDENT_AMBULATORY_CARE_PROVIDER_SITE_OTHER): Payer: BC Managed Care – PPO | Admitting: Psychiatry

## 2021-01-07 DIAGNOSIS — F331 Major depressive disorder, recurrent, moderate: Secondary | ICD-10-CM | POA: Diagnosis not present

## 2021-01-07 DIAGNOSIS — F411 Generalized anxiety disorder: Secondary | ICD-10-CM
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
## 2021-01-09 DIAGNOSIS — N921 Excessive and frequent menstruation with irregular cycle: Secondary | ICD-10-CM | POA: Diagnosis not present

## 2021-01-09 DIAGNOSIS — Z6841 Body Mass Index (BMI) 40.0 and over, adult: Secondary | ICD-10-CM | POA: Diagnosis not present

## 2021-01-22 DIAGNOSIS — R9389 Abnormal findings on diagnostic imaging of other specified body structures: Secondary | ICD-10-CM | POA: Diagnosis not present

## 2021-01-22 DIAGNOSIS — N921 Excessive and frequent menstruation with irregular cycle: Secondary | ICD-10-CM | POA: Diagnosis not present

## 2021-01-22 DIAGNOSIS — N920 Excessive and frequent menstruation with regular cycle: Secondary | ICD-10-CM | POA: Diagnosis not present

## 2021-01-25 ENCOUNTER — Other Ambulatory Visit: Payer: Self-pay

## 2021-01-25 ENCOUNTER — Ambulatory Visit (INDEPENDENT_AMBULATORY_CARE_PROVIDER_SITE_OTHER): Payer: BC Managed Care – PPO | Admitting: Psychiatry

## 2021-01-25 DIAGNOSIS — F411 Generalized anxiety disorder: Secondary | ICD-10-CM

## 2021-01-25 DIAGNOSIS — F331 Major depressive disorder, recurrent, moderate: Secondary | ICD-10-CM
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
## 2021-01-29 DIAGNOSIS — N920 Excessive and frequent menstruation with regular cycle: Secondary | ICD-10-CM | POA: Diagnosis not present

## 2021-01-29 DIAGNOSIS — Z6841 Body Mass Index (BMI) 40.0 and over, adult: Secondary | ICD-10-CM | POA: Diagnosis not present

## 2021-02-06 ENCOUNTER — Other Ambulatory Visit: Payer: Self-pay

## 2021-02-06 ENCOUNTER — Ambulatory Visit (INDEPENDENT_AMBULATORY_CARE_PROVIDER_SITE_OTHER): Payer: BC Managed Care – PPO | Admitting: Psychiatry

## 2021-02-06 DIAGNOSIS — F331 Major depressive disorder, recurrent, moderate: Secondary | ICD-10-CM

## 2021-02-06 DIAGNOSIS — F411 Generalized anxiety disorder: Secondary | ICD-10-CM | POA: Diagnosis not present
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
## 2021-02-07 ENCOUNTER — Telehealth (INDEPENDENT_AMBULATORY_CARE_PROVIDER_SITE_OTHER): Payer: BC Managed Care – PPO | Admitting: Psychiatry

## 2021-02-07 ENCOUNTER — Other Ambulatory Visit: Payer: Self-pay

## 2021-02-07 DIAGNOSIS — F411 Generalized anxiety disorder: Secondary | ICD-10-CM

## 2021-02-07 DIAGNOSIS — R454 Irritability and anger: Secondary | ICD-10-CM

## 2021-02-07 DIAGNOSIS — F331 Major depressive disorder, recurrent, moderate: Secondary | ICD-10-CM | POA: Diagnosis not present

## 2021-02-07 MED ORDER — ALPRAZOLAM 0.5 MG PO TABS: 0.5000 mg | ORAL_TABLET | Freq: Two times a day (BID) | ORAL | 1 refills | Status: DC | PRN

## 2021-02-07 MED ORDER — VENLAFAXINE HCL ER 75 MG PO CP24: 225.0000 mg | ORAL_CAPSULE | Freq: Every day | ORAL | 0 refills | Status: DC

## 2021-02-07 MED ORDER — ZIPRASIDONE HCL 20 MG PO CAPS: 20.0000 mg | ORAL_CAPSULE | Freq: Two times a day (BID) | ORAL | 0 refills | Status: DC

## 2021-02-07 MED ORDER — BUSPIRONE HCL 10 MG PO TABS: 20.0000 mg | ORAL_TABLET | Freq: Two times a day (BID) | ORAL | 0 refills | Status: DC

## 2021-02-07 NOTE — Progress Notes (Signed)
Virtual Visit via Telephone Note  I connected with [name] on 02/07/21 at 11:00 AM EDT by telephone and verified that I am speaking with the correct person using two identifiers.  Location: Patient: work Provider: office   I discussed the limitations, risks, security and privacy concerns of performing an evaluation and management service by telephone and the availability of in person appointments. I also discussed with the patient that there may be a patient responsible charge related to this service. The patient expressed understanding and agreed to proceed.   History of Present Illness: [name] states she is doing ok. Her anxiety is manageable. She has some situational anxiety that seems appropriate to the situation. Her depression is "the same". She states that the current stressor is that depression is the appropriate response.  [name] notes that most days she experiences a certain time of the day she is excessively fatigued. Her sleep is unchanged and has never been that restful. She is able to fall asleep. She wakes up 3-4x to go the bathroom. she is able to fall back asleep quickly. Her husband woke her up last night told her she is not breathing well. She is either shuttering with her breathing or snoring loudly. [name] will often wake herself up to turn thru out the night.  She has been checked for sleep apnea twice. Her concentration is good. She feels bad about her herself a lot. This past weekend when she was really depressed and has some passive thoughts of death. She denies SI/HI. [name] feels like she is just surviving day to day. It is going to be this day until some of her stressors resolve themself. She does think the medications are working.    Observations/Objective:  General Appearance: unable to assess  Eye Contact:  unable to assess  Speech:  Clear and Coherent and Normal Rate  Volume:  Normal  Mood:  Anxious and Depressed  Affect:  Congruent  Thought Process:   Coherent and Descriptions of Associations: Circumstantial  Orientation:  Full (Time, Place, and Person)  Thought Content:  Logical  Suicidal Thoughts:  No  Homicidal Thoughts:  No  Memory:  Immediate;   Good  Judgement:  Good  Insight:  Good  Psychomotor Activity: unable to assess  Concentration:  Concentration: Good  Recall:  Good  Fund of Knowledge:  Good  Language:  Good  Akathisia:  unable to assess  Handed:  Right  AIMS (if indicated):     Assets:  Communication Skills Desire for Improvement Financial Resources/Insurance Housing Resilience Social Support Talents/Skills Transportation Vocational/Educational  ADL's:  unable to assess  Cognition:  WNL  Sleep:         Assessment and Plan: Depression screen [facility] 2/9 02/07/2021 11/26/2020 11/29/2019 11/14/2019  Decreased Interest - 1 3 3   Down, Depressed, Hopeless 2 1 3 3   PHQ - 2 Score 2 2 6 6   Altered sleeping 3 0 3 3  Tired, decreased energy 3 2 3 3   Change in appetite 1 1 3 3   Feeling bad or failure about yourself  2 1 3 3   Trouble concentrating 0 0 2 3  Moving slowly or fidgety/restless 0 0 2 3  Suicidal thoughts 1 1 1 1   PHQ-9 Score 12 7 23 25   Difficult doing work/chores Somewhat difficult Somewhat difficult Very difficult Very difficult   Flowsheet Row Video Visit from [facility]/07/2021 in [facility] Counselor from 11[facility]26/2020 in [facility] Office Visit from 11/29/2019  in [facility]  C-SSRS RISK CATEGORY No Risk Low Risk Error: Q3, 4, or 5 should not be populated when Q2 is No     - she feels her symptoms are due to situational stressors and does not want her meds changed at this time.  -she is working with her therapist  1. GAD (generalized anxiety disorder) - ALPRAZolam (XANAX) 0.5 MG tablet; Take 1 tablet (0.5 mg total) by mouth 2 (two) times daily as needed for anxiety.  Dispense: 60 tablet; Refill: 1 - busPIRone  (BUSPAR) 10 MG tablet; Take 2 tablets (20 mg total) by mouth 2 (two) times daily.  Dispense: 360 tablet; Refill: 0 - venlafaxine XR (EFFEXOR-XR) 75 MG 24 hr capsule; Take 3 capsules (225 mg total) by mouth daily with breakfast.  Dispense: 270 capsule; Refill: 0  2. MDD (major depressive disorder), recurrent episode, moderate (HCC) - busPIRone (BUSPAR) 10 MG tablet; Take 2 tablets (20 mg total) by mouth 2 (two) times daily.  Dispense: 360 tablet; Refill: 0 - venlafaxine XR (EFFEXOR-XR) 75 MG 24 hr capsule; Take 3 capsules (225 mg total) by mouth daily with breakfast.  Dispense: 270 capsule; Refill: 0 - ziprasidone (GEODON) 20 MG capsule; Take 1 capsule (20 mg total) by mouth 2 (two) times daily with a meal.  Dispense: 180 capsule; Refill: 0  3. Irritability and anger - ziprasidone (GEODON) 20 MG capsule; Take 1 capsule (20 mg total) by mouth 2 (two) times daily with a meal.  Dispense: 180 capsule; Refill: 0    Follow Up Instructions: In 4-6 weeks or sooner if needed   I discussed the assessment and treatment plan with the patient. The patient was provided an opportunity to ask questions and all were answered. The patient agreed with the plan and demonstrated an understanding of the instructions.   The patient was advised to call back or seek an in-person evaluation if the symptoms worsen or if the condition fails to improve as anticipated.  I provided 19 minutes of non-face-to-face time during this encounter.   [name], MD

## 2021-02-12 ENCOUNTER — Other Ambulatory Visit: Payer: Self-pay | Admitting: "Endocrinology

## 2021-02-18 DIAGNOSIS — R5382 Chronic fatigue, unspecified: Secondary | ICD-10-CM | POA: Diagnosis not present

## 2021-02-18 DIAGNOSIS — G4710 Hypersomnia, unspecified: Secondary | ICD-10-CM | POA: Diagnosis not present

## 2021-02-18 DIAGNOSIS — J302 Other seasonal allergic rhinitis: Secondary | ICD-10-CM | POA: Diagnosis not present

## 2021-02-18 DIAGNOSIS — D692 Other nonthrombocytopenic purpura: Secondary | ICD-10-CM | POA: Diagnosis not present

## 2021-02-20 ENCOUNTER — Ambulatory Visit (INDEPENDENT_AMBULATORY_CARE_PROVIDER_SITE_OTHER): Payer: BC Managed Care – PPO | Admitting: Psychiatry

## 2021-02-20 ENCOUNTER — Other Ambulatory Visit: Payer: Self-pay

## 2021-02-20 DIAGNOSIS — F331 Major depressive disorder, recurrent, moderate: Secondary | ICD-10-CM

## 2021-02-20 DIAGNOSIS — F411 Generalized anxiety disorder: Secondary | ICD-10-CM
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
## 2021-03-12 DIAGNOSIS — Z79899 Other long term (current) drug therapy: Secondary | ICD-10-CM | POA: Diagnosis not present

## 2021-03-12 DIAGNOSIS — R5382 Chronic fatigue, unspecified: Secondary | ICD-10-CM | POA: Diagnosis not present

## 2021-03-14 ENCOUNTER — Telehealth (HOSPITAL_COMMUNITY): Payer: BC Managed Care – PPO | Admitting: Psychiatry

## 2021-03-14 ENCOUNTER — Other Ambulatory Visit: Payer: Self-pay

## 2021-03-14 DIAGNOSIS — E559 Vitamin D deficiency, unspecified: Secondary | ICD-10-CM | POA: Diagnosis not present

## 2021-03-14 DIAGNOSIS — E039 Hypothyroidism, unspecified: Secondary | ICD-10-CM | POA: Diagnosis not present

## 2021-03-14 DIAGNOSIS — Z0001 Encounter for general adult medical examination with abnormal findings: Secondary | ICD-10-CM | POA: Diagnosis not present

## 2021-04-02 ENCOUNTER — Ambulatory Visit (INDEPENDENT_AMBULATORY_CARE_PROVIDER_SITE_OTHER): Payer: BC Managed Care – PPO | Admitting: Psychiatry

## 2021-04-02 ENCOUNTER — Other Ambulatory Visit: Payer: Self-pay

## 2021-04-02 ENCOUNTER — Other Ambulatory Visit: Payer: Self-pay | Admitting: "Endocrinology

## 2021-04-02 DIAGNOSIS — F331 Major depressive disorder, recurrent, moderate: Secondary | ICD-10-CM

## 2021-04-02 DIAGNOSIS — F411 Generalized anxiety disorder: Secondary | ICD-10-CM
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
## 2021-04-03 DIAGNOSIS — Z20822 Contact with and (suspected) exposure to covid-19: Secondary | ICD-10-CM | POA: Diagnosis not present

## 2021-04-04 ENCOUNTER — Telehealth (INDEPENDENT_AMBULATORY_CARE_PROVIDER_SITE_OTHER): Payer: BC Managed Care – PPO | Admitting: Psychiatry

## 2021-04-04 ENCOUNTER — Telehealth (HOSPITAL_COMMUNITY): Payer: Self-pay | Admitting: Licensed Clinical Social Worker

## 2021-04-04 ENCOUNTER — Other Ambulatory Visit: Payer: Self-pay

## 2021-04-04 DIAGNOSIS — F411 Generalized anxiety disorder: Secondary | ICD-10-CM

## 2021-04-04 DIAGNOSIS — R454 Irritability and anger: Secondary | ICD-10-CM | POA: Diagnosis not present

## 2021-04-04 DIAGNOSIS — F331 Major depressive disorder, recurrent, moderate: Secondary | ICD-10-CM | POA: Diagnosis not present

## 2021-04-04 MED ORDER — ZIPRASIDONE HCL 20 MG PO CAPS: ORAL_CAPSULE | ORAL | 0 refills | Status: DC

## 2021-04-04 MED ORDER — BUSPIRONE HCL 10 MG PO TABS: 20.0000 mg | ORAL_TABLET | Freq: Two times a day (BID) | ORAL | 0 refills | Status: DC

## 2021-04-04 MED ORDER — ALPRAZOLAM 0.5 MG PO TABS: 0.5000 mg | ORAL_TABLET | Freq: Two times a day (BID) | ORAL | 0 refills | Status: DC | PRN

## 2021-04-04 MED ORDER — VENLAFAXINE HCL ER 75 MG PO CP24: 225.0000 mg | ORAL_CAPSULE | Freq: Every day | ORAL | 0 refills | Status: DC

## 2021-04-04 NOTE — Progress Notes (Signed)
Virtual Visit via Telephone Note  I connected with [name] on 04/04/21 at  8:30 AM EDT by telephone and verified that I am speaking with the correct person using two identifiers. She is unable to log onto her mychart account. She will speak with the office to get it fixed.  Location: Patient: home Provider: office   I discussed the limitations, risks, security and privacy concerns of performing an evaluation and management service by telephone and the availability of in person appointments. I also discussed with the patient that there may be a patient responsible charge related to this service. The patient expressed understanding and agreed to proceed.   History of Present Illness: [name] states she is doing well. She is extremely overwhelming. Her anxiety and depression have worsened. [name] is also very irritable. She tried to talk to her PCP 2 weeks ago but her PCP didn't understand. [name] is getting a sleep apnea study because she falls asleep when she is very stressed. On days she is off this does not happen. Going to work and being at work cause her anxiety to sky rocket. Thinking about going to work causes the anxiety goes up. It causes stomach aches and nausea. At work she experiences all this and has difficulty focusing. Her boss does not understand Erum's mental health and [name] feels it is a hostile work environment. She has missed at least 2 days of work recently due to the anxiety. [name] wants to take to take medical leave due to her mental health. [name] feels that the anxiety is causing is not allowing her to work or function properly. [name] cries because she is no longer a good job. She doesn't know what she wants to do about her job. She hasn't really thought about what she wants to do about a job in general. On further discussion she wants to find a job that understands about mental health disability. Over the last 2 weeks ago she is has been depressed everyday. She  falling and staying asleep but still wakes up tired. She is endorsing anhedonia, guilt and psychomotor retardation. Her appetite is sometimes up and other times down. She has a lot of negative self thoughts. She feels that everyone would be better if she was dead. She denies SI/HI. [name] meets with her therapist every 2 weeks but it doesn't seem to be helping. She is not able to implement the coping skills when she is at work. On days she is not working the anxiety is more manageable but the depression is constant. She takes the Xanax at home but not at work because it makes her sleepy. [name] works M-F.    Observations/Objective:  General Appearance: unable to assess  Eye Contact:  unable to assess  Speech:  Clear and Coherent and Normal Rate  Volume:  Normal  Mood:  Anxious and Depressed  Affect:  Congruent, Depressed, and Tearful  Thought Process:  Coherent and Descriptions of Associations: Circumstantial  Orientation:  Full (Time, Place, and Person)  Thought Content:  Rumination  Suicidal Thoughts:  No  Homicidal Thoughts:  No  Memory:  Immediate;   Good  Judgement:  Intact  Insight:  Present  Psychomotor Activity: unable to assess  Concentration:  Concentration: Good  Recall:  Good  Fund of Knowledge:  Good  Language:  Good  Akathisia:  unable to assess  Handed:  Right  AIMS (if indicated):     Assets:  Communication Skills Desire for Improvement Financial Resources/Insurance Housing Curator  Vocational/Educational  ADL's:  unable to assess  Cognition:  WNL  Sleep:          Assessment and Plan: Depression screen [facility] 2/9 04/04/2021 02/07/2021 11/26/2020 11/29/2019 11/14/2019  Decreased Interest 3 - 1 3 3   Down, Depressed, Hopeless 3 2 1 3 3   PHQ - 2 Score 6 2 2 6 6   Altered sleeping 0 3 0 3 3  Tired, decreased energy 3 3 2 3 3   Change in appetite 3 1 1 3 3   Feeling bad or failure about yourself  3 2 1 3 3   Trouble concentrating 3 0 0 2 3  Moving  slowly or fidgety/restless 2 0 0 2 3  Suicidal thoughts 3 1 1 1 1   PHQ-9 Score 23 12 7 23 25   Difficult doing work/chores Extremely dIfficult Somewhat difficult Somewhat difficult Very difficult Very difficult    Flowsheet Row Video Visit from 04/04/2021 in [facility] Video Visit from 02/07/2021 in [facility] Counselor from 11/26/2020 in [facility]  C-SSRS RISK CATEGORY Error: Q3, 4, or 5 should not be populated when Q2 is No No Risk Low Risk      Referred for IOP for overwhelming anxiety and depression  1. GAD (generalized anxiety disorder) - ALPRAZolam (XANAX) 0.5 MG tablet; Take 1 tablet (0.5 mg total) by mouth 2 (two) times daily as needed for anxiety.  Dispense: 60 tablet; Refill: 0 - busPIRone (BUSPAR) 10 MG tablet; Take 2 tablets (20 mg total) by mouth 2 (two) times daily.  Dispense: 360 tablet; Refill: 0 - venlafaxine XR (EFFEXOR-XR) 75 MG 24 hr capsule; Take 3 capsules (225 mg total) by mouth daily with breakfast.  Dispense: 270 capsule; Refill: 0  2. MDD (major depressive disorder), recurrent episode, moderate (HCC) - busPIRone (BUSPAR) 10 MG tablet; Take 2 tablets (20 mg total) by mouth 2 (two) times daily.  Dispense: 360 tablet; Refill: 0 - venlafaxine XR (EFFEXOR-XR) 75 MG 24 hr capsule; Take 3 capsules (225 mg total) by mouth daily with breakfast.  Dispense: 270 capsule; Refill: 0 - ziprasidone (GEODON) 20 MG capsule; Take 1 capsule (20 mg total) by mouth daily with breakfast AND 2 capsules (40 mg total) every evening.  Dispense: 270 capsule; Refill: 0  3. Irritability and anger - ziprasidone (GEODON) 20 MG capsule; Take 1 capsule (20 mg total) by mouth daily with breakfast AND 2 capsules (40 mg total) every evening.  Dispense: 270 capsule; Refill: 0  I have written her out of work from 04/04/21- 04/15/2021. I strongly urged her to consider her future plans  regarding employment. She verbalized understanding.   Follow Up Instructions: In 2 weeks or sooner if needed   I discussed the assessment and treatment plan with the patient. The patient was provided an opportunity to ask questions and all were answered. The patient agreed with the plan and demonstrated an understanding of the instructions.   The patient was advised to call back or seek an in-person evaluation if the symptoms worsen or if the condition fails to improve as anticipated.  I provided 34 minutes of non-face-to-face time during this encounter.   , MD

## 2021-04-09 ENCOUNTER — Telehealth (HOSPITAL_COMMUNITY): Payer: Self-pay

## 2021-04-09 NOTE — Telephone Encounter (Signed)
[employer] OF  PRESCRIPTION COVERAGE APPROVED  ZIPRASIDONE 20MG  CAPSULE REFERENCE # [id number] EFFECTIVE 04/08/2021 TO 04/07/2022  S/W CARLA NOTIFIED PHARMACY. PT SET UP FOR TEXT MESSAGING SO WILL BE NOTIFIED WHEN MEDICATION IS READY FOR PICK UP.  PD. CLAIM FOR 90 DAY SUPPLY FOR OUT OF POCKET TO PATIENT OF $102.37 PHARMACY STATED THAT THEY CAN ALSO DO A 30 DAY SUPPLY IF PATIENT PREFERS

## 2021-04-10 ENCOUNTER — Ambulatory Visit (HOSPITAL_COMMUNITY): Payer: BC Managed Care – PPO | Admitting: Licensed Clinical Social Worker

## 2021-04-10 ENCOUNTER — Encounter (HOSPITAL_COMMUNITY): Payer: Self-pay | Admitting: Licensed Clinical Social Worker

## 2021-04-10 ENCOUNTER — Other Ambulatory Visit: Payer: Self-pay

## 2021-04-10 DIAGNOSIS — F411 Generalized anxiety disorder: Secondary | ICD-10-CM

## 2021-04-10 DIAGNOSIS — F332 Major depressive disorder, recurrent severe without psychotic features: Secondary | ICD-10-CM

## 2021-04-11 ENCOUNTER — Other Ambulatory Visit (HOSPITAL_COMMUNITY): Payer: BC Managed Care – PPO | Attending: Psychiatry | Admitting: Licensed Clinical Social Worker

## 2021-04-11 ENCOUNTER — Other Ambulatory Visit (HOSPITAL_COMMUNITY): Payer: BC Managed Care – PPO | Admitting: Occupational Therapy

## 2021-04-11 ENCOUNTER — Other Ambulatory Visit: Payer: Self-pay

## 2021-04-11 DIAGNOSIS — R41844 Frontal lobe and executive function deficit: Secondary | ICD-10-CM

## 2021-04-11 DIAGNOSIS — R4589 Other symptoms and signs involving emotional state: Secondary | ICD-10-CM | POA: Diagnosis not present

## 2021-04-11 DIAGNOSIS — F411 Generalized anxiety disorder: Secondary | ICD-10-CM | POA: Insufficient documentation

## 2021-04-11 DIAGNOSIS — F332 Major depressive disorder, recurrent severe without psychotic features: Secondary | ICD-10-CM

## 2021-04-11 NOTE — Therapy (Signed)
[facility] PARTIAL HOSPITALIZATION PROGRAM [address] [place], [place], [address] Phone: [phone]   Fax:  [phone] Virtual Visit via Video Note  I connected with Aleja Yearwood Husmann on 04/11/21 at  11:15 AM EDT by a video enabled telemedicine application and verified that I am speaking with the correct person using two identifiers.  Location: Patient: Patient Home Provider: Clinic Office   I discussed the limitations of evaluation and management by telemedicine and the availability of in person appointments. The patient expressed understanding and agreed to proceed.   I discussed the assessment and treatment plan with the patient. The patient was provided an opportunity to ask questions and all were answered. The patient agreed with the plan and demonstrated an understanding of the instructions.   The patient was advised to call back or seek an in-person evaluation if the symptoms worsen or if the condition fails to improve as anticipated.  I provided 82 minutes of non-face-to-face time during this encounter. 60 minutes OT Evaluation 22 minutes OT Group   Donne Hazel, OT   Occupational Therapy Evaluation  Patient Details  Name: MARCELLINA JONSSON MRN: [id number] Date of Birth: [birth year] No data recorded  Encounter Date: 04/11/2021   OT End of Session - 04/11/21 1348     Visit Number 1    Number of Visits 20    Date for OT Re-Evaluation 05/09/21    Authorization Type [employer]    OT Start Time 1115   OT Eval [id number]   OT Stop Time 1215    OT Time Calculation (min) 60 min    Activity Tolerance Patient tolerated treatment well    Behavior During Therapy [facility] for tasks assessed/performed             Past Medical History:  Diagnosis Date   Allergy    Anxiety    Arthritis    Asthma    Breast nodule 01/02/2015   Depression    Fatigue    GERD (gastroesophageal reflux disease)    Herpes    HPV in female    Obesity    PMDD  (premenstrual dysphoric disorder)    Thyroid disease    hypothryoidism   Trauma    Vaginal Pap smear, abnormal    Vitamin D deficiency     Past Surgical History:  Procedure Laterality Date   CESAREAN SECTION     CHOLECYSTECTOMY     TONSILECTOMY/ADENOIDECTOMY WITH MYRINGOTOMY     WISDOM TOOTH EXTRACTION      There were no vitals filed for this visit.   Subjective Assessment - 04/11/21 1347     Currently in Pain? No/denies              OT Education - 04/11/21 1347     Education Details Educated on OT role within [facility] programming in addition to sleep hygiene and strategies to improve overall sleep quality    Person(s) Educated Patient    Methods Explanation;Handout    Comprehension Verbalized understanding              OT Short Term Goals - 04/11/21 1352       OT SHORT TERM GOAL #1   Title Pt will actively engage in OT group sessions throughout duration of PHP programming, in order to promote daily structure, social engagement, and opportunities to develop and utilize adaptive strategies to maximize functional performance in preparation for safe transition and integration back into school, work, and the community.  Time 4    Period Weeks    Status New    Target Date 05/09/21      OT SHORT TERM GOAL #2   Title Pt will practice and identify 1-3 adaptive coping strategies she can utilize, in order to safely manage increased depression/anxiety, with min cues, in preparation for safe and healthy reintegration back into the community at discharge.    Time 4    Period Weeks    Status New    Target Date 05/09/21      OT SHORT TERM GOAL #3   Title Pt will identify 1-3 ways to structure her free time, in order to promote re-engagement in preferred leisure interests and establishment of a daily routine, in preparation for community reintegration.    Time 4    Period Weeks    Status New    Target Date 05/09/21           Occupational Therapy  Assessment 04/11/2021  Elnita MaxwellCheryl is a 45 y/o female with PMHx of depression and anxiety who [facility] individual therapist and outpatient psychiatrist for worsening depression and anxiety in the context of several psychosocial stressors. Pt reports main stressor is her work; currently works full time as a Manufacturing systems engineerpreschool teacher and reports her boss is "awful" and does not understand mental health. Pt also reports her physical health/wellness as another stressor, currently filling out paperwork for bariatric surgery and reports difficulty with mobility due to weight. Pt also reports that her husband is separating from her and will be moving out of the house at the end of the month. Pt reports desire to engage in PHP programming in order to manage identified stressors and to engage meaningfully in ADL/iADLs. Upon approach, pt presents as calm, cooperative, and forthcoming with information throughout OT Evaluation. Pt reports enjoying watching TV and swimming.   Precautions/Limitations: None noted/observed  Cognition: Appears intact   Visual Motor: Pt denies use of visual aids/deficits   Living Situation: Pt lives at home; reports husband is currently living with her until the remainder of the month and is moving out. Pt reports decision to separate was his decision, pt not in agreement and tearful when sharing.  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 Isolate from others, Use drugs/alcohol, Yell/Scream, Cry, Talk to someone, Sleep, and Listen to music   What helps when you are not feeling  well? Wrapping in a blanket, Deep breathing, Additional/Extra medication, Calling a friend or family member, Watching TV, Eating something, and Listening to music  What are some things that make it MORE difficult for you when you are already upset? Not having choices/input, Being alone/isolated, Not being able to express my opinion, People staring at me, and Being criticized  Is there anything specific that you would like help with while you're in the partial hospitalization program? Coping Skills, Communication, Self-Care, Sleep, Nutrition, and Self-esteem   What is your goal while you are here?  Get better  Assessment: Pt demonstrates behavior that inhibits/restricts participation in occupation and would benefit from skilled occupational therapy services to address current  difficulties with symptom management, emotion regulation, socialization, stress management, time management, job readiness, financial wellness, health and nutrition, sleep hygiene, ADL/iADL performance and leisure participation, in preparation for reintegration and return to community at discharge.   Plan: Pt will participate in skilled occupational therapy sessions (group and/or individual) in order to promote daily structure, social engagement, and opportunities to develop and utilize adaptive strategies to maximize functional performance in preparation for safe transition and integration back into school, work, and/or the community at discharge. OT sessions will occur 4-5 x per week for 2-4 weeks.   Donne Hazel, MOT, OTR/L  Group Session:  S: "I think I have sleep apnea or something, but I fall asleep at work and don't even realize it."  O: Today's group discussion focused on topic of Sleep Hygiene. Patients reflected on the quality of sleep they typically receive and identified areas that need improvement. Group was given background information on sleep and sleep hygiene, including common sleep disorders. Group  members also received information on how to improve one's sleep and introduced a sleep diary as a tool that can be utilized to track sleep quality over a length of time. Group session ended with patients identifying one or more strategies they could utilize or implement into their sleep routine in order to improve overall sleep quality.    A: Sahirah was active and independent in her participation of discussion and activity, sharing that she is awaiting a sleep trial, however is not sure if her insurance is going to cover it or not. She reports that she thinks she may have sleep apnea or narcolepsy, however is not sure. Of note, shortly after sharing, pt did fall asleep during group, intermittently and was overheard and observed snoring. Pt intermittently in and out of engagement and participation due to sedation. Appeared receptive to education and sleep hygiene strategies shared and reviewed.   P: Continue to attend PHP OT group sessions 5x week for 2 weeks to promote daily structure, social engagement, and opportunities to develop and utilize adaptive strategies to maximize functional performance in preparation for safe transition and integration back into school, work, and the community. Plan to address topic of wellness in next OT group session.  Plan - 04/11/21 1348     Clinical Impression Statement Eleen is a 45 y/o female with PMHx of depression and anxiety who was referred to the [facility] program for her individual therapist and outpatient psychiatrist for worsening depression and anxiety in the context of several psychosocial stressors. Pt reports main stressor is her work; currently works full time as a Manufacturing systems engineer and reports her boss is "awful" and does not understand mental health. Pt also reports her physical health/wellness as another stressor, currently filling out paperwork for bariatric surgery and reports difficulty with mobility due to weight. Pt also reports that her husband is  separating from her and will be moving out of the house at the end of the month. Pt reports desire to engage in PHP programming in order to manage identified stressors and to engage meaningfully in ADL/iADLs.    OT Occupational Profile and History Problem Focused Assessment - Including review of records relating to presenting problem    Occupational performance deficits (Please refer to evaluation for details): ADL's;IADL's;Rest and Sleep;Education;Work;Leisure;Social Participation    Body Structure / Function / Physical Skills ADL    Cognitive Skills Attention;Emotional;Energy/Drive;Learn;Memory;Perception;Problem Solve;Safety Awareness;Thought;Understand;Temperament/Personality    Psychosocial Skills Coping Strategies;Environmental  Adaptations;Habits;Interpersonal Interaction;Routines and Behaviors    Rehab Potential Good  Clinical Decision Making Limited treatment options, no task modification necessary    Comorbidities Affecting Occupational Performance: May have comorbidities impacting occupational performance    Modification or Assistance to Complete Evaluation  No modification of tasks or assist necessary to complete eval    OT Frequency 5x / week    OT Duration 4 weeks    OT Treatment/Interventions Self-care/ADL training;Patient/family education;Coping strategies training;Psychosocial skills training    Consulted and Agree with Plan of Care Patient             Patient will benefit from skilled therapeutic intervention in order to improve the following deficits and impairments:   Body Structure / Function / Physical Skills: ADL Cognitive Skills: Attention, Emotional, Energy/Drive, Learn, Memory, Perception, Problem Solve, Safety Awareness, Thought, Understand, Temperament/Personality Psychosocial Skills: Coping Strategies, Environmental  Adaptations, Habits, Interpersonal Interaction, Routines and Behaviors   Visit Diagnosis: Difficulty coping  Frontal lobe and executive  function deficit  GAD (generalized anxiety disorder)    Problem List   04/11/2021  Donne Hazel, MOT, OTR/L  04/11/2021, 1:54 PM  [facility] HOSPITALIZATION PROGRAM [address] [place], [place], [address] Phone: [phone]   Fax:  [phone]  Name: JORDANNE ELSBURY MRN: [id number] Date of Birth: 06/13/1976

## 2021-04-12 ENCOUNTER — Other Ambulatory Visit: Payer: Self-pay

## 2021-04-12 ENCOUNTER — Other Ambulatory Visit (HOSPITAL_COMMUNITY): Payer: BC Managed Care – PPO | Attending: Psychiatry | Admitting: Licensed Clinical Social Worker

## 2021-04-12 ENCOUNTER — Other Ambulatory Visit (HOSPITAL_COMMUNITY): Payer: BC Managed Care – PPO

## 2021-04-12 DIAGNOSIS — F331 Major depressive disorder, recurrent, moderate: Secondary | ICD-10-CM

## 2021-04-12 DIAGNOSIS — F411 Generalized anxiety disorder: Secondary | ICD-10-CM

## 2021-04-12 DIAGNOSIS — F332 Major depressive disorder, recurrent severe without psychotic features: Secondary | ICD-10-CM

## 2021-04-12 NOTE — Progress Notes (Signed)
Virtual Visit via Video Note  I connected with [name] on 04/12/21 at  9:00 AM EDT by a video enabled telemedicine application and verified that I am speaking with the correct person using two identifiers.  Location: Patient: Home Provider: Office   I discussed the limitations of evaluation and management by telemedicine and the availability of in person appointments. The patient expressed understanding and agreed to proceed.   I discussed the assessment and treatment plan with the patient. The patient was provided an opportunity to ask questions and all were answered. The patient agreed with the plan and demonstrated an understanding of the instructions.   The patient was advised to call back or seek an in-person evaluation if the symptoms worsen or if the condition fails to improve as anticipated.  I provided 15 minutes of non-face-to-face time during this encounter.   Anita Rack, NP    Behavioral Health Partial Program Assessment Note  Date: 04/12/2021 Name: [name] MRN: [id number]    [name]  is a 45 y.o. Caucasian female presents with worsening anxiety. States she is stressed and overwhelm with her employer.  Reported strained relationship between she and her husband.  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  She denied previous inpatient admissions.  Denied previous suicide attempts.  Reports social isolation , mood irritability poor concentration and depressed mood.  [name] reported she is currently followed by Dr. Michae [name] with a recent medication adjustment however states she is been unable to afford medication so she is continue to take same dose. patient was enrolled in partial psychiatric program on 04/12/21.  Primary complaints include: anxiety, feeling depressed, and poor concentration.  Onset of symptoms was gradual with gradually worsening course since that time. Psychosocial Stressors include the following:  family and financial.   I have reviewed the following documentation dated 04/14/2021: past psychiatric history, past medical history, and past social and family history  Complaints of Pain: nonear Past Psychiatric History:  First psychiatric contact , Past psychiatric hospitalizations , and Past medication trials   Currently in treatment with .  Substance Abuse History: alcohol and marijuana Use of Alcohol: denied Use of Caffeine: denies use Use of over the counter:   Past Surgical History:  Procedure Laterality Date   CESAREAN SECTION     CHOLECYSTECTOMY     TONSILECTOMY/ADENOIDECTOMY WITH MYRINGOTOMY     WISDOM TOOTH EXTRACTION      Past Medical History:  Diagnosis Date   Allergy    Anxiety    Arthritis    Asthma    Breast nodule 01/02/2015   Depression    Fatigue    GERD (gastroesophageal reflux disease)    Herpes    HPV in female    Obesity    PMDD (premenstrual dysphoric disorder)    Thyroid disease    hypothryoidism   Trauma    Vaginal Pap smear, abnormal    Vitamin D deficiency    Outpatient Encounter Medications as of 04/12/2021  Medication Sig   ALPRAZolam (XANAX) 0.5 MG tablet Take 1 tablet (0.5 mg total) by mouth 2 (two) times daily as needed for anxiety.   augmented betamethasone dipropionate (DIPROLENE-AF) 0.05 % cream    busPIRone (BUSPAR) 10 MG tablet Take 2 tablets (20 mg total) by mouth 2 (two) times daily.   calcium-vitamin D (OSCAL WITH D) 500-200 MG-UNIT tablet Take 1 tablet by mouth.   cetirizine (ZYRTEC) 10 MG tablet Take 10 mg by mouth  daily.   ibuprofen (ADVIL) 600 MG tablet Take 600 mg by mouth 3 (three) times daily. As needed   levalbuterol (XOPENEX HFA) 45 MCG/ACT inhaler Inhale into the lungs every 4 (four) hours as needed for wheezing.   levothyroxine (SYNTHROID) 200 MCG tablet TAKE 1 TABLET(200 MCG) BY MOUTH DAILY BEFORE AND BREAKFAST   levothyroxine (SYNTHROID) 75 MCG tablet TAKE 1 TABLET(75 MCG) BY MOUTH DAILY   MELATONIN PO Take by  mouth at bedtime.   Misc Natural Products (CRANBERRY/PROBIOTIC PO) Take by mouth daily. Cranberry/Probiotic/Prebiotic Blend-2 daily   montelukast (SINGULAIR) 10 MG tablet Take 10 mg by mouth daily.    Multiple Vitamins-Minerals (WOMENS MULTI VITAMIN & MINERAL PO) Take by mouth daily.   norethindrone (AYGESTIN) 5 MG tablet TAKE 2 TABLETS BY MOUTH DAILY   venlafaxine XR (EFFEXOR-XR) 75 MG 24 hr capsule Take 3 capsules (225 mg total) by mouth daily with breakfast.   ziprasidone (GEODON) 20 MG capsule Take 1 capsule (20 mg total) by mouth daily with breakfast AND 2 capsules (40 mg total) every evening.   No facility-administered encounter medications on file as of 04/12/2021.   Allergies  Allergen Reactions   Latex    Naproxen    Shrimp [Shellfish Allergy] Other (See Comments)    Intolerance     Social History   Tobacco Use   Smoking status: Former    Pack years: 0.00   Smokeless tobacco: Never   Tobacco comments:    light smoker as a teenager   Substance Use Topics   Alcohol use: Yes    Comment: occ   Functioning Relationships: good support system Education: Other (Specify any learning disability, behavioral disorder, special education needs, difficulty reading.):  Other Pertinent History: None Family History  Problem Relation Age of Onset   Hypertension Mother    Arthritis Mother    Anxiety disorder Mother    Asthma Father    COPD Father    Arthritis Father    Chronic bronchitis Father    Hypertension Father    Hyperlipidemia Father    Anxiety disorder Father    Depression Father    Alcohol abuse Father    Drug abuse Brother    Alcohol abuse Brother    Hypertension Brother    Mental illness Brother    Depression Brother    Cancer Maternal Aunt    Cancer Maternal Uncle    Cancer Paternal Aunt    Cancer Paternal Uncle    Asthma Maternal Grandmother    Arthritis Maternal Grandmother    Hypertension Maternal Grandmother    Congestive Heart Failure Maternal  Grandmother    Diabetes Maternal Grandfather    Stroke Maternal Grandfather    ADD / ADHD Son    Hyperlipidemia Brother    Hearing loss Brother    Asthma Brother    Learning disabilities Brother      Review of Systems Constitutional: negative  Objective:  There were no vitals filed for this visit.  Physical Exam:   Mental Status Exam: Appearance:  Well groomed Psychomotor::  Within Normal Limits Attention span and concentration: Normal Behavior: calm, cooperative, and adequate rapport can be established Speech:  normal volume Mood:  depressed and anxious Affect:  normal Thought Process:  Coherent Thought Content:  Logical Orientation:  person, place, and time/date Cognition:  grossly intact Insight:  Intact Judgment:  Intact Estimate of Intelligence: Average Fund of knowledge: Aware of current events Memory: Recent and remote intact Abnormal movements: None Gait and station: Normal  Assessment:  Diagnosis: MDD (major depressive disorder), recurrent episode, moderate (HCC) [F33.1] 1. MDD (major depressive disorder), recurrent episode, moderate (HCC)   2. GAD (generalized anxiety disorder)     Indications for admission: inpatient care required if not in partial hospital program  Plan: patient enrolled in Partial Hospitalization Program, patient's current medications are to be continued, a comprehensive treatment plan will be developed, and side effects of medications have been reviewed with patient  Treatment options and alternatives reviewed with patient and patient does not understand the above plan,   Treatment plan was reviewed and agreed upon by NP T.Melvyn Neth and patient Verdie Wilms need for group services.     Anita Rack, NP

## 2021-04-14 ENCOUNTER — Encounter (HOSPITAL_COMMUNITY): Payer: Self-pay | Admitting: Family

## 2021-04-16 ENCOUNTER — Encounter (HOSPITAL_COMMUNITY): Payer: Self-pay

## 2021-04-16 ENCOUNTER — Ambulatory Visit (HOSPITAL_COMMUNITY): Payer: BC Managed Care – PPO | Admitting: Psychiatry

## 2021-04-16 ENCOUNTER — Other Ambulatory Visit (HOSPITAL_COMMUNITY): Payer: BC Managed Care – PPO | Admitting: Licensed Clinical Social Worker

## 2021-04-16 ENCOUNTER — Other Ambulatory Visit (HOSPITAL_COMMUNITY): Payer: BC Managed Care – PPO | Admitting: Occupational Therapy

## 2021-04-16 ENCOUNTER — Other Ambulatory Visit: Payer: Self-pay

## 2021-04-16 DIAGNOSIS — F411 Generalized anxiety disorder: Secondary | ICD-10-CM | POA: Diagnosis not present

## 2021-04-16 DIAGNOSIS — F332 Major depressive disorder, recurrent severe without psychotic features: Secondary | ICD-10-CM

## 2021-04-16 DIAGNOSIS — F331 Major depressive disorder, recurrent, moderate: Secondary | ICD-10-CM | POA: Diagnosis not present

## 2021-04-16 DIAGNOSIS — R41844 Frontal lobe and executive function deficit: Secondary | ICD-10-CM

## 2021-04-16 DIAGNOSIS — R4589 Other symptoms and signs involving emotional state: Secondary | ICD-10-CM

## 2021-04-16 NOTE — Therapy (Signed)
[facility] PARTIAL HOSPITALIZATION PROGRAM [address] [place], [place], [address] Phone: [phone]   Fax:  [phone] Virtual Visit via Video Note  I connected with [name] on 04/16/21 at  11:00 AM EDT by a video enabled telemedicine application and verified that I am speaking with the correct person using two identifiers.  Location: Patient: Patient Home Provider: Clinic Office   I discussed the limitations of evaluation and management by telemedicine and the availability of in person appointments. The patient expressed understanding and agreed to proceed.  I discussed the assessment and treatment plan with the patient. The patient was provided an opportunity to ask questions and all were answered. The patient agreed with the plan and demonstrated an understanding of the instructions.   The patient was advised to call back or seek an in-person evaluation if the symptoms worsen or if the condition fails to improve as anticipated.  I provided 60 minutes of non-face-to-face time during this encounter.   Donne Hazel, OT   Occupational Therapy Treatment  Patient Details  Name: [name] MRN: [id number] Date of Birth: 18-Aug-1976 No data recorded  Encounter Date: 04/16/2021   OT End of Session - 04/16/21 1426     Visit Number 2    Number of Visits 20    Date for OT Re-Evaluation 05/09/21    Authorization Type [employer]    OT Start Time 1115    OT Stop Time 1215    OT Time Calculation (min) 60 min    Activity Tolerance Patient tolerated treatment well    Behavior During Therapy [facility] for tasks assessed/performed             Past Medical History:  Diagnosis Date   Allergy    Anxiety    Arthritis    Asthma    Breast nodule 01/02/2015   Depression    Fatigue    GERD (gastroesophageal reflux disease)    Herpes    HPV in female    Obesity    PMDD (premenstrual dysphoric disorder)    Thyroid disease    hypothryoidism    Trauma    Vaginal Pap smear, abnormal    Vitamin D deficiency     Past Surgical History:  Procedure Laterality Date   CESAREAN SECTION     CHOLECYSTECTOMY     TONSILECTOMY/ADENOIDECTOMY WITH MYRINGOTOMY     WISDOM TOOTH EXTRACTION      There were no vitals filed for this visit.   Subjective Assessment - 04/16/21 1426     Currently in Pain? No/denies             OT Education - 04/16/21 1426     Education Details Educated on the 5 F's and provided resources/strategies/tips to improve overall health and wellness    Person(s) Educated Patient    Methods Explanation;Handout    Comprehension Verbalized understanding              OT Short Term Goals - 04/16/21 1427       OT SHORT TERM GOAL #1   Status On-going      OT SHORT TERM GOAL #2   Status On-going      OT SHORT TERM GOAL #3   Status On-going           Group Session:  S: "I think I struggle the most with friendship with myself, I'm the hardest critic of myself."  O: Today's group session focused on the topic of  health and wellness as it relates to the impact on mental health. Discussion focused on identifying the 5 F's to wellness including Food, Fitness, Fresh air, Fellowship, and Friendship with self and soul. Group members identified areas of wellness that they would like to improve upon and were educated and offered various resources. Discussion also focused on how the food we eat impacts our mental health, along with the benefits of engaging in physical activity/exercise and getting outside for fresh air. Discussion wrapped up with group members identifying one area of wellness they could improve upon and identified a strategy to do so.    A: [name] was active and independent in her participation of discussion and activity, sharing that she struggles the most with 'friendship with self' and noted she is the hardest critic and always puts the blame on herself. Pt appeared actively engaged in discussion,  receptive to education received, and asked several questions regarding content presented.   P: Continue to attend PHP OT group sessions 5x week for 2 weeks to promote daily structure, social engagement, and opportunities to develop and utilize adaptive strategies to maximize functional performance in preparation for safe transition and integration back into school, work, and the community.    Plan - 04/16/21 1426     Occupational performance deficits (Please refer to evaluation for details): ADL's;IADL's;Rest and Sleep;Education;Work;Leisure;Social Participation    Body Structure / Function / Physical Skills ADL    Cognitive Skills Attention;Emotional;Energy/Drive;Learn;Memory;Perception;Problem Solve;Safety Awareness;Thought;Understand;Temperament/Personality    Psychosocial Skills Coping Strategies;Environmental  Adaptations;Habits;Interpersonal Interaction;Routines and Behaviors             Patient will benefit from skilled therapeutic intervention in order to improve the following deficits and impairments:   Body Structure / Function / Physical Skills: ADL Cognitive Skills: Attention, Emotional, Energy/Drive, Learn, Memory, Perception, Problem Solve, Safety Awareness, Thought, Understand, Temperament/Personality Psychosocial Skills: Coping Strategies, Environmental  Adaptations, Habits, Interpersonal Interaction, Routines and Behaviors   Visit Diagnosis: Difficulty coping  Frontal lobe and executive function deficit  Severe episode of recurrent major depressive disorder, without psychotic features (HCC)    04/16/2021  Donne Hazel, MOT, OTR/L  Alford Kendle Turbin 04/16/2021, 2:27 PM  [facility] PARTIAL HOSPITALIZATION PROGRAM [address] [place], [place], [address] Phone: [phone]   Fax:  [phone]  Name: [name] MRN: [id number] Date of Birth: Feb 25, 1976

## 2021-04-17 ENCOUNTER — Encounter (HOSPITAL_COMMUNITY): Payer: Self-pay

## 2021-04-17 ENCOUNTER — Other Ambulatory Visit (HOSPITAL_COMMUNITY): Payer: BC Managed Care – PPO | Admitting: Licensed Clinical Social Worker

## 2021-04-17 ENCOUNTER — Other Ambulatory Visit: Payer: Self-pay

## 2021-04-17 ENCOUNTER — Other Ambulatory Visit (HOSPITAL_COMMUNITY): Payer: BC Managed Care – PPO | Admitting: Occupational Therapy

## 2021-04-17 DIAGNOSIS — F331 Major depressive disorder, recurrent, moderate: Secondary | ICD-10-CM

## 2021-04-17 DIAGNOSIS — F411 Generalized anxiety disorder: Secondary | ICD-10-CM

## 2021-04-17 DIAGNOSIS — F332 Major depressive disorder, recurrent severe without psychotic features: Secondary | ICD-10-CM

## 2021-04-17 DIAGNOSIS — R4589 Other symptoms and signs involving emotional state: Secondary | ICD-10-CM

## 2021-04-17 DIAGNOSIS — R41844 Frontal lobe and executive function deficit: Secondary | ICD-10-CM

## 2021-04-17 NOTE — Progress Notes (Signed)
Virtual Visit via Video Note  I connected with [name] on 04/17/21 at  9:00 AM EDT by a video enabled telemedicine application and verified that I am speaking with the correct person using two identifiers.  Location: Patient: Home Provider: Office   I discussed the limitations of evaluation and management by telemedicine and the availability of in person appointments. The patient expressed understanding and agreed to proceed.   I discussed the assessment and treatment plan with the patient. The patient was provided an opportunity to ask questions and all were answered. The patient agreed with the plan and demonstrated an understanding of the instructions.   The patient was advised to call back or seek an in-person evaluation if the symptoms worsen or if the condition fails to improve as anticipated.  I provided 15  minutes of non-face-to-face time during this encounter.   [name], NP   [facility] MD/PA/NP OP Progress Note  04/17/2021 11:32 AM [name]  MRN:  [id number]  [name] was seen and evaluated via WebEx. She presents flat, guarded and depressed.  Reports struggling with multiple stressors. She reported and " I had a rough weekend."  States her main stressor continues to be disagreements between she and her husband.  She did not denied suicidal or homicidal ideations however,does report " I wish I was not here."  Reports low motivation.  States ongoing symptoms of worry related to her mother's declining health.   Stated " I do not have energy to take care of myself let alone anyone else."   [name] has a follow-up with attending psychiatrist on 04/18/2021 to discuss affordability of medications and possible medicaitons adjustment.  [name] to continue partial hospitalization programming.  Support ,encouragement and reassurance was provided.  Visit Diagnosis:    ICD-10-CM   1. MDD (major depressive disorder), recurrent episode, moderate (HCC)  F33.1     2. GAD  (generalized anxiety disorder)  F41.1       Past Psychiatric History:   Past Medical History:  Past Medical History:  Diagnosis Date   Allergy    Anxiety    Arthritis    Asthma    Breast nodule 01/02/2015   Depression    Fatigue    GERD (gastroesophageal reflux disease)    Herpes    HPV in female    Obesity    PMDD (premenstrual dysphoric disorder)    Thyroid disease    hypothryoidism   Trauma    Vaginal Pap smear, abnormal    Vitamin D deficiency     Past Surgical History:  Procedure Laterality Date   CESAREAN SECTION     CHOLECYSTECTOMY     TONSILECTOMY/ADENOIDECTOMY WITH MYRINGOTOMY     WISDOM TOOTH EXTRACTION      Family Psychiatric History:   Family History:  Family History  Problem Relation Age of Onset   Hypertension Mother    Arthritis Mother    Anxiety disorder Mother    Asthma Father    COPD Father    Arthritis Father    Chronic bronchitis Father    Hypertension Father    Hyperlipidemia Father    Anxiety disorder Father    Depression Father    Alcohol abuse Father    Drug abuse Brother    Alcohol abuse Brother    Hypertension Brother    Mental illness Brother    Depression Brother    Cancer Maternal Aunt    Cancer Maternal Uncle    Cancer Paternal Aunt  Cancer Paternal Uncle    Asthma Maternal Grandmother    Arthritis Maternal Grandmother    Hypertension Maternal Grandmother    Congestive Heart Failure Maternal Grandmother    Diabetes Maternal Grandfather    Stroke Maternal Grandfather    ADD / ADHD Son    Hyperlipidemia Brother    Hearing loss Brother    Asthma Brother    Learning disabilities Brother     Social History:  Social History   Socioeconomic History   Marital status: Married    Spouse name: Not on file   Number of children: 2   Years of education: Not on file   Highest education level: Associate degree: occupational, Scientist, product/process development, or vocational program  Occupational History   Not on file  Tobacco Use   Smoking  status: Former    Pack years: 0.00   Smokeless tobacco: Never   Tobacco comments:    light smoker as a teenager   Building services engineer Use: Never used  Substance and Sexual Activity   Alcohol use: Yes    Comment: occ   Drug use: No   Sexual activity: Yes    Birth control/protection: Pill  Other Topics Concern   Not on file  Social History Narrative   Social Hx:   Current living situation- living in [place] with husband, son is 50/50 custody with biological father, daughter lives with pt's mom in [place]. Pt lives close to her mom   Raised in [place] by mom and dad   Siblings- 2 brothers and pt is the youngest. She is 33 yrs younger than her youngest brother   Schooling- associates degree in early childhood   Married- yes - currently in 3rd marriage   Kids- 2 from 1st marriage      Legal issues- denies   Social Determinants of Corporate investment banker Strain: Not on file  Food Insecurity: Not on file  Transportation Needs: Not on file  Physical Activity: Not on file  Stress: Not on file  Social Connections: Not on file    Allergies:  Allergies  Allergen Reactions   Latex    Naproxen    Shrimp [Shellfish Allergy] Other (See Comments)    Intolerance     Metabolic Disorder Labs: No results found for: HGBA1C, MPG No results found for: PROLACTIN Lab Results  Component Value Date   CHOL 193 05/25/2020   TRIG 102 05/25/2020   HDL 42 05/25/2020   LDLCALC 100 05/25/2020   Lab Results  Component Value Date   TSH 1.460 11/27/2020   TSH 2.740 07/19/2020    Therapeutic Level Labs: No results found for: LITHIUM No results found for: VALPROATE No components found for:  CBMZ  Current Medications: Current Outpatient Medications  Medication Sig Dispense Refill   ALPRAZolam (XANAX) 0.5 MG tablet Take 1 tablet (0.5 mg total) by mouth 2 (two) times daily as needed for anxiety. 60 tablet 0   augmented betamethasone dipropionate (DIPROLENE-AF) 0.05 % cream       busPIRone (BUSPAR) 10 MG tablet Take 2 tablets (20 mg total) by mouth 2 (two) times daily. 360 tablet 0   calcium-vitamin D (OSCAL WITH D) 500-200 MG-UNIT tablet Take 1 tablet by mouth.     cetirizine (ZYRTEC) 10 MG tablet Take 10 mg by mouth daily.     ibuprofen (ADVIL) 600 MG tablet Take 600 mg by mouth 3 (three) times daily. As needed     levalbuterol (XOPENEX HFA) 45 MCG/ACT inhaler Inhale into  the lungs every 4 (four) hours as needed for wheezing.     levothyroxine (SYNTHROID) 200 MCG tablet TAKE 1 TABLET(200 MCG) BY MOUTH DAILY BEFORE AND BREAKFAST 90 tablet 1   levothyroxine (SYNTHROID) 75 MCG tablet TAKE 1 TABLET(75 MCG) BY MOUTH DAILY 90 tablet 0   MELATONIN PO Take by mouth at bedtime.     Misc Natural Products (CRANBERRY/PROBIOTIC PO) Take by mouth daily. Cranberry/Probiotic/Prebiotic Blend-2 daily     montelukast (SINGULAIR) 10 MG tablet Take 10 mg by mouth daily.      Multiple Vitamins-Minerals (WOMENS MULTI VITAMIN & MINERAL PO) Take by mouth daily.     norethindrone (AYGESTIN) 5 MG tablet TAKE 2 TABLETS BY MOUTH DAILY 60 tablet 3   venlafaxine XR (EFFEXOR-XR) 75 MG 24 hr capsule Take 3 capsules (225 mg total) by mouth daily with breakfast. 270 capsule 0   ziprasidone (GEODON) 20 MG capsule Take 1 capsule (20 mg total) by mouth daily with breakfast AND 2 capsules (40 mg total) every evening. 270 capsule 0   No current facility-administered medications for this visit.     Musculoskeletal: Strength & Muscle Tone: within normal limits Gait & Station: normal Patient leans: N/A  Psychiatric Specialty Exam: Review of Systems  There were no vitals taken for this visit.There is no height or weight on file to calculate BMI.  General Appearance: Casual and Guarded  Eye Contact:  Fair  Speech:  Clear and Coherent  Volume:  Normal  Mood:  Depressed and Worthless  Affect:  Depressed and Flat  Thought Process:  Coherent  Orientation:  Full (Time, Place, and Person)  Thought  Content: Logical   Suicidal Thoughts:  No  Homicidal Thoughts:  No  Memory:  Immediate;   Fair Recent;   Fair  Judgement:  Fair  Insight:  Fair  Psychomotor Activity:  Normal  Concentration:  Concentration: Good  Recall:  Good  Fund of Knowledge: Good  Language: Good  Akathisia:  No  Handed:  Right  AIMS (if indicated): done  Assets:  Communication Skills Physical Health Resilience Social Support  ADL's:  Intact  Cognition: WNL  Sleep:  Fair   Screenings: GAD-7    Advertising copywriter from [facility]26[facility]2020 [facility] Counselor from 03/02/2020 in [facility]  Total GAD-7 Score 9 15      PHQ2-9    Flowsheet Row Counselor from 04/17/2021 in [facility] PARTIAL HOSPITALIZATION PROGRAM Video Visit from 04/04/2021 in [facility] Video Visit from 02/07/2021 in [facility] Counselor from 11/26/2020 in [facility] Office Visit from 11/29/2019 in [facility]  PHQ-2 Total Score 6 6 2 2 6   PHQ-9 Total Score 22 23 12 7 23       Flowsheet Row Counselor from 04/17/2021 in [facility] PARTIAL HOSPITALIZATION PROGRAM Video Visit from 04/04/2021 in [facility] Video Visit from 02/07/2021 in [facility]  C-SSRS RISK CATEGORY Error: Q3, 4, or 5 should not be populated when Q2 is No Error: Q3, 4, or 5 should not be populated when Q2 is No No Risk        Assessment and Plan:  Patient to continue Partial Hospitalization Programming  -Keep follow-up appointment with attending psychiatrist on 04/18/2021  Treatment plan was reviewed and agreed upon by NP T. 02/09/2021 and patient [name] need for continued group services   Melvyn Neth, NP 04/17/2021, 11:32  AM

## 2021-04-17 NOTE — Progress Notes (Signed)
Spoke with patient via Webex video call, used 2 identifiers to correctly identify patient. This is her first time in PHP being referred by Dr. Randa Evens for Depression and Anxiety. She has work stress, her husband is moving out and they are splitting up, her son is staying with his father most of the time, and her mother is not doing well. She is tearful when discussing her depression. Denies SI/HI or AV hallucinations but does admit to wishing she wouldn't wake up. Life feels very heavy for her at this time. She is an emotional eater and has gained 100 pounds in a year. On a scale 1-10 as 10 being the worst she rates depression at 9 and anxiety at 7. PHQ9=22. No issues or complaints. No side effects from medications.

## 2021-04-18 ENCOUNTER — Other Ambulatory Visit (HOSPITAL_COMMUNITY): Payer: BC Managed Care – PPO

## 2021-04-18 ENCOUNTER — Telehealth (INDEPENDENT_AMBULATORY_CARE_PROVIDER_SITE_OTHER): Payer: BC Managed Care – PPO | Admitting: Psychiatry

## 2021-04-18 ENCOUNTER — Other Ambulatory Visit: Payer: Self-pay

## 2021-04-18 ENCOUNTER — Encounter (HOSPITAL_COMMUNITY): Payer: Self-pay

## 2021-04-18 DIAGNOSIS — F411 Generalized anxiety disorder: Secondary | ICD-10-CM | POA: Diagnosis not present

## 2021-04-18 DIAGNOSIS — R454 Irritability and anger: Secondary | ICD-10-CM

## 2021-04-18 DIAGNOSIS — F331 Major depressive disorder, recurrent, moderate: Secondary | ICD-10-CM | POA: Diagnosis not present

## 2021-04-18 MED ORDER — ZIPRASIDONE HCL 40 MG PO CAPS: 40.0000 mg | ORAL_CAPSULE | Freq: Two times a day (BID) | ORAL | 1 refills | Status: DC

## 2021-04-18 NOTE — Progress Notes (Signed)
Spiritual care group   04/17/2021 11:00-12:00  Group met via web-ex due to COVID-19 precautions.  Group facilitated by Simone Curia, MDiv, BCC   Group focused on topic of "self-care"  Patients engaged in facilitated discussion about topic.  Explored quotes related to self care and chose one which they agreed with and one which they disliked.  Engaged in discussion around quote choices and their experience / understanding of care for themselves.

## 2021-04-18 NOTE — Progress Notes (Signed)
Virtual Visit via Video Note  I connected with [name] on 04/18/21 at  8:45 AM EDT by a video enabled telemedicine application and verified that I am speaking with the correct person using two identifiers.  Location: Patient: home Provider: office   I discussed the limitations of evaluation and management by telemedicine and the availability of in person appointments. The patient expressed understanding and agreed to proceed.  History of Present Illness: [name] is engaging in PHP until 04/26/21. She has significant stressors of her mom being sick and marital problems. These last few days she has been sleeping a lot. She is talking about it in therapy and it helps. Her anxiety has not been as bad since she is out of work. Her work really contributes to her overall anxiety. She has been anxious about her mom's health and her being in the hospital and the problems she is having with her husband. [name] has low stress tolerance. Her depression is unchanged. She struggles to get out of bed and has no motivation to do anything. [name] tries to get out of bed so that she is not there all day. She will watch tv and last week she even went to the pool a couple of times. It made her feel better. She is not doing things around the house. She feels like she is moving thru mud. Her appetite is variable.  She has continues to experience negative self talk and passive thoughts of death. She denies SI/HI. [name] has only been taking Geodon 20mg  BID because insurance cost with high when increased to Geodon 20mg  qAM and 40mg  qPM. Due to mobility issues she does think she will be able to find a new job so she plans to continue with her current job. She plans to use things she learned in PHP to help manage her work stress and anxiety.    Observations/Objective: Psychiatric Specialty Exam: ROS  There were no vitals taken for this visit.There is no height or weight on file to calculate BMI.  General Appearance:  Disheveled  Eye Contact:  Fair  Speech:  Clear and Coherent and Slow  Volume:  Normal  Mood:  Anxious and Depressed  Affect:  Congruent  Thought Process:  Coherent, Linear, and Descriptions of Associations: Intact  Orientation:  Full (Time, Place, and Person)  Thought Content:  Logical  Suicidal Thoughts:  No  Homicidal Thoughts:  No  Memory:  Immediate;   Good  Judgement:  Fair  Insight:  Present  Psychomotor Activity:  Normal  Concentration:  Concentration: Good  Recall:  Good  Fund of Knowledge:  Good  Language:  Good  Akathisia:  No  Handed:  Right  AIMS (if indicated):     Assets:  Communication Skills Desire for Improvement Financial Resources/Insurance Housing Social Support Talents/Skills Transportation Vocational/Educational  ADL's:  Intact  Cognition:  WNL  Sleep:        Assessment and Plan:  Depression screen [facility] 2/9 04/18/2021 04/17/2021 04/04/2021 02/07/2021 11/26/2020  Decreased Interest 2 3 3  - 1  Down, Depressed, Hopeless 3 3 3 2 1   PHQ - 2 Score 5 6 6 2 2   Altered sleeping 0 3 0 3 0  Tired, decreased energy 3 3 3 3 2   Change in appetite 1 3 3 1 1   Feeling bad or failure about yourself  3 3 3 2 1   Trouble concentrating 1 1 3  0 0  Moving slowly or fidgety/restless 3 1 2  0 0  Suicidal thoughts  2 2 3 1 1   PHQ-9 Score 18 22 23 12 7   Difficult doing work/chores Extremely dIfficult Extremely dIfficult Extremely dIfficult Somewhat difficult Somewhat difficult   Flowsheet Row Video Visit from 04/18/2021 in [facility] Counselor from 04/17/2021 in [facility]IAL HOSPITALIZATION PROGRAM Video Visit from 04/04/2021 in [facility]  C-SSRS RISK CATEGORY Error: Q3, 4, or 5 should not be populated when Q2 is No Error: Q3, 4, or 5 should not be populated when Q2 is No Error: Q3, 4, or 5 should not be populated when Q2 is No       - in PHP and finding benefit  -increase Geodon to  40mg  BID  1. MDD (major depressive disorder), recurrent episode, moderate (HCC) - ziprasidone (GEODON) 40 MG capsule; Take 1 capsule (40 mg total) by mouth 2 (two) times daily with a meal.  Dispense: 60 capsule; Refill: 1  2. GAD (generalized anxiety disorder) - ziprasidone (GEODON) 40 MG capsule; Take 1 capsule (40 mg total) by mouth 2 (two) times daily with a meal.  Dispense: 60 capsule; Refill: 1  3. Irritability and anger - ziprasidone (GEODON) 40 MG capsule; Take 1 capsule (40 mg total) by mouth 2 (two) times daily with a meal.  Dispense: 60 capsule; Refill: 1   Follow Up Instructions: In 1-3 weeks or sooner if needed   I discussed the assessment and treatment plan with the patient. The patient was provided an opportunity to ask questions and all were answered. The patient agreed with the plan and demonstrated an understanding of the instructions.   The patient was advised to call back or seek an in-person evaluation if the symptoms worsen or if the condition fails to improve as anticipated.  I provided 18 minutes of non-face-to-face time during this encounter.   06/18/2021, MD

## 2021-04-18 NOTE — Therapy (Signed)
[facility] PARTIAL HOSPITALIZATION PROGRAM [address] [place], [place], [address] Phone: [phone]   Fax:  [phone] Virtual Visit via Video Note  I connected with [name] on 04/17/21 at  12:00 PM EDT by a video enabled telemedicine application and verified that I am speaking with the correct person using two identifiers.  Location: Patient: Patient Home Provider: Clinic Office   I discussed the limitations of evaluation and management by telemedicine and the availability of in person appointments. The patient expressed understanding and agreed to proceed.   I discussed the assessment and treatment plan with the patient. The patient was provided an opportunity to ask questions and all were answered. The patient agreed with the plan and demonstrated an understanding of the instructions.   The patient was advised to call back or seek an in-person evaluation if the symptoms worsen or if the condition fails to improve as anticipated.  I provided 40 minutes of non-face-to-face time during this encounter.   Donne Hazel, OT   Occupational Therapy Treatment  Patient Details  Name: [name] MRN: [id number] Date of Birth: [birth year] No data recorded  Encounter Date: 04/17/2021   OT End of Session - 04/18/21 0751     Visit Number 3    Number of Visits 20    Date for OT Re-Evaluation 05/09/21    Authorization Type BCBS    Authorization - Number of Visits 17    OT Start Time 1210    OT Stop Time 1250    OT Time Calculation (min) 40 min    Activity Tolerance Patient limited by fatigue    Behavior During Therapy [facility] for tasks assessed/performed             Past Medical History:  Diagnosis Date   Allergy    Anxiety    Arthritis    Asthma    Breast nodule 01/02/2015   Depression    Fatigue    GERD (gastroesophageal reflux disease)    Herpes    HPV in female    Obesity    PMDD (premenstrual dysphoric disorder)     Thyroid disease    hypothryoidism   Trauma    Vaginal Pap smear, abnormal    Vitamin D deficiency     Past Surgical History:  Procedure Laterality Date   CESAREAN SECTION     CHOLECYSTECTOMY     TONSILECTOMY/ADENOIDECTOMY WITH MYRINGOTOMY     WISDOM TOOTH EXTRACTION      There were no vitals filed for this visit.   Subjective Assessment - 04/18/21 0751     Currently in Pain? No/denies                                  OT Education - 04/18/21 0751     Education Details Educated on how to navigate aggressive communication and reviewed strategies/tips to "fight fair"    Person(s) Educated Patient    Methods Explanation;Handout    Comprehension Verbalized understanding              OT Short Term Goals - 04/16/21 1427       OT SHORT TERM GOAL #1   Status On-going      OT SHORT TERM GOAL #2   Status On-going      OT SHORT TERM GOAL #3   Status On-going  Group Session:  S: "I think this would be helpful because my husband and I are not getting along right now and arguing a lot."  O: Group began with a reflection from previous OT session on assertiveness skills and training. Today's group was an expansion on assertiveness and communication with a focus on Fair Fighting. Group members were provided with a set of 'rules' and tips on how to fight fairly, in order to practice being more assertive and less aggressive. Members identified one rule or tip they felt they struggled most with and were encouraged to focus on improving their communication within identified rule.    A: [name] was open and actively engaged in today's session, sharing that she and her husband are currently going through a tough time and argue a lot. They are in the midst of a possible separation and shared they are not seeing eye to eye right now which often brings up conflict, noting they argued yesterday and she cried herself to sleep for how poorly she felt.  Appeared mostly receptive to use of fair fighting tips, although had some difficulty sustaining attention due to fatigue and falling asleep during session.   P: Continue to attend PHP OT group sessions 5x week for 2 weeks to promote daily structure, social engagement, and opportunities to develop and utilize adaptive strategies to maximize functional performance in preparation for safe transition and integration back into school, work, and the community. Plan to address topic of stress management in next OT group session.  Plan - 04/18/21 0752     Occupational performance deficits (Please refer to evaluation for details): ADL's;IADL's;Rest and Sleep;Education;Work;Leisure;Social Participation    Body Structure / Function / Physical Skills ADL    Cognitive Skills Attention;Emotional;Energy/Drive;Learn;Memory;Perception;Problem Solve;Safety Awareness;Thought;Understand;Temperament/Personality    Psychosocial Skills Coping Strategies;Environmental  Adaptations;Habits;Interpersonal Interaction;Routines and Behaviors             Patient will benefit from skilled therapeutic intervention in order to improve the following deficits and impairments:   Body Structure / Function / Physical Skills: ADL Cognitive Skills: Attention, Emotional, Energy/Drive, Learn, Memory, Perception, Problem Solve, Safety Awareness, Thought, Understand, Temperament/Personality Psychosocial Skills: Coping Strategies, Environmental  Adaptations, Habits, Interpersonal Interaction, Routines and Behaviors   Visit Diagnosis: Difficulty coping  Frontal lobe and executive function deficit  MDD (major depressive disorder), recurrent episode, moderate (HCC)    Problem List Patient Active Problem List   Diagnosis Date Noted   Educated about management of weight 12/04/2020   Vitamin D deficiency 07/17/2020   Mixed hyperlipidemia 07/17/2020   Epidermal cyst of vulva 05/29/2020   Itching in the vaginal area 05/29/2020    Gums, bleeding 02/21/2020   Screening for colorectal cancer 11/29/2019   Encounter for gynecological examination with Papanicolaou smear of cervix 11/29/2019   Morbid obesity (HCC) 11/29/2019   PMS (premenstrual syndrome) 11/14/2019   Dysmenorrhea 11/14/2019   Menorrhagia with regular cycle 11/14/2019   Breast nodule 01/02/2015   Morbid obesity with BMI of 60.0-69.9, adult (HCC) 09/21/2014   Obesity, BMI unknown 05/04/2014   ASCUS with positive high risk HPV 02/22/2014   Contraception management 01/06/2013   Recurrent UTI 01/06/2013   Hypothyroidism 11/01/2009   ANXIETY DEPRESSION 11/01/2009   ALLERGIC ASTHMA 11/01/2009   HEMATOCHEZIA 11/01/2009   LATEX ALLERGY 11/01/2009   PERSONAL HX OTH ALLERG OTH THAN MEDICINAL AGTS 11/01/2009    04/18/2021  Donne Hazel, MOT, OTR/L  04/18/2021, 7:52 AM   [facility] [address] [place], [place],  [address] Phone: [phone]   Fax:  [phone]  Name: [name] MRN: [id number] Date of Birth: 1976-06-06

## 2021-04-19 ENCOUNTER — Encounter (HOSPITAL_COMMUNITY): Payer: Self-pay

## 2021-04-19 ENCOUNTER — Other Ambulatory Visit (HOSPITAL_COMMUNITY): Payer: BC Managed Care – PPO | Admitting: Licensed Clinical Social Worker

## 2021-04-19 ENCOUNTER — Other Ambulatory Visit: Payer: Self-pay

## 2021-04-19 ENCOUNTER — Other Ambulatory Visit (HOSPITAL_COMMUNITY): Payer: BC Managed Care – PPO | Admitting: Occupational Therapy

## 2021-04-19 DIAGNOSIS — F411 Generalized anxiety disorder: Secondary | ICD-10-CM | POA: Diagnosis not present

## 2021-04-19 DIAGNOSIS — F331 Major depressive disorder, recurrent, moderate: Secondary | ICD-10-CM | POA: Diagnosis not present

## 2021-04-19 DIAGNOSIS — R41844 Frontal lobe and executive function deficit: Secondary | ICD-10-CM

## 2021-04-19 DIAGNOSIS — F332 Major depressive disorder, recurrent severe without psychotic features: Secondary | ICD-10-CM

## 2021-04-19 DIAGNOSIS — R4589 Other symptoms and signs involving emotional state: Secondary | ICD-10-CM

## 2021-04-19 NOTE — Therapy (Signed)
[facility] PARTIAL HOSPITALIZATION PROGRAM [address] [place], [place], [address] Phone: [phone]   Fax:  [phone] Virtual Visit via Video Note  I connected with [name] on 04/19/21 at  11:00 AM EDT by a video enabled telemedicine application and verified that I am speaking with the correct person using two identifiers.  Location: Patient: Patient Home  Provider: Clinic Office    I discussed the limitations of evaluation and management by telemedicine and the availability of in person appointments. The patient expressed understanding and agreed to proceed.   I discussed the assessment and treatment plan with the patient. The patient was provided an opportunity to ask questions and all were answered. The patient agreed with the plan and demonstrated an understanding of the instructions.   The patient was advised to call back or seek an in-person evaluation if the symptoms worsen or if the condition fails to improve as anticipated.  I provided 60 minutes of non-face-to-face time during this encounter.   Donne Hazel, OT   Occupational Therapy Treatment  Patient Details  Name: [name] MRN: [id number] Date of Birth: 1976/02/13 No data recorded  Encounter Date: 04/19/2021   OT End of Session - 04/19/21 1447     Visit Number 4    Number of Visits 20    Date for OT Re-Evaluation 05/09/21    Authorization Type BCBS    Authorization - Number of Visits 17    OT Start Time 1115    OT Stop Time 1215    OT Time Calculation (min) 60 min    Activity Tolerance Patient tolerated treatment well    Behavior During Therapy [facility] for tasks assessed/performed             Past Medical History:  Diagnosis Date   Allergy    Anxiety    Arthritis    Asthma    Breast nodule 01/02/2015   Depression    Fatigue    GERD (gastroesophageal reflux disease)    Herpes    HPV in female    Obesity    PMDD (premenstrual dysphoric disorder)     Thyroid disease    hypothryoidism   Trauma    Vaginal Pap smear, abnormal    Vitamin D deficiency     Past Surgical History:  Procedure Laterality Date   CESAREAN SECTION     CHOLECYSTECTOMY     TONSILECTOMY/ADENOIDECTOMY WITH MYRINGOTOMY     WISDOM TOOTH EXTRACTION      There were no vitals filed for this visit.   Subjective Assessment - 04/19/21 1446     Currently in Pain? No/denies              OT Education - 04/19/21 1446     Education Details Educated on concept of sensory modulation and self-soothing as coping strategies through use of the eight senses    Person(s) Educated Patient    Methods Explanation;Handout    Comprehension Verbalized understanding              OT Short Term Goals - 04/16/21 1427       OT SHORT TERM GOAL #1   Status On-going      OT SHORT TERM GOAL #2   Status On-going      OT SHORT TERM GOAL #3   Status On-going           Group Session:  S: "My go to hype song is Lose It by Eminem."  O: Today's group session focused on topic of sensory modulation and self-soothing through use of the 8 senses. Discussion introduced the concept of sensory modulation and integration, focusing on how we can utilize our body and it's senses to self-soothe or cope, when we are experiencing an over or under-whelming sensation or feeling. Group members were introduced to a sensory diet checklist as a helpful tool/resource that can be utilized to identify what activities and strategies we prefer and do not prefer based upon our response to different stimulus. The concept of alerting vs calming activities was also introduced to understand how to counteract how we are feeling (Example: when we are feeling overwhelmed/stressed, engage in something calming. When we are feeling depressed/low energy, engage in something alerting). Group members engaged actively in discussion sharing their own personal sensory likes/dislikes.     A: Chiyo was active and  independent in her participation of discussion and activity, sharing sensory strategies and preferences that she has used in the past and current. Pt expressed benefit of going over and creating a sensory diet checklist and while present identified several calming activities including swimming, dancing, and shaking my hands out. Pt identified several alerting activities including dancing and listening to hype music "Eminem." Appeared open and receptive to education received on use of sensory strategies as a way to self-soothe.   P: Continue to attend PHP OT group sessions 5x week for 2 weeks to promote daily structure, social engagement, and opportunities to develop and utilize adaptive strategies to maximize functional performance in preparation for safe transition and integration back into school, work, and the community.    Plan - 04/19/21 1447     Occupational performance deficits (Please refer to evaluation for details): ADL's;IADL's;Rest and Sleep;Education;Work;Leisure;Social Participation    Body Structure / Function / Physical Skills ADL    Cognitive Skills Attention;Emotional;Energy/Drive;Learn;Memory;Perception;Problem Solve;Safety Awareness;Thought;Understand;Temperament/Personality    Psychosocial Skills Coping Strategies;Environmental  Adaptations;Habits;Interpersonal Interaction;Routines and Behaviors             Patient will benefit from skilled therapeutic intervention in order to improve the following deficits and impairments:   Body Structure / Function / Physical Skills: ADL Cognitive Skills: Attention, Emotional, Energy/Drive, Learn, Memory, Perception, Problem Solve, Safety Awareness, Thought, Understand, Temperament/Personality Psychosocial Skills: Coping Strategies, Environmental  Adaptations, Habits, Interpersonal Interaction, Routines and Behaviors   Visit Diagnosis: Difficulty coping  Frontal lobe and executive function deficit  Severe episode of recurrent major  depressive disorder, without psychotic features [facility])    Problem List Patient Active Problem List   Diagnosis Date Noted   Educated about management of weight 12/04/2020   Vitamin D deficiency 07/17/2020   Mixed hyperlipidemia 07/17/2020   Epidermal cyst of vulva 05/29/2020   Itching in the vaginal area 05/29/2020   Gums, bleeding 02/21/2020   Screening for colorectal cancer 11/29/2019   Encounter for gynecological examination with Papanicolaou smear of cervix 11/29/2019   Morbid obesity (HCC) 11/29/2019   PMS (premenstrual syndrome) 11/14/2019   Dysmenorrhea 11/14/2019   Menorrhagia with regular cycle 11/14/2019   Breast nodule 01/02/2015   Morbid obesity with BMI of 60.0-69.9, adult (HCC) 09/21/2014   Obesity, BMI unknown 05/04/2014   ASCUS with positive high risk HPV 02/22/2014   Contraception management 01/06/2013   Recurrent UTI 01/06/2013   Hypothyroidism 11/01/2009   ANXIETY DEPRESSION 11/01/2009   ALLERGIC ASTHMA 11/01/2009   HEMATOCHEZIA 11/01/2009   LATEX ALLERGY  11/01/2009   PERSONAL HX OTH ALLERG OTH THAN MEDICINAL AGTS 11/01/2009    04/19/2021  Rodman Pickle  Carlisha Wisler, MOT, OTR/L  04/19/2021, 2:47 PM  [facility] PARTIAL HOSPITALIZATION PROGRAM [address] [place], [place], [address] Phone: [phone]   Fax:  [phone]  Name: [name] MRN: [id number] Date of Birth: 1976/08/08

## 2021-04-22 ENCOUNTER — Other Ambulatory Visit: Payer: Self-pay

## 2021-04-22 ENCOUNTER — Encounter (HOSPITAL_COMMUNITY): Payer: Self-pay

## 2021-04-22 ENCOUNTER — Other Ambulatory Visit (HOSPITAL_COMMUNITY): Payer: BC Managed Care – PPO | Admitting: Licensed Clinical Social Worker

## 2021-04-22 ENCOUNTER — Other Ambulatory Visit (HOSPITAL_COMMUNITY): Payer: BC Managed Care – PPO | Admitting: Occupational Therapy

## 2021-04-22 DIAGNOSIS — F331 Major depressive disorder, recurrent, moderate: Secondary | ICD-10-CM | POA: Diagnosis not present

## 2021-04-22 DIAGNOSIS — R4589 Other symptoms and signs involving emotional state: Secondary | ICD-10-CM

## 2021-04-22 DIAGNOSIS — F332 Major depressive disorder, recurrent severe without psychotic features: Secondary | ICD-10-CM

## 2021-04-22 DIAGNOSIS — F411 Generalized anxiety disorder: Secondary | ICD-10-CM | POA: Diagnosis not present

## 2021-04-22 DIAGNOSIS — R41844 Frontal lobe and executive function deficit: Secondary | ICD-10-CM

## 2021-04-22 NOTE — Therapy (Signed)
[facility] PARTIAL HOSPITALIZATION PROGRAM [address] [place], [place], [address] Phone: [phone]   Fax:  [phone] Virtual Visit via Video Note  I connected with [name] on 04/22/21 at  10:30 AM EDT by a video enabled telemedicine application and verified that I am speaking with the correct person using two identifiers.  Location: Patient: Patient Home Provider: Clinic Office   I discussed the limitations of evaluation and management by telemedicine and the availability of in person appointments. The patient expressed understanding and agreed to proceed.   I discussed the assessment and treatment plan with the patient. The patient was provided an opportunity to ask questions and all were answered. The patient agreed with the plan and demonstrated an understanding of the instructions.   The patient was advised to call back or seek an in-person evaluation if the symptoms worsen or if the condition fails to improve as anticipated.  I provided 60 minutes of non-face-to-face time during this encounter.   Donne Hazel, OT   Occupational Therapy Treatment  Patient Details  Name: [name] MRN: [id number] Date of Birth: [birth year] No data recorded  Encounter Date: 04/22/2021   OT End of Session - 04/22/21 1336     Visit Number 5    Number of Visits 20    Date for OT Re-Evaluation 05/09/21    Authorization Type BCBS    Authorization - Number of Visits 17    OT Start Time 1030    OT Stop Time 1130    OT Time Calculation (min) 60 min    Activity Tolerance Patient tolerated treatment well;Patient limited by fatigue    Behavior During Therapy [facility] for tasks assessed/performed             Past Medical History:  Diagnosis Date   Allergy    Anxiety    Arthritis    Asthma    Breast nodule 01/02/2015   Depression    Fatigue    GERD (gastroesophageal reflux disease)    Herpes    HPV in female    Obesity    PMDD  (premenstrual dysphoric disorder)    Thyroid disease    hypothryoidism   Trauma    Vaginal Pap smear, abnormal    Vitamin D deficiency     Past Surgical History:  Procedure Laterality Date   CESAREAN SECTION     CHOLECYSTECTOMY     TONSILECTOMY/ADENOIDECTOMY WITH MYRINGOTOMY     WISDOM TOOTH EXTRACTION      There were no vitals filed for this visit.   Subjective Assessment - 04/22/21 1335     Currently in Pain? No/denies              OT Education - 04/22/21 1335     Education Details Educated on physical symptomology of stress and its effects on the body, along with positive stress management tips/strategies    Person(s) Educated Patient    Methods Explanation;Handout    Comprehension Verbalized understanding              OT Short Term Goals - 04/16/21 1427       OT SHORT TERM GOAL #1   Status On-going      OT SHORT TERM GOAL #2   Status On-going      OT SHORT TERM GOAL #3   Status On-going           Group Session:  S: "I'm stressed about going back to work"  O:  Group began with a check-in and review of previous OT session focused on relaxation strategies. Today's discussion focused on the topic of stress management. Group members worked collaboratively to create a Microbiologist identifying physical signs, behavioral signs, emotional/psychological, and cognitive signs of stress. Discussion then focused and encouraged group members to identify positive stress management strategies they could utilize in those moments to manage identified signs  A: [name] was active in her participation of discussion and activity, however was intermittently distracted, appearing to fall asleep intermittently. She identified being stressed about her return to work and emphasized that she does not get along with her boss and her boss is not accepting of her absence and belief in mental health. Pt identified mindfulness as a strategy she has used to manage her  stress in the past and was also in agreement with strategies shared by peers.   P: Continue to attend PHP OT group sessions 5x week for 2 weeks to promote daily structure, social engagement, and opportunities to develop and utilize adaptive strategies to maximize functional performance in preparation for safe transition and integration back into school, work, and the community.    Plan - 04/22/21 1336     Occupational performance deficits (Please refer to evaluation for details): ADL's;IADL's;Rest and Sleep;Education;Work;Leisure;Social Participation    Body Structure / Function / Physical Skills ADL    Cognitive Skills Attention;Emotional;Energy/Drive;Learn;Memory;Perception;Problem Solve;Safety Awareness;Thought;Understand;Temperament/Personality    Psychosocial Skills Coping Strategies;Environmental  Adaptations;Habits;Interpersonal Interaction;Routines and Behaviors             Patient will benefit from skilled therapeutic intervention in order to improve the following deficits and impairments:   Body Structure / Function / Physical Skills: ADL Cognitive Skills: Attention, Emotional, Energy/Drive, Learn, Memory, Perception, Problem Solve, Safety Awareness, Thought, Understand, Temperament/Personality Psychosocial Skills: Coping Strategies, Environmental  Adaptations, Habits, Interpersonal Interaction, Routines and Behaviors   Visit Diagnosis: Difficulty coping  Frontal lobe and executive function deficit  Severe episode of recurrent major depressive disorder, without psychotic features [facility])    Problem List Patient Active Problem List   Diagnosis Date Noted   Educated about management of weight 12/04/2020   Vitamin D deficiency 07/17/2020   Mixed hyperlipidemia 07/17/2020   Epidermal cyst of vulva 05/29/2020   Itching in the vaginal area 05/29/2020   Gums, bleeding 02/21/2020   Screening for colorectal cancer 11/29/2019   Encounter for gynecological examination with  Papanicolaou smear of cervix 11/29/2019   Morbid obesity (HCC) 11/29/2019   PMS (premenstrual syndrome) 11/14/2019   Dysmenorrhea 11/14/2019   Menorrhagia with regular cycle 11/14/2019   Breast nodule 01/02/2015   Morbid obesity with BMI of 60.0-69.9, adult (HCC) 09/21/2014   Obesity, BMI unknown 05/04/2014   ASCUS with positive high risk HPV 02/22/2014   Contraception management 01/06/2013   Recurrent UTI 01/06/2013   Hypothyroidism 11/01/2009   ANXIETY DEPRESSION 11/01/2009   ALLERGIC ASTHMA 11/01/2009   HEMATOCHEZIA 11/01/2009   LATEX ALLERGY 11/01/2009   PERSONAL HX OTH ALLERG OTH THAN MEDICINAL AGTS 11/01/2009    04/22/2021  Donne Hazel, MOT, OTR/L  04/22/2021, 1:36 PM  [facility] PARTIAL HOSPITALIZATION PROGRAM [address] [place], [place], [address] Phone: [phone]   Fax:  [phone]  Name: [name] MRN: [id number] Date of Birth: Feb 26, 1976

## 2021-04-23 ENCOUNTER — Telehealth (HOSPITAL_COMMUNITY): Payer: Self-pay | Admitting: Psychiatry

## 2021-04-23 ENCOUNTER — Other Ambulatory Visit (HOSPITAL_COMMUNITY): Payer: BC Managed Care – PPO | Admitting: Occupational Therapy

## 2021-04-23 ENCOUNTER — Encounter (HOSPITAL_COMMUNITY): Payer: Self-pay

## 2021-04-23 ENCOUNTER — Other Ambulatory Visit: Payer: Self-pay

## 2021-04-23 ENCOUNTER — Other Ambulatory Visit (HOSPITAL_COMMUNITY): Payer: BC Managed Care – PPO | Admitting: Licensed Clinical Social Worker

## 2021-04-23 DIAGNOSIS — F332 Major depressive disorder, recurrent severe without psychotic features: Secondary | ICD-10-CM

## 2021-04-23 DIAGNOSIS — F411 Generalized anxiety disorder: Secondary | ICD-10-CM | POA: Diagnosis not present

## 2021-04-23 DIAGNOSIS — R4589 Other symptoms and signs involving emotional state: Secondary | ICD-10-CM

## 2021-04-23 DIAGNOSIS — F331 Major depressive disorder, recurrent, moderate: Secondary | ICD-10-CM

## 2021-04-23 DIAGNOSIS — R41844 Frontal lobe and executive function deficit: Secondary | ICD-10-CM

## 2021-04-23 NOTE — Progress Notes (Signed)
Virtual Visit via Video Note  I connected with [name] on 04/24/21 at  9:00 AM EDT by a video enabled telemedicine application and verified that I am speaking with the correct person using two identifiers.  Location: Patient: Home Provider: Office   I discussed the limitations of evaluation and management by telemedicine and the availability of in person appointments. The patient expressed understanding and agreed to proceed.      I discussed the assessment and treatment plan with the patient. The patient was provided an opportunity to ask questions and all were answered. The patient agreed with the plan and demonstrated an understanding of the instructions.   The patient was advised to call back or seek an in-person evaluation if the symptoms worsen or if the condition fails to improve as anticipated.  I provided 15 minutes of non-face-to-face time during this encounter.   Anita Rack, NP   [facility] MD/PA/NP OP Progress Note  04/24/2021 9:45 AM [name]  MRN:  [id number]   [name] was seen and evaluated via WebEx.  She presents flat, guarded but pleasant.  Reports her depression 7 out of 10 with 10 being the worst.  States a recent follow-up appointment with attending psychiatrist with the medication adjustment to her Geodon.  Currently she is  prescribed 40 mg BID. she is able to afford medications.  She reports taking and tolerating medications well.    [name] reports fluctuation with in her appetite and sleep.  States she continues to ruminate and worry about her mother declining health. She is denying suicidal or homicidal ideations.  Denies auditory or visual hallucinations.  Continues to work on ADLs and improving her self motivation and self-esteem.  Patient to continue daily group sessions. Support ,encouragement  and reassurance was provided.  Visit Diagnosis:    ICD-10-CM   1. MDD (major depressive disorder), recurrent episode, moderate (HCC)  F33.1     2.  Severe episode of recurrent major depressive disorder, without psychotic features (HCC)  F33.2       Past Psychiatric History:   Past Medical History:  Past Medical History:  Diagnosis Date   Allergy    Anxiety    Arthritis    Asthma    Breast nodule 01/02/2015   Depression    Fatigue    GERD (gastroesophageal reflux disease)    Herpes    HPV in female    Obesity    PMDD (premenstrual dysphoric disorder)    Thyroid disease    hypothryoidism   Trauma    Vaginal Pap smear, abnormal    Vitamin D deficiency     Past Surgical History:  Procedure Laterality Date   CESAREAN SECTION     CHOLECYSTECTOMY     TONSILECTOMY/ADENOIDECTOMY WITH MYRINGOTOMY     WISDOM TOOTH EXTRACTION      Family Psychiatric History:   Family History:  Family History  Problem Relation Age of Onset   Hypertension Mother    Arthritis Mother    Anxiety disorder Mother    Asthma Father    COPD Father    Arthritis Father    Chronic bronchitis Father    Hypertension Father    Hyperlipidemia Father    Anxiety disorder Father    Depression Father    Alcohol abuse Father    Drug abuse Brother    Alcohol abuse Brother    Hypertension Brother    Mental illness Brother    Depression Brother    Cancer Maternal Aunt  Cancer Maternal Uncle    Cancer Paternal Aunt    Cancer Paternal Uncle    Asthma Maternal Grandmother    Arthritis Maternal Grandmother    Hypertension Maternal Grandmother    Congestive Heart Failure Maternal Grandmother    Diabetes Maternal Grandfather    Stroke Maternal Grandfather    ADD / ADHD Son    Hyperlipidemia Brother    Hearing loss Brother    Asthma Brother    Learning disabilities Brother     Social History:  Social History   Socioeconomic History   Marital status: Married    Spouse name: Not on file   Number of children: 2   Years of education: Not on file   Highest education level: Associate degree: occupational, Scientist, product/process development, or vocational program   Occupational History   Not on file  Tobacco Use   Smoking status: Former    Pack years: 0.00   Smokeless tobacco: Never   Tobacco comments:    light smoker as a teenager   Building services engineer Use: Never used  Substance and Sexual Activity   Alcohol use: Yes    Comment: occ   Drug use: No   Sexual activity: Yes    Birth control/protection: Pill  Other Topics Concern   Not on file  Social History Narrative   Social Hx:   Current living situation- living in [place] with husband, son is 50/50 custody with biological father, daughter lives with pt's mom in [place]. Pt lives close to her mom   Raised in [place] by mom and dad   Siblings- 2 brothers and pt is the youngest. She is 31 yrs younger than her youngest brother   Schooling- associates degree in early childhood   Married- yes - currently in 3rd marriage   Kids- 2 from 1st marriage      Legal issues- denies   Social Determinants of Corporate investment banker Strain: Not on file  Food Insecurity: Not on file  Transportation Needs: Not on file  Physical Activity: Not on file  Stress: Not on file  Social Connections: Not on file    Allergies:  Allergies  Allergen Reactions   Latex    Naproxen    Shrimp [Shellfish Allergy] Other (See Comments)    Intolerance     Metabolic Disorder Labs: No results found for: HGBA1C, MPG No results found for: PROLACTIN Lab Results  Component Value Date   CHOL 193 05/25/2020   TRIG 102 05/25/2020   HDL 42 05/25/2020   LDLCALC 100 05/25/2020   Lab Results  Component Value Date   TSH 1.460 11/27/2020   TSH 2.740 07/19/2020    Therapeutic Level Labs: No results found for: LITHIUM No results found for: VALPROATE No components found for:  CBMZ  Current Medications: Current Outpatient Medications  Medication Sig Dispense Refill   ALPRAZolam (XANAX) 0.5 MG tablet Take 1 tablet (0.5 mg total) by mouth 2 (two) times daily as needed for anxiety. 60 tablet 0    augmented betamethasone dipropionate (DIPROLENE-AF) 0.05 % cream      busPIRone (BUSPAR) 10 MG tablet Take 2 tablets (20 mg total) by mouth 2 (two) times daily. 360 tablet 0   calcium-vitamin D (OSCAL WITH D) 500-200 MG-UNIT tablet Take 1 tablet by mouth.     cetirizine (ZYRTEC) 10 MG tablet Take 10 mg by mouth daily.     ibuprofen (ADVIL) 600 MG tablet Take 600 mg by mouth 3 (three) times daily. As needed  levalbuterol (XOPENEX HFA) 45 MCG/ACT inhaler Inhale into the lungs every 4 (four) hours as needed for wheezing.     levothyroxine (SYNTHROID) 200 MCG tablet TAKE 1 TABLET(200 MCG) BY MOUTH DAILY BEFORE AND BREAKFAST 90 tablet 1   levothyroxine (SYNTHROID) 75 MCG tablet TAKE 1 TABLET(75 MCG) BY MOUTH DAILY 90 tablet 0   MELATONIN PO Take by mouth at bedtime.     Misc Natural Products (CRANBERRY/PROBIOTIC PO) Take by mouth daily. Cranberry/Probiotic/Prebiotic Blend-2 daily     montelukast (SINGULAIR) 10 MG tablet Take 10 mg by mouth daily.      Multiple Vitamins-Minerals (WOMENS MULTI VITAMIN & MINERAL PO) Take by mouth daily.     norethindrone (AYGESTIN) 5 MG tablet TAKE 2 TABLETS BY MOUTH DAILY 60 tablet 3   venlafaxine XR (EFFEXOR-XR) 75 MG 24 hr capsule Take 3 capsules (225 mg total) by mouth daily with breakfast. 270 capsule 0   ziprasidone (GEODON) 40 MG capsule Take 1 capsule (40 mg total) by mouth 2 (two) times daily with a meal. 60 capsule 1   No current facility-administered medications for this visit.     Musculoskeletal: Strength & Muscle Tone: within normal limits Gait & Station: normal Patient leans: N/A  Psychiatric Specialty Exam: Review of Systems  There were no vitals taken for this visit.There is no height or weight on file to calculate BMI.  General Appearance: Casual  Eye Contact:  Good  Speech:  Clear and Coherent  Volume:  Normal  Mood:  Anxious and Depressed  Affect:  Congruent  Thought Process:  Coherent  Orientation:  Full (Time, Place, and Person)   Thought Content: Logical   Suicidal Thoughts:  No  Homicidal Thoughts:  No  Memory:  Immediate;   Fair Recent;   Fair  Judgement:  Fair  Insight:  Fair  Psychomotor Activity:  Normal  Concentration:  Concentration: Fair  Recall:  Good  Fund of Knowledge: Good  Language: Good  Akathisia:  No  Handed:  Right  AIMS (if indicated): done  Assets:  Communication Skills Resilience Social Support  ADL's:  Intact  Cognition: WNL  Sleep:  Good   Screenings: GAD-7    Flowsheet Row Counselor from 11/26/2020 in [facility] Counselor from 03/02/2020 in [facility]  Total GAD-7 Score 9 15      PHQ2-9    Flowsheet Row Video Visit from 04/18/2021 in [facility] Counselor from 04/17/2021 in [facility] Video Visit from 04/04/2021 in [facility] Video Visit from 02/07/2021 in [facility] Counselor from 11/26/2020 in [facility]  PHQ-2 Total Score 5 6 6 2 2   PHQ-9 Total Score 18 22 23 12 7       Flowsheet Row Video Visit from 04/18/2021 in [facility] Counselor from 04/17/2021 in [facility] Video Visit from 04/04/2021 in [facility]  C-SSRS RISK CATEGORY Error: Q3, 4, or 5 should not be populated when Q2 is No Error: Q3, 4, or 5 should not be populated when Q2 is No Error: Q3, 4, or 5 should not be populated when Q2 is No        Assessment and Plan:  Continue with partial hospitalization programming Continue medications as directed   Treatment plan was reviewed and agreed upon by NP 06/18/2021 and patient Jlyn Cerros need for continued group services   Emillee Talsma  Charma IgoN Rayhaan Huster, NP 04/24/2021, 9:45 AM

## 2021-04-23 NOTE — Therapy (Signed)
[name] [facility] PARTIAL HOSPITALIZATION PROGRAM [address] [place], [place], [address] Phone: [phone]   Fax:  [phone] Virtual Visit via Video Note  I connected with [name] on 04/23/21 at  11:00 AM EDT by a video enabled telemedicine application and verified that I am speaking with the correct person using two identifiers.  Location: Patient: Patient Home Provider: Clinic Office   I discussed the limitations of evaluation and management by telemedicine and the availability of in person appointments. The patient expressed understanding and agreed to proceed.   I discussed the assessment and treatment plan with the patient. The patient was provided an opportunity to ask questions and all were answered. The patient agreed with the plan and demonstrated an understanding of the instructions.   The patient was advised to call back or seek an in-person evaluation if the symptoms worsen or if the condition fails to improve as anticipated.  I provided 60 minutes of non-face-to-face time during this encounter.   [name], OT   Occupational Therapy Treatment  Patient Details  Name: [name] MRN: [id number] Date of Birth: [birth year] No data recorded  Encounter Date: 04/23/2021   OT End of Session - 04/23/21 1621     Visit Number 6    Number of Visits 20    Date for OT Re-Evaluation 05/09/21    Authorization Type BCBS    Authorization - Number of Visits 17    OT Start Time 1100    OT Stop Time 1200    OT Time Calculation (min) 60 min    Activity Tolerance Patient tolerated treatment well;Patient limited by fatigue    Behavior During Therapy [facility] for tasks assessed/performed             Past Medical History:  Diagnosis Date   Allergy    Anxiety    Arthritis    Asthma    Breast nodule 01/02/2015   Depression    Fatigue    GERD (gastroesophageal reflux disease)    Herpes    HPV in female    Obesity    PMDD  (premenstrual dysphoric disorder)    Thyroid disease    hypothryoidism   Trauma    Vaginal Pap smear, abnormal    Vitamin D deficiency     Past Surgical History:  Procedure Laterality Date   CESAREAN SECTION     CHOLECYSTECTOMY     TONSILECTOMY/ADENOIDECTOMY WITH MYRINGOTOMY     WISDOM TOOTH EXTRACTION      There were no vitals filed for this visit.   Subjective Assessment - 04/23/21 1621     Currently in Pain? No/denies             OT Education - 04/23/21 1621     Education Details Educated on different factors that contribute to our ability to manage our time, along with specific time management tips/strategies, including procrastination    Person(s) Educated Patient    Methods Explanation;Handout    Comprehension Verbalized understanding              OT Short Term Goals - 04/16/21 1427       OT SHORT TERM GOAL #1   Status On-going      OT SHORT TERM GOAL #2   Status On-going      OT SHORT TERM GOAL #3   Status On-going           Group Session:  S: None noted*  O: Group began  with a warm-up ice breaker activity where patients were encouraged to reflect on the scenario "If you had 86,400 dollars dropped into your bank account at midnight and 24 hours to spend it, what you use the money for? The only rule was that any money not used disappeared after 24 hours." Warm-up activity was used as an Web designer and a way to connect that similar to the scenario of money, we do not get time back and there are 86,400 seconds in a day. Warm-up transitioned into today's group focused on topic of Time Management. Group members identified ways in which they currently struggle with managing their time and discussion focused on alternative strategies to recognize how we can be more productive and intentional with our time and managing it appropriately. Group members also discussed specific strategies to overcome procrastination.   A: [name] was largely an observer during  today's group session, appeared fatigued and did not verbally contribute to discussion. Will continue to encourage active engagement and participation in future sessions.   P: Continue to attend PHP OT group sessions 5x week for 2 weeks to promote daily structure, social engagement, and opportunities to develop and utilize adaptive strategies to maximize functional performance in preparation for safe transition and integration back into school, work, and the community. Plan to address topic of time management and procrastination in next OT group session.  Plan - 04/23/21 1621     Occupational performance deficits (Please refer to evaluation for details): ADL's;IADL's;Rest and Sleep;Education;Work;Leisure;Social Participation    Body Structure / Function / Physical Skills ADL    Cognitive Skills Attention;Emotional;Energy/Drive;Learn;Memory;Perception;Problem Solve;Safety Awareness;Thought;Understand;Temperament/Personality    Psychosocial Skills Coping Strategies;Environmental  Adaptations;Habits;Interpersonal Interaction;Routines and Behaviors             Patient will benefit from skilled therapeutic intervention in order to improve the following deficits and impairments:   Body Structure / Function / Physical Skills: ADL Cognitive Skills: Attention, Emotional, Energy/Drive, Learn, Memory, Perception, Problem Solve, Safety Awareness, Thought, Understand, Temperament/Personality Psychosocial Skills: Coping Strategies, Environmental  Adaptations, Habits, Interpersonal Interaction, Routines and Behaviors   Visit Diagnosis: Difficulty coping  Frontal lobe and executive function deficit  Severe episode of recurrent major depressive disorder, without psychotic features [facility])    Problem List Patient Active Problem List   Diagnosis Date Noted   Educated about management of weight 12/04/2020   Vitamin D deficiency 07/17/2020   Mixed hyperlipidemia 07/17/2020   Epidermal cyst of vulva  05/29/2020   Itching in the vaginal area 05/29/2020   Gums, bleeding 02/21/2020   Screening for colorectal cancer 11/29/2019   Encounter for gynecological examination with Papanicolaou smear of cervix 11/29/2019   Morbid obesity (HCC) 11/29/2019   PMS (premenstrual syndrome) 11/14/2019   Dysmenorrhea 11/14/2019   Menorrhagia with regular cycle 11/14/2019   Breast nodule 01/02/2015   Morbid obesity with BMI of 60.0-69.9, adult (HCC) 09/21/2014   Obesity, BMI unknown 05/04/2014   ASCUS with positive high risk HPV 02/22/2014   Contraception management 01/06/2013   Recurrent UTI 01/06/2013   Hypothyroidism 11/01/2009   ANXIETY DEPRESSION 11/01/2009   ALLERGIC ASTHMA 11/01/2009   HEMATOCHEZIA 11/01/2009   LATEX ALLERGY  11/01/2009   PERSONAL HX OTH ALLERG OTH THAN MEDICINAL AGTS 11/01/2009    04/23/2021  [name], MOT, OTR/L 04/23/2021, 4:22 PM  [facility] PARTIAL HOSPITALIZATION PROGRAM [address] [place], [place], [address] Phone: [phone]   Fax:  [phone]  Name: [name] MRN: [id number] Date of Birth: 1976/10/08

## 2021-04-23 NOTE — Telephone Encounter (Signed)
D:  Attempted to call and orient pt to MH-IOP but there was no answer and her vm was full.  Pt will transition to MH-IOP from PHP on 05-01-21.

## 2021-04-24 ENCOUNTER — Other Ambulatory Visit (HOSPITAL_COMMUNITY): Payer: BC Managed Care – PPO

## 2021-04-24 ENCOUNTER — Other Ambulatory Visit: Payer: Self-pay

## 2021-04-24 ENCOUNTER — Encounter (HOSPITAL_COMMUNITY): Payer: Self-pay | Admitting: Family

## 2021-04-24 ENCOUNTER — Other Ambulatory Visit (HOSPITAL_COMMUNITY): Payer: BC Managed Care – PPO | Admitting: Licensed Clinical Social Worker

## 2021-04-24 DIAGNOSIS — F332 Major depressive disorder, recurrent severe without psychotic features: Secondary | ICD-10-CM

## 2021-04-24 DIAGNOSIS — F411 Generalized anxiety disorder: Secondary | ICD-10-CM

## 2021-04-25 ENCOUNTER — Encounter (HOSPITAL_COMMUNITY): Payer: Self-pay

## 2021-04-25 ENCOUNTER — Other Ambulatory Visit (HOSPITAL_COMMUNITY): Payer: BC Managed Care – PPO | Admitting: Occupational Therapy

## 2021-04-25 ENCOUNTER — Other Ambulatory Visit (HOSPITAL_COMMUNITY): Payer: BC Managed Care – PPO | Admitting: Licensed Clinical Social Worker

## 2021-04-25 DIAGNOSIS — R41844 Frontal lobe and executive function deficit: Secondary | ICD-10-CM

## 2021-04-25 DIAGNOSIS — F411 Generalized anxiety disorder: Secondary | ICD-10-CM | POA: Diagnosis not present

## 2021-04-25 DIAGNOSIS — F331 Major depressive disorder, recurrent, moderate: Secondary | ICD-10-CM | POA: Diagnosis not present

## 2021-04-25 DIAGNOSIS — F332 Major depressive disorder, recurrent severe without psychotic features: Secondary | ICD-10-CM

## 2021-04-25 DIAGNOSIS — R4589 Other symptoms and signs involving emotional state: Secondary | ICD-10-CM

## 2021-04-25 NOTE — Progress Notes (Signed)
Spoke with patient via Webex video call, used 2 identifiers to correctly identify patient. States that groups are going well. She is waiting on her insurance to approve a sleep study and is hoping it will help her depression and daytime sleepiness. On scale 1-10 as 10 being worst she rates depression at 6 and anxiety at 5. Denies SI/HI or AV hallucinations. No side effects from medications. No issues or complaints.

## 2021-04-25 NOTE — Therapy (Signed)
[facility] PARTIAL HOSPITALIZATION PROGRAM [address] [place], [place], [address] Phone: [phone]   Fax:  [phone] Virtual Visit via Video Note  I connected with [name] on 04/25/21 at  11:00 AM EDT by a video enabled telemedicine application and verified that I am speaking with the correct person using two identifiers.  Location: Patient: Patient Home Provider: Clinic Office   I discussed the limitations of evaluation and management by telemedicine and the availability of in person appointments. The patient expressed understanding and agreed to proceed.   I discussed the assessment and treatment plan with the patient. The patient was provided an opportunity to ask questions and all were answered. The patient agreed with the plan and demonstrated an understanding of the instructions.   The patient was advised to call back or seek an in-person evaluation if the symptoms worsen or if the condition fails to improve as anticipated.  I provided 60 minutes of non-face-to-face time during this encounter.   Donne Hazel, OT   Occupational Therapy Treatment  Patient Details  Name: [name] MRN: [id number] Date of Birth: 02/03/1976 No data recorded  Encounter Date: 04/25/2021   OT End of Session - 04/25/21 1308     Visit Number 7    Number of Visits 20    Date for OT Re-Evaluation 05/09/21    Authorization Type BCBS    Authorization - Number of Visits 17    OT Start Time 1115    OT Stop Time 1215    OT Time Calculation (min) 60 min    Activity Tolerance Patient tolerated treatment well;Patient limited by fatigue    Behavior During Therapy [facility] for tasks assessed/performed             Past Medical History:  Diagnosis Date   Allergy    Anxiety    Arthritis    Asthma    Breast nodule 01/02/2015   Depression    Fatigue    GERD (gastroesophageal reflux disease)    Herpes    HPV in female    Obesity    PMDD  (premenstrual dysphoric disorder)    Thyroid disease    hypothryoidism   Trauma    Vaginal Pap smear, abnormal    Vitamin D deficiency     Past Surgical History:  Procedure Laterality Date   CESAREAN SECTION     CHOLECYSTECTOMY     TONSILECTOMY/ADENOIDECTOMY WITH MYRINGOTOMY     WISDOM TOOTH EXTRACTION      There were no vitals filed for this visit.   Subjective Assessment - 04/25/21 1307     Currently in Pain? No/denies             OT Education - 04/25/21 1307     Education Details Educated on fight-or-flight response and use of relaxation strategies including deep breathing, guided imagery, and PMR    Person(s) Educated Patient    Methods Explanation;Handout    Comprehension Verbalized understanding              OT Short Term Goals - 04/16/21 1427       OT SHORT TERM GOAL #1   Status On-going      OT SHORT TERM GOAL #2   Status On-going      OT SHORT TERM GOAL #3   Status On-going           Group Session:  S: "I like swimming and petting my dogs to help calm me  down."  O:  Group began with a warm-up activity and group members were encouraged to share and identify ways in which they have practiced relaxation in the past, including any specific strategies or hobbies. Today's discussion focused on the topic of RELAXATION and patients reviewed and engaged in a variety of relaxation strategies techniques including deep breathing, guided imagery/meditation, and progressive muscle relaxation. After each strategy was reviewed, group members were invited to engage in an active practice of relaxation strategies identified. Review and background on the fight-or-flight response was also provided.   A: [name] was active and independent in her participation of discussion, however continues to present fatigued and falling asleep intermittently during session. Pt shared that currently she likes to relax by swimming and petting her dogs to calm her down. Pt also offered  strategies to peers and appeared receptive to strategies and education provided by clinician.   P: Continue to attend PHP OT group sessions 5x week for 2 weeks to promote daily structure, social engagement, and opportunities to develop and utilize adaptive strategies to maximize functional performance in preparation for safe transition and integration back into school, work, and the community.   Plan - 04/25/21 1308     Occupational performance deficits (Please refer to evaluation for details): ADL's;IADL's;Rest and Sleep;Education;Work;Leisure;Social Participation    Body Structure / Function / Physical Skills ADL    Cognitive Skills Attention;Emotional;Energy/Drive;Learn;Memory;Perception;Problem Solve;Safety Awareness;Thought;Understand;Temperament/Personality    Psychosocial Skills Coping Strategies;Environmental  Adaptations;Habits;Interpersonal Interaction;Routines and Behaviors             Patient will benefit from skilled therapeutic intervention in order to improve the following deficits and impairments:   Body Structure / Function / Physical Skills: ADL Cognitive Skills: Attention, Emotional, Energy/Drive, Learn, Memory, Perception, Problem Solve, Safety Awareness, Thought, Understand, Temperament/Personality Psychosocial Skills: Coping Strategies, Environmental  Adaptations, Habits, Interpersonal Interaction, Routines and Behaviors   Visit Diagnosis: Difficulty coping  Frontal lobe and executive function deficit  Severe episode of recurrent major depressive disorder, without psychotic features [facility])    Problem List Patient Active Problem List   Diagnosis Date Noted   Educated about management of weight 12/04/2020   Vitamin D deficiency 07/17/2020   Mixed hyperlipidemia 07/17/2020   Epidermal cyst of vulva 05/29/2020   Itching in the vaginal area 05/29/2020   Gums, bleeding 02/21/2020   Screening for colorectal cancer 11/29/2019   Encounter for gynecological  examination with Papanicolaou smear of cervix 11/29/2019   Morbid obesity (HCC) 11/29/2019   PMS (premenstrual syndrome) 11/14/2019   Dysmenorrhea 11/14/2019   Menorrhagia with regular cycle 11/14/2019   Breast nodule 01/02/2015   Morbid obesity with BMI of 60.0-69.9, adult (HCC) 09/21/2014   Obesity, BMI unknown 05/04/2014   ASCUS with positive high risk HPV 02/22/2014   Contraception management 01/06/2013   Recurrent UTI 01/06/2013   Hypothyroidism 11/01/2009   ANXIETY DEPRESSION 11/01/2009   ALLERGIC ASTHMA 11/01/2009   HEMATOCHEZIA 11/01/2009   LATEX ALLERGY  11/01/2009   PERSONAL HX OTH ALLERG OTH THAN MEDICINAL AGTS 11/01/2009    04/25/2021  Donne Hazel, MOT, OTR/L  04/25/2021, 1:10 PM  [facility] PARTIAL HOSPITALIZATION PROGRAM [address] [place], [place], [address] Phone: [phone]   Fax:  [phone]  Name: [name] MRN: [id number] Date of Birth: [birth year]

## 2021-04-26 ENCOUNTER — Encounter (HOSPITAL_COMMUNITY): Payer: Self-pay

## 2021-04-26 ENCOUNTER — Other Ambulatory Visit (HOSPITAL_COMMUNITY): Payer: BC Managed Care – PPO | Admitting: Licensed Clinical Social Worker

## 2021-04-26 ENCOUNTER — Other Ambulatory Visit: Payer: Self-pay

## 2021-04-26 ENCOUNTER — Other Ambulatory Visit (HOSPITAL_COMMUNITY): Payer: BC Managed Care – PPO | Admitting: Occupational Therapy

## 2021-04-26 DIAGNOSIS — F332 Major depressive disorder, recurrent severe without psychotic features: Secondary | ICD-10-CM

## 2021-04-26 DIAGNOSIS — F411 Generalized anxiety disorder: Secondary | ICD-10-CM | POA: Diagnosis not present

## 2021-04-26 DIAGNOSIS — R41844 Frontal lobe and executive function deficit: Secondary | ICD-10-CM

## 2021-04-26 DIAGNOSIS — F331 Major depressive disorder, recurrent, moderate: Secondary | ICD-10-CM | POA: Diagnosis not present

## 2021-04-26 DIAGNOSIS — R4589 Other symptoms and signs involving emotional state: Secondary | ICD-10-CM

## 2021-04-26 NOTE — Therapy (Signed)
[facility] PARTIAL HOSPITALIZATION PROGRAM [address] [place], [place], [address] Phone: [phone]   Fax:  [phone] Virtual Visit via Video Note  I connected with [name] on 04/26/21 at  11:00 AM EDT by a video enabled telemedicine application and verified that I am speaking with the correct person using two identifiers.  Location: Patient: Patient Home Provider: Clinic Office   I discussed the limitations of evaluation and management by telemedicine and the availability of in person appointments. The patient expressed understanding and agreed to proceed.   I discussed the assessment and treatment plan with the patient. The patient was provided an opportunity to ask questions and all were answered. The patient agreed with the plan and demonstrated an understanding of the instructions.   The patient was advised to call back or seek an in-person evaluation if the symptoms worsen or if the condition fails to improve as anticipated.  I provided 65 minutes of non-face-to-face time during this encounter.   Donne Hazel, OT   Occupational Therapy Treatment  Patient Details  Name: [name] MRN: [id number] Date of Birth: 04-11-1976 No data recorded  Encounter Date: 04/26/2021   OT End of Session - 04/26/21 1410     Visit Number 8    Number of Visits 20    Date for OT Re-Evaluation 05/09/21    Authorization Type [employer]    Authorization - Number of Visits 17    OT Start Time 1115    OT Stop Time 1220    OT Time Calculation (min) 65 min    Activity Tolerance Patient tolerated treatment well    Behavior During Therapy [facility] for tasks assessed/performed             Past Medical History:  Diagnosis Date   Allergy    Anxiety    Arthritis    Asthma    Breast nodule 01/02/2015   Depression    Fatigue    GERD (gastroesophageal reflux disease)    Herpes    HPV in female    Obesity    PMDD (premenstrual dysphoric disorder)     Thyroid disease    hypothryoidism   Trauma    Vaginal Pap smear, abnormal    Vitamin D deficiency     Past Surgical History:  Procedure Laterality Date   CESAREAN SECTION     CHOLECYSTECTOMY     TONSILECTOMY/ADENOIDECTOMY WITH MYRINGOTOMY     WISDOM TOOTH EXTRACTION      There were no vitals filed for this visit.   Subjective Assessment - 04/26/21 1409     Currently in Pain? No/denies             OT Education - 04/26/21 1409     Education Details Educated on brain fitness and healthy distractions as use for positive coping    Person(s) Educated Patient    Methods Explanation;Handout    Comprehension Verbalized understanding              OT Short Term Goals - 04/16/21 1427       OT SHORT TERM GOAL #1   Status On-going      OT SHORT TERM GOAL #2   Status On-going      OT SHORT TERM GOAL #3   Status On-going           Group Session:  S: "I'm not really into puzzles and things because I'm not very good at them."  O: Group encouraged  increased social engagement and participation through discussion/activity focused on brain fitness. Patients were provided education on various brain fitness activities/strategies, with explanation provided on the qualifying factors including: one, that is has to be challenging/hard and two, it has to be something that you do not do every day. Patients engaged actively during group session in various brain fitness activities to increase attention, concentration, and problem-solving skills. Discussion followed with a focus on identifying the benefits of brain fitness activities as use for adaptive coping strategies and distraction.    A: [name] was active in their participation of group discussion and activity, sharing that she does not really engage in brain fitness activities, however was open to trying them out during group. Pt was an active participant and was observed smiling, laughing and engaging with peers. Pt validated  and expressed that activities presented were distracting and noted benefit of improved mood.   P: Continue to attend PHP OT group sessions 5x week for 1 weeks to promote daily structure, social engagement, and opportunities to develop and utilize adaptive strategies to maximize functional performance in preparation for safe transition and integration back into school, work, and the community. Plan to address topic of goal-setting in next OT group session.  Plan - 04/26/21 1410     Occupational performance deficits (Please refer to evaluation for details): ADL's;IADL's;Rest and Sleep;Education;Work;Leisure;Social Participation    Body Structure / Function / Physical Skills ADL    Cognitive Skills Attention;Emotional;Energy/Drive;Learn;Memory;Perception;Problem Solve;Safety Awareness;Thought;Understand;Temperament/Personality    Psychosocial Skills Coping Strategies;Environmental  Adaptations;Habits;Interpersonal Interaction;Routines and Behaviors             Patient will benefit from skilled therapeutic intervention in order to improve the following deficits and impairments:   Body Structure / Function / Physical Skills: ADL Cognitive Skills: Attention, Emotional, Energy/Drive, Learn, Memory, Perception, Problem Solve, Safety Awareness, Thought, Understand, Temperament/Personality Psychosocial Skills: Coping Strategies, Environmental  Adaptations, Habits, Interpersonal Interaction, Routines and Behaviors   Visit Diagnosis: Difficulty coping  Frontal lobe and executive function deficit  Severe episode of recurrent major depressive disorder, without psychotic features [facility])    Problem List Patient Active Problem List   Diagnosis Date Noted   Educated about management of weight 12/04/2020   Vitamin D deficiency 07/17/2020   Mixed hyperlipidemia 07/17/2020   Epidermal cyst of vulva 05/29/2020   Itching in the vaginal area 05/29/2020   Gums, bleeding 02/21/2020   Screening for  colorectal cancer 11/29/2019   Encounter for gynecological examination with Papanicolaou smear of cervix 11/29/2019   Morbid obesity (HCC) 11/29/2019   PMS (premenstrual syndrome) 11/14/2019   Dysmenorrhea 11/14/2019   Menorrhagia with regular cycle 11/14/2019   Breast nodule 01/02/2015   Morbid obesity with BMI of 60.0-69.9, adult (HCC) 09/21/2014   Obesity, BMI unknown 05/04/2014   ASCUS with positive high risk HPV 02/22/2014   Contraception management 01/06/2013   Recurrent UTI 01/06/2013   Hypothyroidism 11/01/2009   ANXIETY DEPRESSION 11/01/2009   ALLERGIC ASTHMA 11/01/2009   HEMATOCHEZIA 11/01/2009   LATEX ALLERGY 11/01/2009   PERSONAL HX OTH ALLERG OTH THAN MEDICINAL AGTS 11/01/2009    04/26/2021  Donne Hazel, MOT, OTR/L  04/26/2021, 2:10 PM  [facility] PARTIAL HOSPITALIZATION PROGRAM [address] [place], [place], [address] Phone: [phone]   Fax:  [phone]  Name: [name] MRN: [id number] Date of Birth: 1975-11-17

## 2021-04-29 ENCOUNTER — Other Ambulatory Visit (HOSPITAL_COMMUNITY): Payer: BC Managed Care – PPO

## 2021-04-30 ENCOUNTER — Ambulatory Visit (HOSPITAL_COMMUNITY): Payer: BC Managed Care – PPO | Admitting: Psychiatry

## 2021-04-30 ENCOUNTER — Ambulatory Visit (HOSPITAL_COMMUNITY): Payer: BC Managed Care – PPO

## 2021-04-30 ENCOUNTER — Other Ambulatory Visit: Payer: Self-pay

## 2021-04-30 ENCOUNTER — Other Ambulatory Visit (HOSPITAL_COMMUNITY): Payer: BC Managed Care – PPO | Admitting: Family

## 2021-04-30 ENCOUNTER — Telehealth (HOSPITAL_COMMUNITY): Payer: Self-pay | Admitting: *Deleted
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
## 2021-04-30 NOTE — Telephone Encounter (Signed)
Patient called at 8:53 am on 04-30-2021 stating she would like to cancel her appt with Florencia Reasons due to still being in Group Therapy. Per pt she will call office back to resch with Peggy when her group therapy is done.

## 2021-04-30 NOTE — Progress Notes (Signed)
  [facility] Behavioral Health Partial Hospitalization Outpatient Program Discharge Summary  MARILYNNE DUPUIS [id number]  Admission date: 04/12/2010 Discharge date: 04/30/2021  Reason for admission: [name]  is a 45 y.o. Caucasian female presents with worsening anxiety. States she is stressed and overwhelm with her employer.  Reported strained relationship between she and her husband.  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  She denied previous inpatient admissions.  Denied previous suicide attempts.  Reports social isolation , mood irritability poor concentration and depressed mood.  Cheryle reported she is currently followed by [name] with a recent medication adjustment however states she is been unable to afford medication so she is continue to take same dose. patient was enrolled in partial psychiatric program on 04/12/21.  Family of Origin Issues:  was reported that Corinne is currently going through a separation and is the primary care provider for her mother who's health is declining.   Progress in Program Toward Treatment Goals: Ongoing, Patient attended and participated within the group session with active and engaged participation.  Continue medications as indicated by primary psychiatrist. patient to transition to intensive outpatient programming.   Progress (rationale): Start Intensive Outpatient programming (IOP) on 05/02/2019  Take all medications as prescribed. Keep all follow-up appointments as scheduled.  Do not consume alcohol or use illegal drugs while on prescription medications. Report any adverse effects from your medications to your primary care provider promptly.  In the event of recurrent symptoms or worsening symptoms, call 911, a crisis hotline, or go to the nearest emergency department for evaluation.    [name], NP 04/30/2021

## 2021-05-01 ENCOUNTER — Other Ambulatory Visit (HOSPITAL_COMMUNITY): Payer: BC Managed Care – PPO | Admitting: Family

## 2021-05-01 ENCOUNTER — Encounter (HOSPITAL_COMMUNITY): Payer: Self-pay | Admitting: Psychiatry

## 2021-05-01 ENCOUNTER — Ambulatory Visit (HOSPITAL_COMMUNITY): Payer: BC Managed Care – PPO

## 2021-05-01 ENCOUNTER — Other Ambulatory Visit: Payer: Self-pay

## 2021-05-01 ENCOUNTER — Other Ambulatory Visit (HOSPITAL_COMMUNITY): Payer: BC Managed Care – PPO

## 2021-05-01 DIAGNOSIS — F331 Major depressive disorder, recurrent, moderate: Secondary | ICD-10-CM

## 2021-05-01 DIAGNOSIS — F411 Generalized anxiety disorder: Secondary | ICD-10-CM | POA: Diagnosis not present

## 2021-05-01 NOTE — Progress Notes (Deleted)
Virtual Visit via Video Note  I connected with [name] on 05/01/21 at  9:00 AM EDT by a video enabled telemedicine application and verified that I am speaking with the correct person using two identifiers.  Location: Patient: Home Provider: Office   I discussed the limitations of evaluation and management by telemedicine and the availability of in person appointments. The patient expressed understanding and agreed to proceed.  I discussed the assessment and treatment plan with the patient. The patient was provided an opportunity to ask questions and all were answered. The patient agreed with the plan and demonstrated an understanding of the instructions.   The patient was advised to call back or seek an in-person evaluation if the symptoms worsen or if the condition fails to improve as anticipated.  I provided 15 minutes of non-face-to-face time during this encounter.   [name], NP

## 2021-05-01 NOTE — Progress Notes (Signed)
Virtual Visit via Video Note  I connected with [name] on 05/01/21 at  9:00 AM EDT by a video enabled telemedicine application and verified that I am speaking with the correct person using two identifiers.  Location: Patient: home  Provider: Office   I discussed the limitations of evaluation and management by telemedicine and the availability of in person appointments. The patient expressed understanding and agreed to proceed.    I discussed the assessment and treatment plan with the patient. The patient was provided an opportunity to ask questions and all were answered. The patient agreed with the plan and demonstrated an understanding of the instructions.   The patient was advised to call back or seek an in-person evaluation if the symptoms worsen or if the condition fails to improve as anticipated.  I provided 15 minutes of non-face-to-face time during this encounter.   [name], NP    Psychiatric Initial Adult Assessment   Patient Identification: [name] MRN:  [id number] Date of Evaluation:  05/01/2021 Referral Source: PHP Chief Complaint:   Chief Complaint   Anxiety; Depression    Visit Diagnosis:    ICD-10-CM   1. MDD (major depressive disorder), recurrent episode, moderate (HCC)  F33.1     2. GAD (generalized anxiety disorder)  F41.1       History of Present Illness: [name]  is a 45 y.o. Caucasian female presents with worsening anxiety. States she is stressed and overwhelm with her employer.  Reported strained relationship between she and her husband.  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  She denied previous inpatient admissions.  Denied previous suicide attempts.  Reports social isolation , mood irritability poor concentration and depressed mood.  Cheryle reported she is currently followed by [name] with a recent medication adjustment however states she is been unable to afford medication so she  is continue to take same dose.  Evaluation: [name] reported mild improvement with depressive symptoms.  States she continues to struggle with mood irritability and depression.  States more recently her mother has been in the hospital which she has been devoted most of her time to caring for her mother.  She continues to deny suicidal or homicidal ideations.  Denies auditory or visual hallucination.  Patient recently completed partial hospitalization programming and is starting intensive outpatient programming on 05/01/2021.   Associated Signs/Symptoms: Depression Symptoms:  depressed mood, feelings of worthlessness/guilt, difficulty concentrating, hopelessness, anxiety, (Hypo) Manic Symptoms:  Irritable Mood, Anxiety Symptoms:  Excessive Worry, Social Anxiety, Psychotic Symptoms:   N/A PTSD Symptoms: NA  Past Psychiatric History: generalized anxiety disorder, major depressive disorder  Previous Psychotropic Medications: No   Substance Abuse History in the last 12 months:  No.  Consequences of Substance Abuse: NA  Past Medical History:  Past Medical History:  Diagnosis Date   Allergy    Anxiety    Arthritis    Asthma    Breast nodule 01/02/2015   Depression    Fatigue    GERD (gastroesophageal reflux disease)    Herpes    HPV in female    Obesity    PMDD (premenstrual dysphoric disorder)    Thyroid disease    hypothryoidism   Trauma    Vaginal Pap smear, abnormal    Vitamin D deficiency     Past Surgical History:  Procedure Laterality Date   CESAREAN SECTION     CHOLECYSTECTOMY     TONSILECTOMY/ADENOIDECTOMY WITH MYRINGOTOMY  WISDOM TOOTH EXTRACTION      Family Psychiatric History:   Family History:  Family History  Problem Relation Age of Onset   Hypertension Mother    Arthritis Mother    Anxiety disorder Mother    Asthma Father    COPD Father    Arthritis Father    Chronic bronchitis Father    Hypertension Father    Hyperlipidemia Father     Anxiety disorder Father    Depression Father    Alcohol abuse Father    Drug abuse Brother    Alcohol abuse Brother    Hypertension Brother    Mental illness Brother    Depression Brother    Cancer Maternal Aunt    Cancer Maternal Uncle    Cancer Paternal Aunt    Cancer Paternal Uncle    Asthma Maternal Grandmother    Arthritis Maternal Grandmother    Hypertension Maternal Grandmother    Congestive Heart Failure Maternal Grandmother    Diabetes Maternal Grandfather    Stroke Maternal Grandfather    ADD / ADHD Son    Hyperlipidemia Brother    Hearing loss Brother    Asthma Brother    Learning disabilities Brother     Social History:   Social History   Socioeconomic History   Marital status: Married    Spouse name: Not on file   Number of children: 2   Years of education: Not on file   Highest education level: Associate degree: occupational, Scientist, product/process development, or vocational program  Occupational History   Not on file  Tobacco Use   Smoking status: Former   Smokeless tobacco: Never   Tobacco comments:    light smoker as a teenager   Building services engineer Use: Never used  Substance and Sexual Activity   Alcohol use: Yes    Comment: occ   Drug use: No   Sexual activity: Yes    Birth control/protection: Pill  Other Topics Concern   Not on file  Social History Narrative   Social Hx:   Current living situation- living in [place] with husband, son is 50/50 custody with biological father, daughter lives with pt's mom in [place]. Pt lives close to her mom   Raised in [place] by mom and dad   Siblings- 2 brothers and pt is the youngest. She is 88 yrs younger than her youngest brother   Schooling- associates degree in early childhood   Married- yes - currently in 3rd marriage   Kids- 2 from 1st marriage      Legal issues- denies   Social Determinants of Corporate investment banker Strain: Not on file  Food Insecurity: Not on file  Transportation Needs: Not on file   Physical Activity: Not on file  Stress: Not on file  Social Connections: Not on file    Additional Social History:   Allergies:   Allergies  Allergen Reactions   Latex    Naproxen    Shrimp [Shellfish Allergy] Other (See Comments)    Intolerance     Metabolic Disorder Labs: No results found for: HGBA1C, MPG No results found for: PROLACTIN Lab Results  Component Value Date   CHOL 193 05/25/2020   TRIG 102 05/25/2020   HDL 42 05/25/2020   LDLCALC 100 05/25/2020   Lab Results  Component Value Date   TSH 1.460 11/27/2020    Therapeutic Level Labs: No results found for: LITHIUM No results found for: CBMZ No results found for: VALPROATE  Current  Medications: Current Outpatient Medications  Medication Sig Dispense Refill   ALPRAZolam (XANAX) 0.5 MG tablet Take 1 tablet (0.5 mg total) by mouth 2 (two) times daily as needed for anxiety. 60 tablet 0   augmented betamethasone dipropionate (DIPROLENE-AF) 0.05 % cream      busPIRone (BUSPAR) 10 MG tablet Take 2 tablets (20 mg total) by mouth 2 (two) times daily. 360 tablet 0   calcium-vitamin D (OSCAL WITH D) 500-200 MG-UNIT tablet Take 1 tablet by mouth.     cetirizine (ZYRTEC) 10 MG tablet Take 10 mg by mouth daily.     ibuprofen (ADVIL) 600 MG tablet Take 600 mg by mouth 3 (three) times daily. As needed     levalbuterol (XOPENEX HFA) 45 MCG/ACT inhaler Inhale into the lungs every 4 (four) hours as needed for wheezing.     levothyroxine (SYNTHROID) 200 MCG tablet TAKE 1 TABLET(200 MCG) BY MOUTH DAILY BEFORE AND BREAKFAST 90 tablet 1   levothyroxine (SYNTHROID) 75 MCG tablet TAKE 1 TABLET(75 MCG) BY MOUTH DAILY 90 tablet 0   MELATONIN PO Take by mouth at bedtime.     Misc Natural Products (CRANBERRY/PROBIOTIC PO) Take by mouth daily. Cranberry/Probiotic/Prebiotic Blend-2 daily     montelukast (SINGULAIR) 10 MG tablet Take 10 mg by mouth daily.      Multiple Vitamins-Minerals (WOMENS MULTI VITAMIN & MINERAL PO) Take by mouth  daily.     norethindrone (AYGESTIN) 5 MG tablet TAKE 2 TABLETS BY MOUTH DAILY 60 tablet 3   venlafaxine XR (EFFEXOR-XR) 75 MG 24 hr capsule Take 3 capsules (225 mg total) by mouth daily with breakfast. 270 capsule 0   ziprasidone (GEODON) 40 MG capsule Take 1 capsule (40 mg total) by mouth 2 (two) times daily with a meal. 60 capsule 1   No current facility-administered medications for this visit.    Musculoskeletal: Strength & Muscle Tone: within normal limits Gait & Station: normal Patient leans: N/A  Psychiatric Specialty Exam: Review of Systems  There were no vitals taken for this visit.There is no height or weight on file to calculate BMI.  General Appearance: Casual  Eye Contact:  Good  Speech:  Clear and Coherent  Volume:  Normal  Mood:  Anxious and Depressed  Affect:  Appropriate  Thought Process:  Coherent  Orientation:  Full (Time, Place, and Person)  Thought Content:  Logical  Suicidal Thoughts:  No  Homicidal Thoughts:  No  Memory:  Immediate;   Fair Recent;   Fair  Judgement:  Fair  Insight:  Fair  Psychomotor Activity:  Normal  Concentration:  Concentration: Fair  Recall:  Fair  Fund of Knowledge:Good  Language: Good  Akathisia:  No  Handed:  Right  AIMS (if indicated):  done  Assets:  Communication Skills Physical Health Social Support  ADL's:  Intact  Cognition: WNL  Sleep:  Good   Screenings: GAD-7    Flowsheet Row Counselor from [facility]26[facility]2020 [facility] Counselor from 03/02/2020 in [facility]  Total GAD-7 Score 9 15      PHQ2-9    Flowsheet Row Counselor from 05/01/2021 in [facility]IORAL [facility]ALTH INTENSIVE PSYCH Video Visit from 04/18/2021 in [facility] Counselor from 04/17/2021 in BEHAVIORAL HEALTH PARTIAL HOSPITALIZATION PROGRAM Video Visit from 04/04/2021 in [facility] Video Visit from  02/07/2021 in [facility]  PHQ-2 Total Score 5 5 6 6 2   PHQ-9 Total Score 18 18 22 23  12  Flowsheet Row Counselor from 05/01/2021 in BEHAVIORAL HEALTH INTENSIVE PSYCH Video Visit from 04/18/2021 in [facility] Counselor from 04/17/2021 in BEHAVIORAL HEALTH PARTIAL HOSPITALIZATION PROGRAM  C-SSRS RISK CATEGORY Error: Q3, 4, or 5 should not be populated when Q2 is No Error: Q3, 4, or 5 should not be populated when Q2 is No Error: Q3, 4, or 5 should not be populated when Q2 is No       Assessment and Plan:  Patient to start intensive outpatient programming on 05/01/2021 Continue medications as indicated  Treatment plan was reviewed and agreed upon by NPT Ariabella Brien and patient Debraann Veal's need for continued group services   [name], NP 7/20/202212:28 PM

## 2021-05-01 NOTE — Progress Notes (Signed)
Virtual Visit via Video Note  I connected with [name] on @TODAY @ at  9:00 AM EDT by a video enabled telemedicine application and verified that I am speaking with the correct person using two identifiers.  Location: Patient: at home Provider: at office   I discussed the limitations of evaluation and management by telemedicine and the availability of in person appointments. The patient expressed understanding and agreed to proceed.  I discussed the assessment and treatment plan with the patient. The patient was provided an opportunity to ask questions and all were answered. The patient agreed with the plan and demonstrated an understanding of the instructions.   The patient was advised to call back or seek an in-person evaluation if the symptoms worsen or if the condition fails to improve as anticipated.  I provided 20 minutes of non-face-to-face time during this encounter.   , M.Ed,CNA   Patient ID: [name], female   DOB: 1976/06/01, 45 y.o.   MRN: [id number] As per previous [facility] note states:  Pt presents as referral for intensive treatment by her psychiatrist, Dr [id number] due to increased depression/anxiety symptoms and decrease in functioning. Pt is currently followed by therapy and psychiatry at [facility]. Pt reports stressors of work, limited mobility, marriage issues, sleep, severe fatigue. Pt reports decline in MH began approximately 1.5 years ago and pt suspects it has to do with perimenopause. Pt states a further decline in approximately December when her son chose to move in with his father. Pt states her husband has said he wants to separate and plans to leave by the end of July. Pt states work is not accommodating to her mobility issues or understanding of her MH. Pt reports missing work more frequently. Pt reports feeling hopeless, overwhelmed, and unable to manage stressors. Pt denies AVH, problematic substance use, and HI. Pt reports daily passive  SI and thoughts of self-harm. Pt denies plan/intent and can contract for safety.   Current Symptoms/Problems: Pt reports depressed mood, constant worry, sleep issues, increased appetite, severe fatigue, low energy, difficulty focusing, decline in hygiene, poor support  Pt transitioned from PHP to MH-IOP today.  Reports that PHP was helpful.  Pt states she is awaiting on her insurance to approve  a sleep study.  On a scale of 1-10 (10 being the worst); pt rates her depression at a 6 and anxiety at a 5.  Denies SI/HI or A/V hallucinations. A:  Oriented pt to MH-IOP.  Pt gave verbal consent for treatment, to release chart information to referred providers and to complete any forms if needed.  Pt also gave consent for attending group virtually d/t COVID-19 social distancing restrictions.  Encouraged support groups.  F/U with Dr. August and Michae Kava, LCSW.  R:  Patient receptive.  Florencia Reasons, M.Ed,CNA

## 2021-05-02 ENCOUNTER — Other Ambulatory Visit: Payer: Self-pay

## 2021-05-02 ENCOUNTER — Other Ambulatory Visit (HOSPITAL_COMMUNITY): Payer: BC Managed Care – PPO | Admitting: Psychiatry

## 2021-05-02 ENCOUNTER — Other Ambulatory Visit (HOSPITAL_COMMUNITY): Payer: BC Managed Care – PPO

## 2021-05-02 ENCOUNTER — Ambulatory Visit (HOSPITAL_COMMUNITY): Payer: BC Managed Care – PPO

## 2021-05-02 ENCOUNTER — Encounter (HOSPITAL_COMMUNITY): Payer: Self-pay | Admitting: Family

## 2021-05-02 ENCOUNTER — Encounter (HOSPITAL_COMMUNITY): Payer: Self-pay

## 2021-05-02 DIAGNOSIS — F411 Generalized anxiety disorder: Secondary | ICD-10-CM | POA: Diagnosis not present

## 2021-05-02 DIAGNOSIS — F331 Major depressive disorder, recurrent, moderate: Secondary | ICD-10-CM

## 2021-05-02 NOTE — Progress Notes (Signed)
Virtual Visit via Video Note  I connected with [name] on 05/02/21 at  9:00 AM EDT by a video enabled telemedicine application and verified that I am speaking with the correct person using two identifiers.  At orientation to the IOP program, Case Manager discussed the limitations of evaluation and management by telemedicine and the availability of in person appointments. The patient expressed understanding and agreed to proceed with virtual visits throughout the duration of the program.   Location:  Patient: Patient Home Provider: Home Office   History of Present Illness: MDD and GAD  Observations/Objective: Check In: Case Manager checked in with all participants to review discharge dates, insurance authorizations, work-related documents and needs from the treatment team regarding medications. Client stated needs and engaged in discussion. Case Manager introduced a new Client to the group, with group members welcoming and starting the joining process.  Initial Therapeutic Activity: Counselor facilitated a check-in with group members to assess mood and current functioning. Client shared details of their mental health management since our last session, including challenges and successes. Counselor engaged group in discussion, covering the following topics: fear and anxiety, future planning, coping skills, and positive affirmaitons. Client presents with moderate depression and moderate anxiety. Client denied any current SI/HI/psychosis.   Second Therapeutic Activity: Counselor introduced [place], [place] Chaplain to present information and discussion on Grief and Loss. Group members engaged in discussion, sharing how grief impacts them, what comforts them, what emotions are felt, labeling losses, etc. After guest speaker logged off, Counselor prompted group to spend 10-15 minutes journaling to process personal grief and loss situations. Counselor processed entries with group and  client's identified areas for additional processing in individual therapy. Client noted grief and loss related to abandonment.  Check Out:  Counselor prompted group members to identify one self-care practice or productivity activity they would like to engage in today. Client plans to visit her mother in the hospital. Client endorsed safety plan to be followed to prevent safety issues.  Assessment and Plan: Clinician recommends that Client remain in IOP treatment to better manage mental health symptoms, stabilization and to address treatment plan goals. Clinician recommends adherence to crisis/safety plan, taking medications as prescribed, and following up with medical professionals if any issues arise.    Follow Up Instructions: Clinician will send Webex link for next session. The Client was advised to call back or seek an in-person evaluation if the symptoms worsen or if the condition fails to improve as anticipated.     I provided 180 minutes of non-face-to-face time during this encounter.     Hilbert Odor, LCSW

## 2021-05-02 NOTE — Progress Notes (Signed)
Virtual Visit via Video Note  I connected with [name] on 05/01/21 at  9:00 AM EDT by a video enabled telemedicine application and verified that I am speaking with the correct person using two identifiers.  At orientation to the IOP program, Case Manager discussed the limitations of evaluation and management by telemedicine and the availability of in person appointments. The patient expressed understanding and agreed to proceed with virtual visits throughout the duration of the program.   Location:  Patient: Patient Home Provider: Home Office   History of Present Illness: MDD  Observations/Objective: Check In: Case Manager checked in with all participants to review discharge dates, insurance authorizations, work-related documents and needs from the treatment team regarding medications. Client stated needs and engaged in discussion. Case Manager introduced a new Client to the group, with group members welcoming and starting the joining process.  Initial Therapeutic Activity: Counselor facilitated a check-in with group members to assess mood and current functioning. Client shared details of their mental health management since our last session, including challenges and successes. Counselor engaged group in discussion, covering the following topics: [employer] [place], [employer], Disability Rights of [place], anger management, communicating feelings and needs with important people. Client presents with severe depression and severe anxiety. Client denied any current SI/HI/psychosis.   Second Therapeutic Activity: Counselor presented the anxiety cycle, discussing the use of avoidance in causing anxiety to build overtime. Counselor shared examples and prompted group members to share about their unique presentations of anxiety and avoidant behaviors. Counselor shared a video on 10 skills in overcoming anxiety and fears, preventing avoidant behaviors. Group discussed takeaways and practical  application of skills in their daily lives.  Check Out:  Counselor closed program by allowing time to celebrate a graduating group member. Counselor shared reflections on progress and allow space for group members to share well wishes and encouragements with the graduating client. Counselor prompted graduating client to share takeaways, reflect on progress and final thoughts for the group. Client endorsed safety plan to be followed to prevent safety issues.  Assessment and Plan: Clinician recommends that Client remain in IOP treatment to better manage mental health symptoms, stabilization and to address treatment plan goals. Clinician recommends adherence to crisis/safety plan, taking medications as prescribed, and following up with medical professionals if any issues arise.    Follow Up Instructions: Clinician will send Webex link for next session. The Client was advised to call back or seek an in-person evaluation if the symptoms worsen or if the condition fails to improve as anticipated.     I provided 180 minutes of non-face-to-face time during this encounter.     Hilbert Odor, LCSW

## 2021-05-03 ENCOUNTER — Other Ambulatory Visit (HOSPITAL_COMMUNITY): Payer: BC Managed Care – PPO

## 2021-05-03 ENCOUNTER — Ambulatory Visit (HOSPITAL_COMMUNITY): Payer: BC Managed Care – PPO

## 2021-05-03 ENCOUNTER — Other Ambulatory Visit: Payer: Self-pay

## 2021-05-03 ENCOUNTER — Other Ambulatory Visit (HOSPITAL_COMMUNITY): Payer: BC Managed Care – PPO | Admitting: Psychiatry

## 2021-05-03 DIAGNOSIS — F331 Major depressive disorder, recurrent, moderate: Secondary | ICD-10-CM

## 2021-05-03 DIAGNOSIS — F411 Generalized anxiety disorder: Secondary | ICD-10-CM

## 2021-05-06 ENCOUNTER — Other Ambulatory Visit (HOSPITAL_COMMUNITY): Payer: BC Managed Care – PPO

## 2021-05-06 ENCOUNTER — Other Ambulatory Visit: Payer: Self-pay

## 2021-05-06 ENCOUNTER — Encounter (HOSPITAL_COMMUNITY): Payer: Self-pay

## 2021-05-06 NOTE — Progress Notes (Signed)
Virtual Visit via Video Note  I connected with [name] on 05/03/21 at  9:00 AM EDT by a video enabled telemedicine application and verified that I am speaking with the correct person using two identifiers.  At orientation to the IOP program, Case Manager discussed the limitations of evaluation and management by telemedicine and the availability of in person appointments. The patient expressed understanding and agreed to proceed with virtual visits throughout the duration of the program.   Location:  Patient: Patient Home Provider: Home Office   History of Present Illness: MDD and GAD  Observations/Objective: Check In: Case Manager checked in with all participants to review discharge dates, insurance authorizations, work-related documents and needs from the treatment team regarding medications. Client stated needs and engaged in discussion. Case Manager introduced a new Client to the group, with group members welcoming and starting the joining process.  Initial Therapeutic Activity: Counselor facilitated a check-in with group members to assess mood and current functioning. Client shared details of their mental health management since our last session, including challenges and successes. Counselor engaged group in discussion, covering the following topics: self-care plan, weekend stability plan, strategies in preventing and coping with panic attacks, and about local resources. Client presents with moderate depression and moderate anxiety. Client denied any current SI/HI/psychosis.   Second Therapeutic Activity: Counselor shared psychoeducation on grounding and mindfulness techniques. Counselor facilitated a guided imagery which incorporated deep breathing, progressive muscle relaxation, visualizations, and positive affirmations, called Finding Your Authentic Self. Group members participated in the prompts and scripts, reengaging when attention wandered. Counselor allowed group members to  share about their experiences. Client stated that it was relaxing and that they would be open to listening to guided imageries in the future.   Check Out:  Counselor prompted group members to identify one self-care practice or productivity activity they would like to engage in over the weekend. Client plans to visit her mom in the hospital and swim in the pool. Client endorsed safety plan to be followed to prevent safety issues.  Assessment and Plan: Clinician recommends that Client remain in IOP treatment to better manage mental health symptoms, stabilization and to address treatment plan goals. Clinician recommends adherence to crisis/safety plan, taking medications as prescribed, and following up with medical professionals if any issues arise.    Follow Up Instructions: Clinician will send Webex link for next session. The Client was advised to call back or seek an in-person evaluation if the symptoms worsen or if the condition fails to improve as anticipated.     I provided 180 minutes of non-face-to-face time during this encounter.     Hilbert Odor, LCSW

## 2021-05-07 ENCOUNTER — Other Ambulatory Visit: Payer: Self-pay

## 2021-05-07 ENCOUNTER — Other Ambulatory Visit (HOSPITAL_COMMUNITY): Payer: BC Managed Care – PPO

## 2021-05-07 NOTE — Psych (Signed)
Virtual Visit via Video Note  I connected with Novia Lansberry Kamer on 04/11/21 at  9:00 AM EDT by a video enabled telemedicine application and verified that I am speaking with the correct person using two identifiers.  Location: Patient: patient home Provider: clinical home office   I discussed the limitations of evaluation and management by telemedicine and the availability of in person appointments. The patient expressed understanding and agreed to proceed.   I discussed the assessment and treatment plan with the patient. The patient was provided an opportunity to ask questions and all were answered. The patient agreed with the plan and demonstrated an understanding of the instructions.   The patient was advised to call back or seek an in-person evaluation if the symptoms worsen or if the condition fails to improve as anticipated.  Pt was provided 240 minutes of non-face-to-face time during this encounter.   Donia Guiles, LCSW    [facility] PHP THERAPIST PROGRESS NOTE  LASHEBA STEVENS [id number]  Session Time: 9:00 - 10:00  Participation Level: Active  Behavioral Response: CasualAlertDepressed  Type of Therapy: Group Therapy  Treatment Goals addressed: Coping  Interventions: CBT, DBT, Supportive, and Reframing  Summary: Clinician led check-in regarding current stressors and situation. Clinician utilized active listening and empathetic response and validated patient emotions. Clinician facilitated processing group on pertinent issues.   Therapist Response: MADILYNE TADLOCK is a 45 y.o. female who presents with depression symptoms. Patient arrived within time allowed and reports that she is feeling "anxious." Patient rates her mood at a 5 on a scale of 1-10 with 10 being great. Pt reports anxiety regarding newness of group setting. Pt states struggling with getting out of bed and managing chores and hygiene. Pt states she watched tv yesterday. Pt states struggling with racing  thoughts at night and not sleeping well. Pt able to process. Pt engaged in discussion.         Session Time: 10:00 - 11:00   Participation Level: Active   Behavioral Response: CasualAlertDepressed   Type of Therapy: Group Therapy   Treatment Goals addressed: Coping   Interventions: CBT, DBT, Supportive and Reframing   Summary: Cln led discussion on "spiraling" or when our thoughts compound on one another to descend our feelings into worse and worse situations. Group members discussed the ways in which spiraling is difficult for them and when they are especially vulnerable. Cln offered DBT distraction skills as a way to halt the spiral once you recognize it.      Therapist Response: Pt engaged in discussion and reports spiraling is an issue for her. Pt is able to increase awareness of spiraling throughout the discussion.         Session Time: 11:00- 12:00   Participation Level: Active   Behavioral Response: CasualAlertDepressed   Type of Therapy: Group Therapy, OT   Treatment Goals addressed: Coping   Interventions: Psychosocial skills training, Supportive   Summary: Occupational Therapy group   Therapist Response: Patient engaged in group. See OT note.           Session Time: 12:00 -1:00   Participation Level: Active   Behavioral Response: CasualAlertDepressed   Type of Therapy: Group therapy   Treatment Goals addressed: Coping   Interventions: CBT; Solution focused; Supportive; Reframing   Summary: 12:00 - 12:50: Cln continued topic of DBT distress tolerance skills and the ACCEPTS distraction skill. Group reviewed P-T-S skills and discussed how they can practice them in their every day life.   12:50 -1:00  Clinician led check-out. Clinician assessed for immediate needs, medication compliance and efficacy, and safety concerns   Therapist Response: 12:50 - 1:00: Pt engaged in discussion and reports she is most likely to practice the "T" skill.  12:50 - 1:00:  At check-out, patient rates her mood at a 6 on a scale of 1-10 with 10 being great. Pt reports afternoon plans of attempting to run errands. Pt demonstrates some progress as evidenced by participating in first group session. Patient denies SI/HI at the end of group.      Suicidal/Homicidal: Nowithout intent/plan  Plan: Pt will continue in PHP while working to decrease depression symptoms, increase daily functioning, and increase ability to manage symptoms in a healthy manner.   Diagnosis: Severe episode of recurrent major depressive disorder, without psychotic features (HCC) [F33.2]    1. Severe episode of recurrent major depressive disorder, without psychotic features (HCC)       Donia Guiles, LCSW 05/07/2021

## 2021-05-07 NOTE — Psych (Signed)
Virtual Visit via Video Note  I connected with [facility] Corle on 04/11/21 at  9:00 AM EDT by a video enabled telemedicine application and verified that I am speaking with the correct person using two identifiers.  Location: Patient: patient home Provider: clinical home office   I discussed the limitations of evaluation and management by telemedicine and the availability of in person appointments. The patient expressed understanding and agreed to proceed.  I discussed the assessment and treatment plan with the patient. The patient was provided an opportunity to ask questions and all were answered. The patient agreed with the plan and demonstrated an understanding of the instructions.   The patient was advised to call back or seek an in-person evaluation if the symptoms worsen or if the condition fails to improve as anticipated.  Cln and pt completed treatment plan and pt stated verbal alignment with plan. Pt gave verbal consent to treatment and virtual treatment, agreement with group commitments, and permission to release information for purposes of any requested paperwork.   I provided 10 minutes of non-face-to-face time during this encounter.   Donia Guiles, LCSW

## 2021-05-08 ENCOUNTER — Other Ambulatory Visit (HOSPITAL_COMMUNITY): Payer: BC Managed Care – PPO | Admitting: Psychiatry

## 2021-05-08 NOTE — Patient Instructions (Signed)
D:  Patient will complete MH-IOP on 05-10-21.  A:  Follow up with [name] and Florencia Reasons, LCSW.  Strongly encouraged support groups through [facility] [phone].  Return to work on 05-13-21.  R:  Patient receptive.

## 2021-05-08 NOTE — Progress Notes (Signed)
Virtual Visit via Telephone Note  I connected with Breck Hollinger Sullenberger on 05/08/21 at  9:00 AM EDT by telephone and verified that I am speaking with the correct person using two identifiers.  Location: Patient: Home Provider: Office    I discussed the limitations, risks, security and privacy concerns of performing an evaluation and management service by telephone and the availability of in person appointments. I also discussed with the patient that there may be a patient responsible charge related to this service. The patient expressed understanding and agreed to proceed   I discussed the assessment and treatment plan with the patient. The patient was provided an opportunity to ask questions and all were answered. The patient agreed with the plan and demonstrated an understanding of the instructions.   The patient was advised to call back or seek an in-person evaluation if the symptoms worsen or if the condition fails to improve as anticipated.  I provided 15 minutes of non-face-to-face time during this encounter.   [name], NP   [facility] Behavioral Health Intensive Outpatient Program Discharge Summary  KEARIE MENNEN [id number]  Admission date:04/23/2021 Discharge date: 05/10/2021  Reason for admission: Per admission assessment note: Carletta Feasel  is a 45 y.o. Caucasian female presents with worsening anxiety. States she is stressed and overwhelm with her employer.  Reported strained relationship between she and her husband.  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  She denied previous inpatient admissions.  Denied previous suicide attempts.  Reports social isolation , mood irritability poor concentration and depressed mood.  Cheryle reported she is currently followed by [name] with a recent medication adjustment however states she is been unable to afford medication so she is continue to take same dose.    Family of Origin Issues: Shanie  reported she continues to care for her mother whose health is deteriorating.  States she and her husband has been discussing divorce.  Rated her depression 6 out of 10 with 10 being the worst during this assessment.  Denying suicidal or homicidal ideations.  States her mood had improved slightly since completing partial hospitalization programming and intensive outpatient programs.  Patient to keep follow-up appointment with all outpatient providers.  Progress in Program Toward Treatment Goals: Dajanee attended and participated with daily group session with active and engaged participation.  Reporting overall her mood has improved since completing both programs.  Currently seeking therapist at this time.  Reports a recent minor setback as she reports family members have been calling her letting her know what a bad job she is doing caring for her mother.  States that she is feeling" really defeated".  Did denies suicidal ideations.  States she is going to continue working on setting boundaries.  No medications was refilled at discharge. Patient to continue working or coping skills.   Progress (rationale): Patient to keep follow up  with MD Michae Kava and Peggy Bynums. Patient was provided with additional outpatient resources for [facility] and [facility].   Take all medications as prescribed. Keep all follow-up appointments as scheduled.  Do not consume alcohol or use illegal drugs while on prescription medications. Report any adverse effects from your medications to your primary care provider promptly.  In the event of recurrent symptoms or worsening symptoms, call 911, a crisis hotline, or go to the nearest emergency department for evaluation.    [name], NP 05/08/2021

## 2021-05-09 ENCOUNTER — Telehealth (HOSPITAL_COMMUNITY): Payer: BC Managed Care – PPO | Admitting: Psychiatry

## 2021-05-09 ENCOUNTER — Encounter (HOSPITAL_COMMUNITY): Payer: Self-pay | Admitting: Psychiatry

## 2021-05-09 ENCOUNTER — Other Ambulatory Visit: Payer: Self-pay

## 2021-05-09 ENCOUNTER — Other Ambulatory Visit (HOSPITAL_COMMUNITY): Payer: BC Managed Care – PPO | Admitting: Psychiatry

## 2021-05-09 DIAGNOSIS — F411 Generalized anxiety disorder: Secondary | ICD-10-CM | POA: Diagnosis not present

## 2021-05-09 DIAGNOSIS — F331 Major depressive disorder, recurrent, moderate: Secondary | ICD-10-CM | POA: Diagnosis not present

## 2021-05-09 NOTE — Progress Notes (Signed)
Virtual Visit via Video Note  I connected with [name] on 05/09/21 at  9:00 AM EDT by a video enabled telemedicine application and verified that I am speaking with the correct person using two identifiers.  At orientation to the IOP program, Case Manager discussed the limitations of evaluation and management by telemedicine and the availability of in person appointments. The patient expressed understanding and agreed to proceed with virtual visits throughout the duration of the program.   Location:  Patient: Patient Home Provider: Home Office   History of Present Illness: MDD and GAD  Observations/Objective: Check In: Case Manager checked in with all participants to review discharge dates, insurance authorizations, work-related documents and needs from the treatment team regarding medications. Client stated needs and engaged in discussion. Case Manager introduced 2 new Clients to the group, with group members welcoming and starting the joining process.  Initial Therapeutic Activity: Counselor introduced [employer], [place] Chaplain to present information and discussion on Grief and Loss. Group members engaged in discussion, sharing how grief impacts them, what comforts them, what emotions are felt, labeling losses, etc. After guest speaker logged off, Counselor prompted group to spend 10-15 minutes journaling to process personal grief and loss situations. Counselor processed entries with group and client's identified areas for additional processing in individual therapy. Client noted loss related mental health issues impacting all aspects of life.   Second Therapeutic Activity: Counselor facilitated a check-in with group members to assess mood and current functioning. Client shared details of their mental health management since our last session, including challenges and successes. Counselor engaged group in discussion, covering the following topics: mental health stigma in minority  communities, surviving suicide attempts/thoughts, progress in coping skill application, structuring time for self-care and employment concerns. Client presents with moderate depression and moderate anxiety. Client denied any current SI/HI/psychosis.  Check Out:  Counselor closed program by allowing time to celebrate a graduating group member. Counselor shared reflections on progress and allow space for group members to share well wishes and encouragements with the graduating client. Counselor prompted graduating client to share takeaways, reflect on progress and final thoughts for the group. Client endorsed safety plan to be followed to prevent safety issues.  Assessment and Plan: Clinician recommends that Client remain in IOP treatment to better manage mental health symptoms, stabilization and to address treatment plan goals. Clinician recommends adherence to crisis/safety plan, taking medications as prescribed, and following up with medical professionals if any issues arise.    Follow Up Instructions: Clinician will send Webex link for next session. The Client was advised to call back or seek an in-person evaluation if the symptoms worsen or if the condition fails to improve as anticipated.     I provided 180 minutes of non-face-to-face time during this encounter.     Hilbert Odor, LCSW

## 2021-05-09 NOTE — Progress Notes (Signed)
Virtual Visit via Video Note  I connected with [name] on @TODAY @ at  9:00 AM EDT by a video enabled telemedicine application and verified that I am speaking with the correct person using two identifiers.  Location: Patient: at home Provider: at office   I discussed the limitations of evaluation and management by telemedicine and the availability of in person appointments. The patient expressed understanding and agreed to proceed.   I discussed the assessment and treatment plan with the patient. The patient was provided an opportunity to ask questions and all were answered. The patient agreed with the plan and demonstrated an understanding of the instructions.   The patient was advised to call back or seek an in-person evaluation if the symptoms worsen or if the condition fails to improve as anticipated.  I provided 20 minutes of non-face-to-face time during this encounter.   , M.Ed,CNA   Patient ID: [name], female   DOB: 01-25-1976, 45 y.o.   MRN: [id number] As per previous [facility] note states:  Pt presents as referral for intensive treatment by her psychiatrist, Dr [phone] due to increased depression/anxiety symptoms and decrease in functioning. Pt is currently followed by therapy and psychiatry at [facility] [place]. Pt reports stressors of work, limited mobility, marriage issues, sleep, severe fatigue. Pt reports decline in MH began approximately 1.5 years ago and pt suspects it has to do with perimenopause. Pt states a further decline in approximately December when her son chose to move in with his father. Pt states her husband has said he wants to separate and plans to leave by the end of July. Pt states work is not accommodating to her mobility issues or understanding of her MH. Pt reports missing work more frequently. Pt reports feeling hopeless, overwhelmed, and unable to manage stressors. Pt denies AVH, problematic substance use, and HI. Pt reports daily passive  SI and thoughts of self-harm. Pt denies plan/intent and can contract for safety.   Current Symptoms/Problems: Pt reports depressed mood, constant worry, sleep issues, increased appetite, severe fatigue, low energy, difficulty focusing, decline in hygiene, poor support   Pt attended 8 days out of 10 in MH-IOP; she will discharge tomorrow (05-10-21).  States she needs to return to work on 05-13-21.  Reports overall feeling better.  "I learned a few more skills."  According to pt, she continues to struggle with various sx's on and off from day to day.  On a scale from 1-10 (10 being the worst); pt rates her anxiety at a 7 and her depression at a 6.  Denies SI/HI or A/V hallucinations.  Pt states she had an appt with Dr. 11-13-1987 this a.m but didn't pick up in time.  Informed pt that usually [name] follows up with pts after they complete MH-IOP.  Expressed to pt that case manager was unaware that she (pt) had an appt today b/c if she would've known, it would've been rescheduled.  A:  D/C pt tomorrow.  F/U with [name] (will get front desk to call pt to reschedule) and pt states she will contact Peggy Bynum's office for an appt.  Strongly recommended groups through [facility].  RTW on 05-13-21; without any restrictions.  R:  Patient receptive.  11-13-1985, M.Ed,CNA

## 2021-05-10 ENCOUNTER — Other Ambulatory Visit (HOSPITAL_COMMUNITY): Payer: BC Managed Care – PPO | Admitting: Family

## 2021-05-10 ENCOUNTER — Encounter (HOSPITAL_COMMUNITY): Payer: Self-pay | Admitting: Family

## 2021-05-10 DIAGNOSIS — F411 Generalized anxiety disorder: Secondary | ICD-10-CM

## 2021-05-10 DIAGNOSIS — F331 Major depressive disorder, recurrent, moderate: Secondary | ICD-10-CM

## 2021-05-10 NOTE — Progress Notes (Signed)
Virtual Visit via Video Note  I connected with [name] on 05/10/21 at  9:00 AM EDT by a video enabled telemedicine application and verified that I am speaking with the correct person using two identifiers.  At orientation to the IOP program, Case Manager discussed the limitations of evaluation and management by telemedicine and the availability of in person appointments. The patient expressed understanding and agreed to proceed with virtual visits throughout the duration of the program.   Location:  Patient: Patient Home Provider: Home Office   History of Present Illness: MDD and GAD  Observations/Objective: Check In: Case Manager checked in with all participants to review discharge dates, insurance authorizations, work-related documents and needs from the treatment team regarding medications. Client stated needs and engaged in discussion. Case Manager introduced 1 new Client to the group, with group members welcoming and starting the joining process.  Initial Therapeutic Counselor facilitated a check-in with group members to assess mood and current functioning. Client shared details of their mental health management since our last session, including challenges and successes. Counselor engaged group in discussion, covering the following topics: anger management, self-expression through body art, ADLs impacted by mental health, and maintaining healthy relationships while managing mental health. Client presents with moderate depression and mild anxiety. Client denied any current SI/HI/psychosis.   Second Therapeutic Activity: Counselor introduced [employer], representative with [employer] to share about programming. Group Members asked questions and engaged in discussion, as Trinna Post shared about Peer Support, Support Groups and the [employer]. Client stated that they are interested in connecting with the [employer] group, support group and peer support.  Check Out:   Counselor closed program by allowing time to celebrate 2 graduating group members. Counselor shared reflections on progress and allow space for group members to share well wishes and encouragements with the graduating client. Counselor prompted graduating client to share takeaways, reflect on progress and final thoughts for the group. Client endorsed safety plan to be followed to prevent safety issues.  Assessment and Plan: Clinician recommends that Client step down from IOP to individual therapy, medication management and support groups/peer support. Clinician recommends adherence to crisis/safety plan, taking medications as prescribed, and following up with medical professionals if any issues arise.    Follow Up Instructions: Clinician will send Webex link for next session. The Client was advised to call back or seek an in-person evaluation if the symptoms worsen or if the condition fails to improve as anticipated.     I provided 180 minutes of non-face-to-face time during this encounter.     Hilbert Odor, LCSW

## 2021-05-13 ENCOUNTER — Other Ambulatory Visit: Payer: Self-pay

## 2021-05-13 ENCOUNTER — Institutional Professional Consult (permissible substitution): Payer: BC Managed Care – PPO | Admitting: Neurology

## 2021-05-13 ENCOUNTER — Other Ambulatory Visit (HOSPITAL_COMMUNITY): Payer: BC Managed Care – PPO

## 2021-05-14 ENCOUNTER — Ambulatory Visit (HOSPITAL_COMMUNITY): Payer: BC Managed Care – PPO

## 2021-05-15 ENCOUNTER — Ambulatory Visit (HOSPITAL_COMMUNITY): Payer: BC Managed Care – PPO

## 2021-05-16 ENCOUNTER — Ambulatory Visit (HOSPITAL_COMMUNITY): Payer: BC Managed Care – PPO

## 2021-05-17 ENCOUNTER — Ambulatory Visit (HOSPITAL_COMMUNITY): Payer: BC Managed Care – PPO

## 2021-05-20 ENCOUNTER — Ambulatory Visit (HOSPITAL_COMMUNITY): Payer: BC Managed Care – PPO

## 2021-05-21 ENCOUNTER — Ambulatory Visit (HOSPITAL_COMMUNITY): Payer: BC Managed Care – PPO

## 2021-05-21 NOTE — Psych (Signed)
Virtual Visit via Video Note  I connected with [name] on 04/12/21 at  9:00 AM EDT by a video enabled telemedicine application and verified that I am speaking with the correct person using two identifiers.  Location: Patient: patient home Provider: clinical home office   I discussed the limitations of evaluation and management by telemedicine and the availability of in person appointments. The patient expressed understanding and agreed to proceed.   I discussed the assessment and treatment plan with the patient. The patient was provided an opportunity to ask questions and all were answered. The patient agreed with the plan and demonstrated an understanding of the instructions.   The patient was advised to call back or seek an in-person evaluation if the symptoms worsen or if the condition fails to improve as anticipated.  Pt was provided 240 minutes of non-face-to-face time during this encounter.   Donia Guiles, LCSW    [facility] BH PHP THERAPIST PROGRESS NOTE  [name] [id number]  Session Time: 9:00 - 10:00  Participation Level: Active  Behavioral Response: CasualAlertDepressed  Type of Therapy: Group Therapy  Treatment Goals addressed: Coping  Interventions: CBT, DBT, Supportive, and Reframing  Summary: Clinician led check-in regarding current stressors and situation. Clinician utilized active listening and empathetic response and validated patient emotions. Clinician facilitated processing group on pertinent issues.   Therapist Response: [name] is a 45 y.o. female who presents with depression symptoms. Patient arrived within time allowed and reports that she is feeling "pretty good." Patient rates her mood at a 6 on a scale of 1-10 with 10 being great. Pt reports yesterday went well and she went to the pool and had dinner with her husband. Pt reports her mood elevated by spending positive time with her husband, however it is also confusing for her  because he is hot/cold. Pt states continued issue with sleep and feeling tired. Pt able to process. Pt engaged in discussion.         Session Time: 10:00 - 11:00   Participation Level: Active   Behavioral Response: CasualAlertDepressed   Type of Therapy: Group Therapy   Treatment Goals addressed: Coping   Interventions: CBT, DBT, Supportive and Reframing   Summary: Cln led discussion on communication struggles. Group shared ways in which communication is difficult for them and what are typical barriers. Cln reminded pt's of assertiveness skills and "I" statements. Cln encouaged pt's to consider making notes for points they want to address in a discussion and/or script out what they want to say as a way to decrease anxiety and likeliness that they will get off topic.      Therapist Response: Pt engaged in discussion and reports willingness to apply communication strategies.           Session Time: 11:00- 12:00   Participation Level: Active   Behavioral Response: CasualAlertDepressed   Type of Therapy: Group Therapy   Treatment Goals addressed: Coping   Interventions: CBT, DBT, Supportive and Reframing   Summary: Cln led discussion on healthy aggression substitutes. Cln discussed the benefits to discharging energy and adrenaline when feeling "revved up" in emotion and the importance of balancing that discharge with safety and lack if negative consequences. Group brainstormed different ways to channel agression in a healthy way and shared ways that have worked for them in the past.   Therapist Response: Pt engaged in discussion and identifies 3 options to try.          Session Time: 12:00 -1:00  Participation Level: Active   Behavioral Response: CasualAlertDepressed   Type of Therapy: Group therapy   Treatment Goals addressed: Coping   Interventions: CBT; Solution focused; Supportive; Reframing   Summary: 12:00 - 12:50: Cln led discussion on ways to manage  stressors and feelings over the weekend. Group members  brainstormed things to do over the weekend for multiple levels of energy, access, and moods. Cln reviewed crisis services should they be needed and provided pt's with the text crisis line, mobile crisis, national suicide hotline, [facility] 24/7 line, and information on [facility].    12:50 -1:00 Clinician led check-out. Clinician assessed for immediate needs, medication compliance and efficacy, and safety concerns   Therapist Response: 12:50 - 1:00: Pt engaged in discussion and is able to identify 3 ideas of what to do over the weekend to keep their mind engaged.  12:50 - 1:00: At check-out, patient rates her mood at a 6 on a scale of 1-10 with 10 being great. Pt reports afternoon plans of going to the pool. Pt demonstrates some progress as evidenced by getting out of the house. Patient denies SI/HI at the end of group.      Suicidal/Homicidal: Nowithout intent/plan  Plan: Pt will continue in PHP while working to decrease depression symptoms, increase daily functioning, and increase ability to manage symptoms in a healthy manner.   Diagnosis: Severe episode of recurrent major depressive disorder, without psychotic features (HCC) [F33.2]    1. Severe episode of recurrent major depressive disorder, without psychotic features (HCC)   2. MDD (major depressive disorder), recurrent episode, moderate (HCC)   3. GAD (generalized anxiety disorder)       Donia Guiles, LCSW 05/21/2021

## 2021-05-22 ENCOUNTER — Other Ambulatory Visit: Payer: Self-pay

## 2021-05-22 ENCOUNTER — Other Ambulatory Visit (HOSPITAL_COMMUNITY): Payer: BC Managed Care – PPO

## 2021-05-22 NOTE — Psych (Signed)
Virtual Visit via Video Note  I connected with [name] on 04/16/21 at  3:00 PM EDT by a video enabled telemedicine application and verified that I am speaking with the correct person using two identifiers.  Location: Patient: patient home Provider: clinical home office   I discussed the limitations of evaluation and management by telemedicine and the availability of in person appointments. The patient expressed understanding and agreed to proceed.   I discussed the assessment and treatment plan with the patient. The patient was provided an opportunity to ask questions and all were answered. The patient agreed with the plan and demonstrated an understanding of the instructions.   The patient was advised to call back or seek an in-person evaluation if the symptoms worsen or if the condition fails to improve as anticipated.  Pt was provided 240 minutes of non-face-to-face time during this encounter.   Anita Guiles, LCSW    [facility] BH PHP THERAPIST PROGRESS NOTE  [name] [id number]  Session Time: 9:00 - 10:00  Participation Level: Active  Behavioral Response: CasualAlertDepressed  Type of Therapy: Group Therapy  Treatment Goals addressed: Coping  Interventions: CBT, DBT, Supportive, and Reframing  Summary: Clinician led check-in regarding current stressors and situation. Clinician utilized active listening and empathetic response and validated patient emotions. Clinician facilitated processing group on pertinent issues.   Therapist Response: [name] is a 45 y.o. female who presents with depression symptoms. Patient arrived within time allowed and reports that she is feeling "down and anxious." Patient rates her mood at a 2 on a scale of 1-10 with 10 being great. Pt reports her weekend was "rough" and her mom is having health issues. Pt states feeling "blind sided" this morning by something her husband put on social media re: their separation. Pt reports  continued issues with sleep and not feeling rested. Pt reports feeling hopeless. Pt able to process. Pt engaged in discussion.         Session Time: 10:00 - 11:00   Participation Level: Active   Behavioral Response: CasualAlertDepressed   Type of Therapy: Group Therapy   Treatment Goals addressed: Coping   Interventions: CBT, DBT, Supportive and Reframing   Summary: Cln led processing group for pt's current struggles. Group members shared stressors and provided support and feedback. Cln brought in topics of boundaries, healthy relationships, and unhealthy thought processes to inform discussion.      Therapist Response: Pt able to process and provide support to group.          Session Time: 11:00- 12:00   Participation Level: Active   Behavioral Response: CasualAlertDepressed   Type of Therapy: Group Therapy, OT   Treatment Goals addressed: Coping   Interventions: Psychosocial skills training, Supportive   Summary: Occupational Therapy group   Therapist Response: Patient engaged in group. See OT note.           Session Time: 12:00 -1:00   Participation Level: Active   Behavioral Response: CasualAlertDepressed   Type of Therapy: Group therapy   Treatment Goals addressed: Coping   Interventions: CBT; Solution focused; Supportive; Reframing   Summary: 12:00 - 12:50: Cln introduced topic of boundaries. Cln discussed how boundaries inform our relationships and affect self-esteem and personal agency. Group discussed the three types of boundaries: rigid, porous, and healthy and when each type is most helpful/harmful.  12:50 -1:00 Clinician led check-out. Clinician assessed for immediate needs, medication compliance and efficacy, and safety concerns   Therapist Response: 12:50 - 1:00: Pt  engaged in discussion and reports having mostly porous boundaries.  12:50 - 1:00: At check-out, patient rates her mood at a 2 on a scale of 1-10 with 10 being great. Pt reports  afternoon plans of helping her mom. Pt demonstrates some progress as evidenced by increased openness. Patient denies SI/HI at the end of group.    Suicidal/Homicidal: Nowithout intent/plan  Plan: Pt will continue in PHP while working to decrease depression symptoms, increase daily functioning, and increase ability to manage symptoms in a healthy manner.   Diagnosis: Severe episode of recurrent major depressive disorder, without psychotic features (HCC) [F33.2]    1. Severe episode of recurrent major depressive disorder, without psychotic features (HCC)       Anita Guiles, LCSW 05/22/2021

## 2021-05-23 ENCOUNTER — Ambulatory Visit (HOSPITAL_COMMUNITY): Payer: BC Managed Care – PPO

## 2021-05-24 ENCOUNTER — Ambulatory Visit (HOSPITAL_COMMUNITY): Payer: BC Managed Care – PPO

## 2021-05-28 ENCOUNTER — Other Ambulatory Visit: Payer: Self-pay | Admitting: "Endocrinology

## 2021-05-28 DIAGNOSIS — Z79899 Other long term (current) drug therapy: Secondary | ICD-10-CM | POA: Diagnosis not present

## 2021-05-28 DIAGNOSIS — E559 Vitamin D deficiency, unspecified: Secondary | ICD-10-CM | POA: Diagnosis not present

## 2021-05-28 DIAGNOSIS — E039 Hypothyroidism, unspecified: Secondary | ICD-10-CM | POA: Diagnosis not present

## 2021-05-28 DIAGNOSIS — E785 Hyperlipidemia, unspecified: Secondary | ICD-10-CM | POA: Diagnosis not present

## 2021-05-28 NOTE — Psych (Signed)
Virtual Visit via Video Note  I connected with [name] on 04/19/21 at  9:00 AM EDT by a video enabled telemedicine application and verified that I am speaking with the correct person using two identifiers.  Location: Patient: patient home Provider: clinical home office   I discussed the limitations of evaluation and management by telemedicine and the availability of in person appointments. The patient expressed understanding and agreed to proceed.   I discussed the assessment and treatment plan with the patient. The patient was provided an opportunity to ask questions and all were answered. The patient agreed with the plan and demonstrated an understanding of the instructions.   The patient was advised to call back or seek an in-person evaluation if the symptoms worsen or if the condition fails to improve as anticipated.  Pt was provided 240 minutes of non-face-to-face time during this encounter.   Donia Guiles, LCSW    [facility] BH PHP THERAPIST PROGRESS NOTE  [name] [id number]  Session Time: 9:00 - 10:00  Participation Level: Active  Behavioral Response: CasualAlertDepressed  Type of Therapy: Group Therapy  Treatment Goals addressed: Coping  Interventions: CBT, DBT, Supportive, and Reframing  Summary: Clinician led check-in regarding current stressors and situation. Clinician utilized active listening and empathetic response and validated patient emotions. Clinician facilitated processing group on pertinent issues.   Therapist Response: [name] is a 45 y.o. female who presents with depression symptoms. Patient arrived within time allowed and reports that she is feeling "tired." Patient rates her mood at a 4 on a scale of 1-10 with 10 being great. Pt reports her mother's health continues to be a stressor and she spent a few hours at the hospital yesterday. Pt reports she continues to not rest well and had broken sleep. Pt reports struggles with stress  and guilt. Pt able to process. Pt engaged in discussion.         Session Time: 10:00 - 11:00   Participation Level: Active   Behavioral Response: CasualAlertDepressed   Type of Therapy: Group Therapy   Treatment Goals addressed: Coping   Interventions: CBT, DBT, Supportive and Reframing   Summary: Cln introduced DBT concept of radical acceptance. Cln discussed radical acceptance as a strategy to decrease distress and to manage situations outside of their control. Group volunteered struggles they are experiencing with control and cln helped group apply radical acceptance.      Therapist Response: Pt engaged in discussion and identified ways she can apply radical acceptance in her life.           Session Time: 11:00- 12:00   Participation Level: Active   Behavioral Response: CasualAlertDepressed   Type of Therapy: Group Therapy, OT   Treatment Goals addressed: Coping   Interventions: Psychosocial skills training, Supportive   Summary: Occupational Therapy group   Therapist Response: Patient engaged in group. See OT note.           Session Time: 12:00 -1:00   Participation Level: Active   Behavioral Response: CasualAlertDepressed   Type of Therapy: Group therapy   Treatment Goals addressed: Coping   Interventions: CBT; Solution focused; Supportive; Reframing   Summary: 12:00 - 12:50: Cln continued topic of boundaries. Cln discussed material and time boundaries including they way they present and difficulties with both. Group members discussed the ways in which material and time boundaries is a struggle for them.  12:50 -1:00 Clinician led check-out. Clinician assessed for immediate needs, medication compliance and efficacy, and safety concerns  Therapist Response: 12:50 - 1:00: Pt engaged in discussion and is able to identify ways in which material and time boundaries affect them. 12:50 - 1:00: At check-out, patient rates her mood at a 4 on a scale of 1-10  with 10 being great. Pt reports afternoon plans of being with her mom. Pt demonstrates some progress as evidenced by continuing to come to group. Patient denies SI/HI at the end of group.    Suicidal/Homicidal: Nowithout intent/plan  Plan: Pt will continue in PHP while working to decrease depression symptoms, increase daily functioning, and increase ability to manage symptoms in a healthy manner.   Diagnosis: Severe episode of recurrent major depressive disorder, without psychotic features (HCC) [F33.2]    1. Severe episode of recurrent major depressive disorder, without psychotic features (HCC)       Donia Guiles, LCSW 05/28/2021

## 2021-05-28 NOTE — Psych (Signed)
Virtual Visit via Video Note  I connected with [name] on 04/22/21 at  9:00 AM EDT by a video enabled telemedicine application and verified that I am speaking with the correct person using two identifiers.  Location: Patient: patient home Provider: clinical home office   I discussed the limitations of evaluation and management by telemedicine and the availability of in person appointments. The patient expressed understanding and agreed to proceed.   I discussed the assessment and treatment plan with the patient. The patient was provided an opportunity to ask questions and all were answered. The patient agreed with the plan and demonstrated an understanding of the instructions.   The patient was advised to call back or seek an in-person evaluation if the symptoms worsen or if the condition fails to improve as anticipated.  Pt was provided 240 minutes of non-face-to-face time during this encounter.   Donia Guiles, LCSW    [facility] BH PHP THERAPIST PROGRESS NOTE  [name] [id number]  Session Time: 9:00 - 10:30  Participation Level: Active  Behavioral Response: CasualAlertDepressed  Type of Therapy: Group Therapy  Treatment Goals addressed: Coping  Interventions: CBT, DBT, Supportive, and Reframing  Summary: Clinician led check-in regarding current stressors and situation. Clinician utilized active listening and empathetic response and validated patient emotions. Clinician facilitated processing group on pertinent issues.   Therapist Response: [name] is a 45 y.o. female who presents with depression symptoms. Patient arrived within time allowed and reports that she is feeling "very sleepy." Patient rates her mood at a 4 on a scale of 1-10 with 10 being great. Pt reports her mother is doing some better and help is beginning today to take some responsibility off pt. Pt states she stayed at home when she wasn't with her mom and rested and did chores. Pt reports  continued struggle with fatigue despite how much rest she gets.  Pt able to process. Pt engaged in discussion.        Session Time: 10:30- 11:30   Participation Level: Active   Behavioral Response: CasualAlertDepressed   Type of Therapy: Group Therapy, OT   Treatment Goals addressed: Coping   Interventions: Psychosocial skills training, Supportive   Summary: Occupational Therapy group   Therapist Response: Patient engaged in group. See OT note.           Session Time: 1:30 -2:00   Participation Level: Active   Behavioral Response: CasualAlertDepressed   Type of Therapy: Individual therapy   Treatment Goals addressed: Coping   Interventions: CBT; Solution focused; Supportive; Reframing   Summary: Cln led individual session via phone. Cln discussed pt's progress, continued barriers, and ways to problem solve those barriers. Cln discussed discharge plan.    Therapist Response: Pt reports she is able to see progress AEB improved coping ability. Pt reports barrier of fatigue and remembering strategies to utilize. Pt reports high anxiety regarding returning to work and feels she has not made sufficient progress to discharge. Pt reports comfort with continuing in group through the week and re-assessing next week. Pt denies questions or concerns. Pt denies current SI.     Suicidal/Homicidal: Nowithout intent/plan  Plan: Pt will continue in PHP while working to decrease depression symptoms, increase daily functioning, and increase ability to manage symptoms in a healthy manner.   Diagnosis: Severe episode of recurrent major depressive disorder, without psychotic features (HCC) [F33.2]    1. Severe episode of recurrent major depressive disorder, without psychotic features (HCC)  Donia Guiles, LCSW 05/28/2021

## 2021-05-28 NOTE — Psych (Signed)
Virtual Visit via Video Note  I connected with [name] on 04/17/21 at  9:00 AM EDT by a video enabled telemedicine application and verified that I am speaking with the correct person using two identifiers.  Location: Patient: patient home Provider: clinical home office   I discussed the limitations of evaluation and management by telemedicine and the availability of in person appointments. The patient expressed understanding and agreed to proceed.   I discussed the assessment and treatment plan with the patient. The patient was provided an opportunity to ask questions and all were answered. The patient agreed with the plan and demonstrated an understanding of the instructions.   The patient was advised to call back or seek an in-person evaluation if the symptoms worsen or if the condition fails to improve as anticipated.  Pt was provided 240 minutes of non-face-to-face time during this encounter.   Donia Guiles, LCSW    [facility] BH PHP THERAPIST PROGRESS NOTE  [name] [id number]  Session Time: 9:00 - 10:00  Participation Level: Active  Behavioral Response: CasualAlertDepressed  Type of Therapy: Group Therapy  Treatment Goals addressed: Coping  Interventions: CBT, DBT, Supportive, and Reframing  Summary: Clinician led check-in regarding current stressors and situation. Clinician utilized active listening and empathetic response and validated patient emotions. Clinician facilitated processing group on pertinent issues.   Therapist Response: [name] is a 45 y.o. female who presents with depression symptoms. Patient arrived within time allowed and reports that she is feeling "not good." Patient rates her mood at a 2 on a scale of 1-10 with 10 being great. Pt reports ongoing issues with her husband and their separation. Pt states her mother continues to be in poor health. Pt reports high stress. Pt reports continued issues with sleep and feeling "exhausted."  Pt reports feeling like "I wish I wasn't around." Pt able to process. Pt engaged in discussion.         Session Time: 10:00 - 11:00   Participation Level: Active   Behavioral Response: CasualAlertDepressed   Type of Therapy: Group Therapy   Treatment Goals addressed: Coping   Interventions: CBT, DBT, Supportive and Reframing   Summary: Cln led discussion on "why" and "what now" in terms of how we focus on our problems. Group members shared how focus on "why" has impacted them and barriers to focusing on "what now." Group able to process. Cln encouraged pt's to consider what "why" accomplishes.      Therapist Response: Pt engaged in discussion and reports she struggles with the "why."           Session Time: 11:00 - 12:00   Participation Level: Active   Behavioral Response: CasualAlertDepressed   Type of Therapy: Group Therapy   Treatment Goals addressed: Coping   Interventions: Supportive, Reframing   Summary: Spiritual Care group   Therapist Response: Pt engaged in session. See chaplain note           Session Time: 12:00 -1:00   Participation Level: Active   Behavioral Response: CasualAlertDepressed   Type of Therapy: Group therapy   Treatment Goals addressed: Coping   Interventions: Psychosocial skills training, Supportive   Summary: 12:00 - 12:50: Occupational Therapy group 12:50 -1:00 Clinician led check-out. Clinician assessed for immediate needs, medication compliance and efficacy, and safety concerns   Therapist Response: 12:00 - 12:50: Patient engaged in group. See OT note.  12:50 - 1:00: At check-out, patient rates her mood at a 2 on a scale of 1-10  with 10 being great. Pt reports afternoon plans of resting. Pt demonstrates some progress as evidenced by increased effort to take care of herself. Patient denies SI/HI at the end of group.    Suicidal/Homicidal: Nowithout intent/plan  Plan: Pt will continue in PHP while working to decrease  depression symptoms, increase daily functioning, and increase ability to manage symptoms in a healthy manner.   Diagnosis: Severe episode of recurrent major depressive disorder, without psychotic features (HCC) [F33.2]    1. Severe episode of recurrent major depressive disorder, without psychotic features (HCC)   2. GAD (generalized anxiety disorder)       Donia Guiles, LCSW 05/28/2021

## 2021-05-29 LAB — T4, FREE: Free T4: 1.09 ng/dL (ref 0.82–1.77)

## 2021-05-29 LAB — TSH: TSH: 8.14 u[IU]/mL — ABNORMAL HIGH (ref 0.450–4.500)

## 2021-05-30 ENCOUNTER — Telehealth (INDEPENDENT_AMBULATORY_CARE_PROVIDER_SITE_OTHER): Payer: BC Managed Care – PPO | Admitting: Psychiatry

## 2021-05-30 ENCOUNTER — Other Ambulatory Visit: Payer: Self-pay

## 2021-05-30 DIAGNOSIS — R454 Irritability and anger: Secondary | ICD-10-CM

## 2021-05-30 DIAGNOSIS — F411 Generalized anxiety disorder: Secondary | ICD-10-CM

## 2021-05-30 DIAGNOSIS — F331 Major depressive disorder, recurrent, moderate: Secondary | ICD-10-CM | POA: Diagnosis not present

## 2021-05-30 MED ORDER — VENLAFAXINE HCL ER 75 MG PO CP24: 225.0000 mg | ORAL_CAPSULE | Freq: Every day | ORAL | 0 refills | Status: DC

## 2021-05-30 MED ORDER — ALPRAZOLAM 0.5 MG PO TABS: 0.5000 mg | ORAL_TABLET | Freq: Every evening | ORAL | 1 refills | Status: DC | PRN

## 2021-05-30 MED ORDER — BUSPIRONE HCL 10 MG PO TABS: 20.0000 mg | ORAL_TABLET | Freq: Two times a day (BID) | ORAL | 0 refills | Status: DC

## 2021-05-30 MED ORDER — PROPRANOLOL HCL 10 MG PO TABS: 10.0000 mg | ORAL_TABLET | Freq: Every day | ORAL | 1 refills | Status: DC | PRN

## 2021-05-30 MED ORDER — ZIPRASIDONE HCL 40 MG PO CAPS: 40.0000 mg | ORAL_CAPSULE | Freq: Two times a day (BID) | ORAL | 1 refills | Status: DC

## 2021-05-30 NOTE — Progress Notes (Signed)
Virtual Visit via Telephone Note  I connected with [name] on 05/30/21 at  8:30 AM EDT by telephone and verified that I am speaking with the correct person using two identifiers.  Location: Patient: home Provider: office   I discussed the limitations, risks, security and privacy concerns of performing an evaluation and management service by telephone and the availability of in person appointments. I also discussed with the patient that there may be a patient responsible charge related to this service. The patient expressed understanding and agreed to proceed.   History of Present Illness: [name] is overwhelmed. Her mother is moving in today. Her mom's health has declined to the point where she is unable to live alone and needs 24/7 care. Her husband is going to care of her mom while [name] is at work. He is moving out and she is going to pay him to come and care for her mom. After work and off days [name] will take care of her. [name] is trying to manage all the pieces of her mom's move. She is trying to do paperwork but it is hard to concentrate and it makes her very anxious. [name] is worried about how she will manage her mom's physical needs. [name] is worried about hurting herself and manage her own household chores. The whole thing is terrifying. Work stress is ongoing. Her anxiety is at all time high and she keeps thinking about what could go wrong. Due to being overwhelmed she is getting depressed. She has a low level of depression on a daily basis. She can't afford to get depressed or anxious right now. Her sleep is variable. She takes Xanax at bedtime because it makes her sleepy. It helps to calm her. Due to sedation she does not take Xanax during the day. Some nights she doesn't sleep well due to anxiety. Her energy is low. She is going to have a sleep study in October to due ongoing daytime fatigue.  Her appetite is variable. Some days she stays in bed all day and doesn't eat much.  Other days she is starving and will eat a lot just to stay awake. [name] feels the meds and coping skills are not helping due to worsening situational stressors. Ultimately she thinks the only thing that will help is decrease in outside stressors. She would have thought that with all of her meds she would just feel numb but she isn't because of another new stressor. [name] has not restarted individual therapy because her work schedule is not set. She has frequent thoughts that she would be better off dead at least Feb 15, 2023 days a week. She denies SI/HI.   Observations/Objective:  General Appearance: unable to assess  Eye Contact:  unable to assess  Speech:  Clear and Coherent and Slow  Volume:  Normal  Mood:  Anxious and Depressed  Affect:  Congruent  Thought Process:  Goal Directed, Linear, and Descriptions of Associations: Intact  Orientation:  Full (Time, Place, and Person)  Thought Content:  Rumination  Suicidal Thoughts:  No  Homicidal Thoughts:  No  Memory:  Immediate;   Good  Judgement:  Fair  Insight:  Good  Psychomotor Activity: unable to assess  Concentration:  Concentration: Fair  Recall:  Poor  Fund of Knowledge:  Good  Language:  Good  Akathisia:  unable to assess  Handed:  unable to assess  AIMS (if indicated):     Assets:  Communication Skills Desire for Improvement Network engineer  ADL's:  unable to assess  Cognition:  WNL  Sleep:        Assessment and Plan: Depression screen [facility] 2/9 05/30/2021 05/01/2021 04/18/2021 04/17/2021 04/04/2021  Decreased Interest 3 2 2 3 3   Down, Depressed, Hopeless 3 3 3 3 3   PHQ - 2 Score 6 5 5 6 6   Altered sleeping 2 0 0 3 0  Tired, decreased energy 3 3 3 3 3   Change in appetite 3 1 1 3 3   Feeling bad or failure about yourself  3 3 3 3 3   Trouble concentrating 3 1 1 1 3   Moving slowly or fidgety/restless 2 3 3 1 2   Suicidal thoughts 1 2 2 2 3   PHQ-9 Score 23  18 18 22 23   Difficult doing work/chores Extremely dIfficult Extremely dIfficult Extremely dIfficult Extremely dIfficult Extremely dIfficult  Some recent data might be hidden    Flowsheet Row [facility] from [facility]01[facility]2021 [facility] Video Visit from 04/18/2021 in [facility]  C-SSRS RISK CATEGORY Error: Q3, 4, or 5 should not be populated when Q2 is No Error: Q3, 4, or 5 should not be populated when Q2 is No Error: Q3, 4, or 5 should not be populated when Q2 is No        - encouraged to start therapy when possible. She has not been able to do so far due to her changing work schedule.  -start trial of Propranolol 10mg  qD prn anxiety  1. GAD (generalized anxiety disorder) - propranolol (INDERAL) 10 MG tablet; Take 1 tablet (10 mg total) by mouth daily as needed (anxiety).  Dispense: 30 tablet; Refill: 1 - ALPRAZolam (XANAX) 0.5 MG tablet; Take 1 tablet (0.5 mg total) by mouth at bedtime as needed for anxiety or sleep.  Dispense: 30 tablet; Refill: 1 - busPIRone (BUSPAR) 10 MG tablet; Take 2 tablets (20 mg total) by mouth 2 (two) times daily.  Dispense: 360 tablet; Refill: 0 - venlafaxine XR (EFFEXOR-XR) 75 MG 24 hr capsule; Take 3 capsules (225 mg total) by mouth daily with breakfast.  Dispense: 270 capsule; Refill: 0 - ziprasidone (GEODON) 40 MG capsule; Take 1 capsule (40 mg total) by mouth 2 (two) times daily with a meal.  Dispense: 60 capsule; Refill: 1  2. MDD (major depressive disorder), recurrent episode, moderate (HCC) - busPIRone (BUSPAR) 10 MG tablet; Take 2 tablets (20 mg total) by mouth 2 (two) times daily.  Dispense: 360 tablet; Refill: 0 - venlafaxine XR (EFFEXOR-XR) 75 MG 24 hr capsule; Take 3 capsules (225 mg total) by mouth daily with breakfast.  Dispense: 270 capsule; Refill: 0 - ziprasidone (GEODON) 40 MG capsule; Take 1 capsule (40 mg  total) by mouth 2 (two) times daily with a meal.  Dispense: 60 capsule; Refill: 1  3. Irritability and anger - ziprasidone (GEODON) 40 MG capsule; Take 1 capsule (40 mg total) by mouth 2 (two) times daily with a meal.  Dispense: 60 capsule; Refill: 1    Follow Up Instructions: In 1-2 months or sooner if needed   I discussed the assessment and treatment plan with the patient. The patient was provided an opportunity to ask questions and all were answered. The patient agreed with the plan and demonstrated an understanding of the instructions.   The patient was advised to call back or seek an in-person evaluation if the symptoms worsen or if the condition fails to improve as anticipated.  I provided 29 minutes of non-face-to-face time during this encounter.   [name], MD

## 2021-06-04 ENCOUNTER — Encounter: Payer: Self-pay | Admitting: "Endocrinology

## 2021-06-04 ENCOUNTER — Other Ambulatory Visit: Payer: Self-pay

## 2021-06-04 ENCOUNTER — Ambulatory Visit (INDEPENDENT_AMBULATORY_CARE_PROVIDER_SITE_OTHER): Payer: BC Managed Care – PPO | Admitting: "Endocrinology

## 2021-06-04 VITALS — BP 114/78 | HR 92 | Ht 64.0 in | Wt >= 6400 oz

## 2021-06-04 DIAGNOSIS — E559 Vitamin D deficiency, unspecified: Secondary | ICD-10-CM

## 2021-06-04 DIAGNOSIS — E039 Hypothyroidism, unspecified: Secondary | ICD-10-CM | POA: Diagnosis not present

## 2021-06-04 MED ORDER — LEVOTHYROXINE SODIUM 100 MCG PO TABS: 100.0000 ug | ORAL_TABLET | Freq: Every day | ORAL | 1 refills | Status: DC

## 2021-06-04 NOTE — Progress Notes (Signed)
06/04/2021, 12:29 PM  Endocrinology follow-up note   [name] is a 44 y.o.-year-old female patient being seen in follow-up for management of hypothyroidism, also interested in discussing weight loss options.   Referred by: Anita Gravel, MD.   Past Medical History:  Diagnosis Date   Allergy    Anxiety    Arthritis    Asthma    Breast nodule 01/02/2015   Depression    Fatigue    GERD (gastroesophageal reflux disease)    Herpes    HPV in female    Obesity    PMDD (premenstrual dysphoric disorder)    Thyroid disease    hypothryoidism   Trauma    Vaginal Pap smear, abnormal    Vitamin D deficiency     Past Surgical History:  Procedure Laterality Date   CESAREAN SECTION     CHOLECYSTECTOMY     TONSILECTOMY/ADENOIDECTOMY WITH MYRINGOTOMY     WISDOM TOOTH EXTRACTION      Social History   Socioeconomic History   Marital status: Married    Spouse name: Not on file   Number of children: 2   Years of education: Not on file   Highest education level: Associate degree: occupational, Hotel manager, or vocational program  Occupational History   Not on file  Tobacco Use   Smoking status: Former   Smokeless tobacco: Never   Tobacco comments:    light smoker as a teenager   Scientific laboratory technician Use: Never used  Substance and Sexual Activity   Alcohol use: Yes    Comment: occ   Drug use: No   Sexual activity: Yes    Birth control/protection: Pill  Other Topics Concern   Not on file  Social History Narrative   Social Hx:   Current living situation- living in [place] with husband, son is 50/50 custody with biological father, daughter lives with pt's mom in [place]. Pt lives close to her mom   Raised in [place] by mom and dad   Siblings- 2 brothers and pt is the youngest. She is 57 yrs younger than her youngest brother   Schooling- associates degree in  early childhood   Married- yes - currently in 31rd marriage   Kids- 2 from 15st marriage      Legal issues- denies   Social Determinants of Radio broadcast assistant Strain: Not on file  Food Insecurity: Not on file  Transportation Needs: Not on file  Physical Activity: Not on file  Stress: Not on file  Social Connections: Not on file    Family History  Problem Relation Age of Onset   Hypertension Mother    Arthritis Mother    Anxiety disorder Mother    Asthma Father    COPD Father    Arthritis Father    Chronic bronchitis Father    Hypertension Father  Hyperlipidemia Father    Anxiety disorder Father    Depression Father    Alcohol abuse Father    Drug abuse Brother    Alcohol abuse Brother    Hypertension Brother    Mental illness Brother    Depression Brother    Cancer Maternal Aunt    Cancer Maternal Uncle    Cancer Paternal Aunt    Cancer Paternal Uncle    Asthma Maternal Grandmother    Arthritis Maternal Grandmother    Hypertension Maternal Grandmother    Congestive Heart Failure Maternal Grandmother    Diabetes Maternal Grandfather    Stroke Maternal Grandfather    ADD / ADHD Son    Hyperlipidemia Brother    Hearing loss Brother    Asthma Brother    Learning disabilities Brother     Outpatient Encounter Medications as of 06/04/2021  Medication Sig   Cholecalciferol (VITAMIN D3 PO) Take 1 tablet by mouth daily in the afternoon.   ALPRAZolam (XANAX) 0.5 MG tablet Take 1 tablet (0.5 mg total) by mouth at bedtime as needed for anxiety or sleep.   augmented betamethasone dipropionate (DIPROLENE-AF) 0.05 % cream    busPIRone (BUSPAR) 10 MG tablet Take 2 tablets (20 mg total) by mouth 2 (two) times daily.   cetirizine (ZYRTEC) 10 MG tablet Take 10 mg by mouth daily.   ibuprofen (ADVIL) 600 MG tablet Take 600 mg by mouth 3 (three) times daily. As needed   levalbuterol (XOPENEX HFA) 45 MCG/ACT inhaler Inhale into the lungs every 4 (four) hours as needed for  wheezing.   levothyroxine (SYNTHROID) 100 MCG tablet Take 1 tablet (100 mcg total) by mouth daily before breakfast.   levothyroxine (SYNTHROID) 200 MCG tablet TAKE 1 TABLET(200 MCG) BY MOUTH DAILY BEFORE AND BREAKFAST   MELATONIN PO Take by mouth at bedtime.   Misc Natural Products (CRANBERRY/PROBIOTIC PO) Take by mouth daily. Cranberry/Probiotic/Prebiotic Blend-2 daily   montelukast (SINGULAIR) 10 MG tablet Take 10 mg by mouth daily.    Multiple Vitamins-Minerals (WOMENS MULTI VITAMIN & MINERAL PO) Take by mouth daily.   norethindrone (AYGESTIN) 5 MG tablet TAKE 2 TABLETS BY MOUTH DAILY   propranolol (INDERAL) 10 MG tablet Take 1 tablet (10 mg total) by mouth daily as needed (anxiety).   venlafaxine XR (EFFEXOR-XR) 75 MG 24 hr capsule Take 3 capsules (225 mg total) by mouth daily with breakfast.   ziprasidone (GEODON) 40 MG capsule Take 1 capsule (40 mg total) by mouth 2 (two) times daily with a meal.   [DISCONTINUED] calcium-vitamin D (OSCAL WITH D) 500-200 MG-UNIT tablet Take 1 tablet by mouth.   [DISCONTINUED] levothyroxine (SYNTHROID) 75 MCG tablet TAKE 1 TABLET(75 MCG) BY MOUTH DAILY   No facility-administered encounter medications on file as of 06/04/2021.    ALLERGIES: Allergies  Allergen Reactions   Latex    Naproxen    Shrimp [Shellfish Allergy] Other (See Comments)    Intolerance    VACCINATION STATUS: Immunization History  Administered Date(s) Administered   Moderna Sars-Covid-2 Vaccination 12/12/2019, 01/11/2020     HPI    [name]  is a patient with the above medical history. she was diagnosed  with hypothyroidism at approximate age of 26 years which required treatment with levothyroxine.  Etiology of her hypothyroidism was recently confirmed to be Hashimoto's thyroiditis. -She is currently on levothyroxine 275 mcg p.o. daily before breakfast.  She reports compliance with medication.  Her previsit thyroid function tests are consistent with inadequate  replacement.  -Her major  concern remains to be inability to lose weight.  She continues to gain weight.    See notes from previous visits.     She also has chronic complaints of fatigue, depression, mood swings/disorder.  She is currently on mood stabilizers, antidepressants.  Patient with prior history of eating disorders.   she has family history of  thyroid disorders in her uncle who has an identified thyroid dysfunction.  No family history of thyroid cancer.  No history of  radiation therapy to head or neck. No recent use of iodine supplements.  She is on polypharmacy including psychotropics: BuSpar 15 mg twice daily, Depakote 500 mg p.o. daily, Effexor 225 mg p.o. daily.   Reportedly, she underwent sleep studies which did not reveal sleep apnea.   ROS: Limited as above.   Physical Exam: BP 114/78   Pulse 92   Ht $R'5\' 4"'gW$  (1.626 m)   Wt (!) 522 lb (236.8 kg)   BMI 89.60 kg/m  Wt Readings from Last 3 Encounters:  06/04/21 (!) 522 lb (236.8 kg)  09/18/20 (!) 500 lb (226.8 kg)  07/31/20 (!) 500 lb 6.4 oz (227 kg)     CMP ( most recent) CMP     Component Value Date/Time   NA 137 11/14/2009 1255   K 3.7 11/14/2009 1255   CL 102 11/14/2009 1255   CO2 29 11/14/2009 1255   GLUCOSE 89 11/14/2009 1255   BUN 5 (L) 11/14/2009 1255   CREATININE 0.71 11/14/2009 1255   CALCIUM 9.2 11/14/2009 1255   GFRNONAA >60 11/14/2009 1255   GFRAA  11/14/2009 1255    >60        The eGFR has been calculated using the MDRD equation. This calculation has not been validated in all clinical situations. eGFR's persistently <60 mL/min signify possible Chronic Kidney Disease.   Recent Results (from the past 2160 hour(s))  TSH     Status: Abnormal   Collection Time: 05/28/21  8:32 AM  Result Value Ref Range   TSH 8.140 (H) 0.450 - 4.500 uIU/mL  T4, free     Status: None   Collection Time: 05/28/21  8:32 AM  Result Value Ref Range   Free T4 1.09 0.82 - 1.77 ng/dL    Thyroid ultrasound  on July 26, 2020: Right lobe 4.8 cm with no nodules. Left lobe 4.5 cm with no nodules.   ASSESSMENT: 1. Hypothyroidism 2.  Morbid obesity 3.  Vitamin D  Deficiency 4.  Hypertension  PLAN:  The etiology of her hypothyroidism is confirmed to be Hashimoto's thyroiditis.  Her ultrasound is unremarkable, will not need any biopsy or surgery at this time. -Her previsit thyroid function tests are consistent with inadequate replacement.  I discussed and increased her levothyroxine to 300 mcg p.o. daily before breakfast.      - We discussed about the correct intake of her thyroid hormone, on empty stomach at fasting, with water, separated by at least 30 minutes from breakfast and other medications,  and separated by more than 4 hours from calcium, iron, multivitamins, acid reflux medications (PPIs). -Patient is made aware of the fact that thyroid hormone replacement is needed for life, dose to be adjusted by periodic monitoring of thyroid function tests.    Vitamin D deficiency: She is advised to continue her vitamin D supplements with vitamin D3 5000 units daily.  She is also on Os-Cal 500-200 mg.  Daily. She will have repeat 25-hydroxy vitamin D level measured before her next visit. [place]  her  morbid obesity: Her a.m. cortisol is normal. This is the most significant health risk for her.  Her weight gain seems to be driven by excessive caloric intake worsened by slowed metabolism likely due to perimenopausal syndrome.   She will still benefit from dietary management to lose weight, and she was interested to explore her options.  She is not a suitable candidate for pharmacologic agents for weight management.  However, patient is a good candidate for surgical weight management.  She was hesitant during her last visit, more open this visit.  I gave her information brochure and contact numbers to initiate the process of  weight loss surgery at [facility] bariat[facility] program.  - she  acknowledges that there is a room for improvement in her food and drink choices. - Suggestion is made for her to avoid simple carbohydrates  from her diet including Cakes, Sweet Desserts, Ice Cream, Soda (diet and regular), Sweet Tea, Candies, Chips, Cookies, Store Bought Juices, Alcohol in Excess of  1-2 drinks a day, Artificial Sweeteners,  Coffee Creamer, and "Sugar-free" Products, Lemonade. This will help patient to have more stable blood glucose profile and potentially avoid unintended weight gain.  She is advised to maintain close follow-up with her PMD.   I spent 22 minutes in the care of the patient today including review of labs from Thyroid Function, CMP, and other relevant labs ; imaging/biopsy records (current and previous including abstractions from other facilities); face-to-face time discussing  her lab results and symptoms, medications doses, her options of short and long term treatment based on the latest standards of care / guidelines;   and documenting the encounter.  Roberts Gaudy Kujawa  participated in the discussions, expressed understanding, and voiced agreement with the above plans.  All questions were answered to her satisfaction. she is encouraged to contact clinic should she have any questions or concerns prior to her return visit.    Return in about 6 months (around 12/05/2021) for F/U with Pre-visit Labs.  Glade Lloyd, MD [facility] [address] [place],  [address] Phone: [phone]  Fax: [phone]   06/04/2021, 12:29 PM  This note was partially dictated with voice recognition software. Similar sounding words can be transcribed inadequately or may not  be corrected upon review.

## 2021-06-05 DIAGNOSIS — E039 Hypothyroidism, unspecified: Secondary | ICD-10-CM | POA: Diagnosis not present

## 2021-06-05 DIAGNOSIS — R400 Somnolence: Secondary | ICD-10-CM | POA: Diagnosis not present

## 2021-06-05 DIAGNOSIS — E785 Hyperlipidemia, unspecified: Secondary | ICD-10-CM | POA: Diagnosis not present

## 2021-06-05 DIAGNOSIS — R531 Weakness: Secondary | ICD-10-CM | POA: Diagnosis not present

## 2021-06-10 ENCOUNTER — Other Ambulatory Visit (HOSPITAL_COMMUNITY): Payer: Self-pay | Admitting: Adult Health

## 2021-06-10 DIAGNOSIS — Z1231 Encounter for screening mammogram for malignant neoplasm of breast: Secondary | ICD-10-CM

## 2021-06-10 NOTE — Psych (Signed)
Virtual Visit via Video Note  I connected with [name] on 04/24/21 at  9:00 AM EDT by a video enabled telemedicine application and verified that I am speaking with the correct person using two identifiers.  Location: Patient: patient home Provider: clinical home office   I discussed the limitations of evaluation and management by telemedicine and the availability of in person appointments. The patient expressed understanding and agreed to proceed.   I discussed the assessment and treatment plan with the patient. The patient was provided an opportunity to ask questions and all were answered. The patient agreed with the plan and demonstrated an understanding of the instructions.   The patient was advised to call back or seek an in-person evaluation if the symptoms worsen or if the condition fails to improve as anticipated.  Pt was provided 60 minutes of non-face-to-face time during this encounter.   Anita Guiles, LCSW    [facility] PHP THERAPIST PROGRESS NOTE  [name] [id number]  Session Time: 9:00 - 10:00  Participation Level: Active  Behavioral Response: CasualAlertDepressed  Type of Therapy: Group Therapy  Treatment Goals addressed: Coping  Interventions: CBT, DBT, Supportive, and Reframing  Summary: Clinician led check-in regarding current stressors and situation. Clinician utilized active listening and empathetic response and validated patient emotions. Clinician facilitated processing group on pertinent issues.   Therapist Response: [name] is a 45 y.o. female who presents with depression symptoms. Patient arrived within time allowed and reports that she is feeling "bad." Patient rates her mood at a 3 on a scale of 1-10 with 10 being great. Pt reports she had a disagreement with her husband and is very upset. Pt reports feeling sad and is tearful. Pt states she did not sleep well due to ruminating about the disagreement.    **Pt chose to leave  group at 10 stating she cannot stay awake and needs to sleep. Pt denies SI/HI before leaving group.     Suicidal/Homicidal: Nowithout intent/plan  Plan: Pt will continue in PHP while working to decrease depression symptoms, increase daily functioning, and increase ability to manage symptoms in a healthy manner.   Diagnosis: Severe episode of recurrent major depressive disorder, without psychotic features (HCC) [F33.2]    1. Severe episode of recurrent major depressive disorder, without psychotic features (HCC)   2. GAD (generalized anxiety disorder)       Anita Guiles, LCSW 06/10/2021

## 2021-06-10 NOTE — Psych (Signed)
Virtual Visit via Video Note  I connected with Rodnesha Elie Wieland on 04/23/21 at  9:00 AM EDT by a video enabled telemedicine application and verified that I am speaking with the correct person using two identifiers.  Location: Patient: patient home Provider: clinical home office   I discussed the limitations of evaluation and management by telemedicine and the availability of in person appointments. The patient expressed understanding and agreed to proceed.   I discussed the assessment and treatment plan with the patient. The patient was provided an opportunity to ask questions and all were answered. The patient agreed with the plan and demonstrated an understanding of the instructions.   The patient was advised to call back or seek an in-person evaluation if the symptoms worsen or if the condition fails to improve as anticipated.  Pt was provided 240 minutes of non-face-to-face time during this encounter.   Donia Guiles, LCSW    [facility] BH PHP THERAPIST PROGRESS NOTE  KIRRAH MUSTIN [id number]  Session Time: 9:00 - 10:00  Participation Level: Active  Behavioral Response: CasualAlertDepressed  Type of Therapy: Group Therapy  Treatment Goals addressed: Coping  Interventions: CBT, DBT, Supportive, and Reframing  Summary: Clinician led check-in regarding current stressors and situation. Clinician utilized active listening and empathetic response and validated patient emotions. Clinician facilitated processing group on pertinent issues.   Therapist Response: AMUNIQUE NEYRA is a 45 y.o. female who presents with depression symptoms. Patient arrived within time allowed and reports that she is feeling "okay." Patient rates her mood at a 5 on a scale of 1-10 with 10 being great. Pt reports she slept "a little better" and was able to take a nap which is improving her mood. Pt reports some relief re: her mom's care which has decreased her stress. Pt reports continued struggles in the  relationship with her husband. Pt able to process. Pt engaged in discussion.         Session Time: 10:00 - 11:00   Participation Level: Active   Behavioral Response: CasualAlertDepressed   Type of Therapy: Group Therapy   Treatment Goals addressed: Coping   Interventions: CBT, DBT, Supportive and Reframing   Summary: Cln led discussion on anxious behaviors. Group members shared patterns of anxiety they experience. Cln encouraged pt's to increase insight into the roots of their anxious behaviors to be able to address triggers.      Therapist Response: Pt engaged in discussion and reports increased insight into triggers.          Session Time: 11:00- 12:00   Participation Level: Active   Behavioral Response: CasualAlertDepressed   Type of Therapy: Group Therapy, OT   Treatment Goals addressed: Coping   Interventions: Psychosocial skills training, Supportive   Summary: Occupational Therapy group   Therapist Response: Patient engaged in group. See OT note.           Session Time: 12:00 -1:00   Participation Level: Active   Behavioral Response: CasualAlertDepressed   Type of Therapy: Group therapy   Treatment Goals addressed: Coping   Interventions: CBT; Solution focused; Supportive; Reframing   Summary: 12:00 - 12:50: Cln continued topic of boundaries and introduced how to set and maintain healthy boundaries. Cln utilized handout "how to set boundaries" and group members worked through examples to practice setting appropriate boundaries.  12:50 -1:00 Clinician led check-out. Clinician assessed for immediate needs, medication compliance and efficacy, and safety concerns   Therapist Response: 12:50 - 1:00: Pt engaged in discussion and reports struggle with  maintaining boundaries.  12:50 - 1:00: At check-out, patient rates her mood at a 5 on a scale of 1-10 with 10 being great. Pt reports afternoon plans of going to the pool for exercise. Pt demonstrates some  progress as evidenced by reporting a decrease in stress. Patient denies SI/HI at the end of group.    Suicidal/Homicidal: Nowithout intent/plan  Plan: Pt will continue in PHP while working to decrease depression symptoms, increase daily functioning, and increase ability to manage symptoms in a healthy manner.   Diagnosis: MDD (major depressive disorder), recurrent episode, moderate (HCC) [F33.1]    1. MDD (major depressive disorder), recurrent episode, moderate (HCC)   2. Severe episode of recurrent major depressive disorder, without psychotic features (HCC)       Donia Guiles, LCSW 06/10/2021

## 2021-06-13 NOTE — Psych (Signed)
Virtual Visit via Video Note  I connected with [name] on 04/25/21 at  9:00 AM EDT by a video enabled telemedicine application and verified that I am speaking with the correct person using two identifiers.  Location: Patient: patient home Provider: clinical home office   I discussed the limitations of evaluation and management by telemedicine and the availability of in person appointments. The patient expressed understanding and agreed to proceed.   I discussed the assessment and treatment plan with the patient. The patient was provided an opportunity to ask questions and all were answered. The patient agreed with the plan and demonstrated an understanding of the instructions.   The patient was advised to call back or seek an in-person evaluation if the symptoms worsen or if the condition fails to improve as anticipated.  Pt was provided 240 minutes of non-face-to-face time during this encounter.   Donia Guiles, LCSW    [facility] BH PHP THERAPIST PROGRESS NOTE  [name] [id number]  Session Time: 9:00 - 10:00  Participation Level: Active  Behavioral Response: CasualAlertDepressed  Type of Therapy: Group Therapy  Treatment Goals addressed: Coping  Interventions: CBT, DBT, Supportive, and Reframing  Summary: Clinician led check-in regarding current stressors and situation. Clinician utilized active listening and empathetic response and validated patient emotions. Clinician facilitated processing group on pertinent issues.   Therapist Response: [name] is a 45 y.o. female who presents with depression symptoms. Patient arrived within time allowed and reports that she is feeling "not great." Patient rates her mood at a 4 on a scale of 1-10 with 10 being great. Pt reports she slept most of the day yesterday and feels she was shutting down. Pt reports rumination re: issues with her husband when awake. Pt struggles with energy and applying skills. Pt able to  process. Pt engaged in discussion.         Session Time: 10:00 - 11:00   Participation Level: Active   Behavioral Response: CasualAlertDepressed   Type of Therapy: Group Therapy   Treatment Goals addressed: Coping   Interventions: CBT, DBT, Supportive and Reframing   Summary: Cln led discussion on the connection between fear and anxiety. Cln contextualized anxiety and fear as both being future oriented and fed by the unknown. Group members shared ways in which fear interacts with their anxiety and the problems it creates. Cln encouraged CBT thought challenging and reframing as ways to address porblematic fears and anxieties.      Therapist Response: Pt engaged in discussion and is able to make connections between fear and anxiety.           Session Time: 11:00- 12:00   Participation Level: Active   Behavioral Response: CasualAlertDepressed   Type of Therapy: Group Therapy, OT   Treatment Goals addressed: Coping   Interventions: Psychosocial skills training, Supportive   Summary: Occupational Therapy group   Therapist Response: Patient engaged in group. See OT note.           Session Time: 12:00 -1:00   Participation Level: Active   Behavioral Response: CasualAlertDepressed   Type of Therapy: Group therapy   Treatment Goals addressed: Coping   Interventions: CBT; Solution focused; Supportive; Reframing   Summary: 12:00 - 12:50: Cln introduced CBT and the way in which it can provide context for addressing stumbling blocks. Group discussed "the problem is not the problem, the problem is how we're thinking about the problem" and tried to change perspective on current struggles.  12:50 -1:00 Clinician led  check-out. Clinician assessed for immediate needs, medication compliance and efficacy, and safety concerns   Therapist Response: 12:50 - 1:00: Pt engaged in discussion and is able to attempt reframing using CBT.  12:50 - 1:00: At check-out, patient rates her  mood at a 4 on a scale of 1-10 with 10 being great. Pt reports afternoon plans of attempting chores. Pt demonstrates some progress as evidenced by increased sleep. Patient denies SI/HI at the end of group.    Suicidal/Homicidal: Nowithout intent/plan  Plan: Pt will continue in PHP while working to decrease depression symptoms, increase daily functioning, and increase ability to manage symptoms in a healthy manner.   Diagnosis: Severe episode of recurrent major depressive disorder, without psychotic features (HCC) [F33.2]    1. Severe episode of recurrent major depressive disorder, without psychotic features (HCC)   2. GAD (generalized anxiety disorder)       Donia Guiles, LCSW 06/13/2021

## 2021-06-20 ENCOUNTER — Ambulatory Visit (HOSPITAL_COMMUNITY)
Admission: RE | Admit: 2021-06-20 | Discharge: 2021-06-20 | Disposition: A | Discharge status: Home / Self Care | Payer: BC Managed Care – PPO | Source: Ambulatory Visit | Attending: Adult Health | Admitting: Adult Health

## 2021-06-20 ENCOUNTER — Other Ambulatory Visit: Payer: Self-pay

## 2021-06-20 DIAGNOSIS — Z1231 Encounter for screening mammogram for malignant neoplasm of breast: Secondary | ICD-10-CM

## 2021-06-20 NOTE — Psych (Signed)
Virtual Visit via Video Note  I connected with [name] on 04/26/21 at  9:00 AM EDT by a video enabled telemedicine application and verified that I am speaking with the correct person using two identifiers.  Location: Patient: patient home Provider: clinical home office   I discussed the limitations of evaluation and management by telemedicine and the availability of in person appointments. The patient expressed understanding and agreed to proceed.   I discussed the assessment and treatment plan with the patient. The patient was provided an opportunity to ask questions and all were answered. The patient agreed with the plan and demonstrated an understanding of the instructions.   The patient was advised to call back or seek an in-person evaluation if the symptoms worsen or if the condition fails to improve as anticipated.  Pt was provided 240 minutes of non-face-to-face time during this encounter.   Donia Guiles, LCSW    [facility] BH PHP THERAPIST PROGRESS NOTE  LARITA DEREMER [id number]  Session Time: 9:00 - 10:00  Participation Level: Active  Behavioral Response: CasualAlertDepressed  Type of Therapy: Group Therapy  Treatment Goals addressed: Coping  Interventions: CBT, DBT, Supportive, and Reframing  Summary: Clinician led check-in regarding current stressors and situation. Clinician utilized active listening and empathetic response and validated patient emotions. Clinician facilitated processing group on pertinent issues.   Therapist Response: KEELEY SUSSMAN is a 45 y.o. female who presents with depression symptoms. Patient arrived within time allowed and reports that she is feeling "down." Patient rates her mood at a 3.5 on a scale of 1-10 with 10 being great. Pt reports continued stressors of the relationship with her husband. Pt reports rumination and future thinking, struggling with staying present. Pt states difficulty sleeping and having crying spells  throughout the evening. Pt able to process. Pt engaged in discussion.         Session Time: 10:00 - 11:00   Participation Level: Active   Behavioral Response: CasualAlertDepressed   Type of Therapy: Group Therapy   Treatment Goals addressed: Coping   Interventions: CBT, DBT, Supportive and Reframing   Summary: Cln led discussion on how to handle big feelings. Cln encouraged pt's to use the intensity of the feeling to guide how to proceed: if it is a lower intensity problem solving and thought challenging can be useful, for a larger intensity distress tolerance skills are the most effective strategy.      Therapist Response: Pt engaged in discussion and is able to share ways in which re-thinking action when they are emotional may benefit.            Session Time: 11:00- 12:00   Participation Level: Active   Behavioral Response: CasualAlertDepressed   Type of Therapy: Group Therapy, OT   Treatment Goals addressed: Coping   Interventions: Psychosocial skills training, Supportive   Summary: Occupational Therapy group   Therapist Response: Patient engaged in group. See OT note.           Session Time: 12:00 -1:00   Participation Level: Active   Behavioral Response: CasualAlertDepressed   Type of Therapy: Group therapy   Treatment Goals addressed: Coping   Interventions: CBT; Solution focused; Supportive; Reframing   Summary: 12:00 - 12:50: Cln led activity on finding what makes you happy. Cln tasked group members to consider times in which they have been happy in the past, moments in which they have felt less distressed recently, and things that give them any rumblings of joy. Cln worked with pt's  to process barriers and to consider one thing for now, not the bigger picture.  12:50 -1:00 Clinician led check-out. Clinician assessed for immediate needs, medication compliance and efficacy, and safety concerns   Therapist Response: 12:50 - 1:00: Pt engaged in  discussion. Pt able to process and come up with one thing thing to do to make herself happy. 12:50 - 1:00: At check-out, patient rates her mood at a 5 on a scale of 1-10 with 10 being great. Pt reports afternoon plans of napping. Pt demonstrates some progress as evidenced by improved mood throughout group. Patient denies SI/HI at the end of group.    Suicidal/Homicidal: Nowithout intent/plan  Plan: Pt will continue in PHP while working to decrease depression symptoms, increase daily functioning, and increase ability to manage symptoms in a healthy manner.   Diagnosis: Severe episode of recurrent major depressive disorder, without psychotic features (HCC) [F33.2]    1. Severe episode of recurrent major depressive disorder, without psychotic features (HCC)   2. GAD (generalized anxiety disorder)       Donia Guiles, LCSW 06/20/2021

## 2021-07-04 ENCOUNTER — Other Ambulatory Visit: Payer: Self-pay

## 2021-07-04 ENCOUNTER — Telehealth (INDEPENDENT_AMBULATORY_CARE_PROVIDER_SITE_OTHER): Payer: BC Managed Care – PPO | Admitting: Psychiatry

## 2021-07-04 DIAGNOSIS — F332 Major depressive disorder, recurrent severe without psychotic features: Secondary | ICD-10-CM | POA: Diagnosis not present

## 2021-07-04 DIAGNOSIS — F411 Generalized anxiety disorder: Secondary | ICD-10-CM | POA: Diagnosis not present

## 2021-07-04 MED ORDER — ALPRAZOLAM 0.5 MG PO TABS: 0.5000 mg | ORAL_TABLET | Freq: Every evening | ORAL | 1 refills | Status: DC | PRN

## 2021-07-04 MED ORDER — PROPRANOLOL HCL 20 MG PO TABS: 20.0000 mg | ORAL_TABLET | Freq: Every day | ORAL | 1 refills | Status: DC | PRN

## 2021-07-04 NOTE — Progress Notes (Signed)
Virtual Visit via Video Note  I connected with [name] on 07/04/21 at  8:00 AM EDT by a video enabled telemedicine application and verified that I am speaking with the correct person using two identifiers.  Location: Patient: work Provider: office   I discussed the limitations of evaluation and management by telemedicine and the availability of in person appointments. The patient expressed understanding and agreed to proceed.  History of Present Illness: "Not great". She has a lot going on and she is not surprised she is not doing well. Her depression is ongoing. She can cry at anytime. Her fatigue is ongoing. She can sleep all day and all night. [name] sleeps any chance she can. Most weekends she sleeps and during the week she is working. [name] only takes the Xanax at night because it helps her sleep. Her anxiety is high and she is feeling very overwhelmed. [name] can't focus at all and it is frustrating. [name] is hard on herself. [name] has not been engage in therapy due to her erratic work schedule. She tried the Propranolol but it has no made much of difference. She tries to deal with stress at work but right now is having to deal with a problem child. [name] does not feel safe and does not feel it is safe for the other children due to the problem child. She often has to call the parents and have him picked up. Her mother has moved in with [name] and [name] has to care for her in the evenings and weekends. [name] denies SI/HI but is endorsing passive thoughts of death. She continues to share that her stressors are the biggest cause of her mental health suffering and she does not want her meds changed.    Observations/Objective: Psychiatric Specialty Exam: ROS  There were no vitals taken for this visit.There is no height or weight on file to calculate BMI.  General Appearance: Neat and Well Groomed  Eye Contact:  Good  Speech:  Clear and Coherent and Normal Rate  Volume:  Normal   Mood:  Anxious and Depressed  Affect:  Congruent, Depressed, and Tearful  Thought Process:  Goal Directed, Linear, and Descriptions of Associations: Intact  Orientation:  Full (Time, Place, and Person)  Thought Content:  Logical  Suicidal Thoughts:  No  Homicidal Thoughts:  No  Memory:  Immediate;   Good  Judgement:  Good  Insight:  Good  Psychomotor Activity:  Normal  Concentration:  Concentration: Good  Recall:  Good  Fund of Knowledge:  Good  Language:  Good  Akathisia:  No  Handed:  Right  AIMS (if indicated):     Assets:  Communication Skills Desire for Improvement Financial Resources/Insurance Housing Intimacy Resilience Talents/Skills Transportation  ADL's:  Intact  Cognition:  WNL  Sleep:        Assessment and Plan: Depression screen [facility] 2/9 07/04/2021 05/30/2021 05/01/2021 04/18/2021 04/17/2021  Decreased Interest 3 3 2 2 3   Down, Depressed, Hopeless 3 3 3 3 3   PHQ - 2 Score 6 6 5 5 6   Altered sleeping 3 2 0 0 3  Tired, decreased energy 3 3 3 3 3   Change in appetite 3 3 1 1 3   Feeling bad or failure about yourself  3 3 3 3 3   Trouble concentrating 3 3 1 1 1   Moving slowly or fidgety/restless 2 2 3 3 1   Suicidal thoughts 2 1 2 2 2   PHQ-9 Score 25 23 18 18 22   Difficult doing  work/chores Extremely dIfficult Extremely dIfficult Extremely dIfficult Extremely dIfficult Extremely dIfficult  Some recent data might be hidden    Flowsheet Row Video Visit [facility] 07/04/2021 in [facility] Video Visit from 05/30/2021 in [facility] Counselor from 05/01/2021 in [facility] RISK CATEGORY Error: Q3, 4, or 5 should not be populated when Q2 is No Error: Q3, 4, or 5 should not be populated when Q2 is No Error: Q3, 4, or 5 should not be populated when Q2 is No        1. GAD (generalized anxiety disorder) - ALPRAZolam (XANAX) 0.5 MG tablet; Take 1 tablet (0.5 mg total) by  mouth at bedtime as needed for anxiety or sleep.  Dispense: 30 tablet; Refill: 1 - propranolol (INDERAL) 20 MG tablet; Take 1 tablet (20 mg total) by mouth daily as needed (anxiety).  Dispense: 30 tablet; Refill: 1  2. Severe episode of recurrent major depressive disorder, without psychotic features (HCC)  - increase Propranolol 20mg  po qD prn anxiety. All other anxiolytics cause significant fatigue and sleepiness  - continue all other meds at same doses Effexor XR Geodon  Buspar Xanax  - her erratic work scheduled and obligations at home prevent her from engaging in therapy  Follow Up Instructions: In 1-2 months or sooner if needed   I discussed the assessment and treatment plan with the patient. The patient was provided an opportunity to ask questions and all were answered. The patient agreed with the plan and demonstrated an understanding of the instructions.   The patient was advised to call back or seek an in-person evaluation if the symptoms worsen or if the condition fails to improve as anticipated.  I provided 21 minutes of non-face-to-face time during this encounter.   , MD

## 2021-07-18 ENCOUNTER — Encounter: Payer: Self-pay | Admitting: Neurology

## 2021-07-18 ENCOUNTER — Ambulatory Visit: Payer: BC Managed Care – PPO | Admitting: Neurology

## 2021-07-18 ENCOUNTER — Other Ambulatory Visit: Payer: Self-pay

## 2021-07-18 VITALS — BP 120/75 | HR 84 | Ht 65.0 in | Wt >= 6400 oz

## 2021-07-18 DIAGNOSIS — R400 Somnolence: Secondary | ICD-10-CM

## 2021-07-18 DIAGNOSIS — G4733 Obstructive sleep apnea (adult) (pediatric): Secondary | ICD-10-CM

## 2021-07-18 DIAGNOSIS — R351 Nocturia: Secondary | ICD-10-CM

## 2021-07-18 DIAGNOSIS — Z6841 Body Mass Index (BMI) 40.0 and over, adult: Secondary | ICD-10-CM

## 2021-07-18 DIAGNOSIS — Z820 Family history of epilepsy and other diseases of the nervous system: Secondary | ICD-10-CM

## 2021-07-18 DIAGNOSIS — R519 Headache, unspecified: Secondary | ICD-10-CM

## 2021-07-18 NOTE — Progress Notes (Signed)
Subjective:    Patient ID: [name] is a 45 y.o. female.  HPI    [name], MD, [name], [name], [name],   [name], [name], upon your kind request, in my sleep clinic today for initial consultation of her sleep disorder, in particular, [name]  As you know, [name] is a 45 year old right-handed woman with an underlying medical history of allergies, arthritis, anxiety, depression, reflux disease, thyroid disease, vitamin D deficiency, asthma, and morbid obesity with a BMI of over 80, who reports snoring and excessive daytime somnolence as well as witnessed breathing pauses while asleep per husband's feedback.  She does not wake up rested.  I reviewed your office note from 02/18/2021.  Her Epworth sleepiness score is 20/24, fatigue severity score 62 out of 63.  She has been sleepy at the wheel and has dozed off while driving, never had a car accident but strongly advised not to drive when feeling sleepy.  She has an approximately 10-minute commute to her work.  She had a sleep study several years ago and I was able to review the results: Sleep study was performed at [facility] on 01/25/2016, study was interpreted by Dr. Beryle Beams.  Sleep efficiency was 76.6%, REM latency was markedly delayed at 273.5 minutes.  Wake after sleep onset was 32.5 minutes, sleep latency 56.6 minutes.  Total AHI was 9.2/h, O2 nadir 82%.  Her REM AHI was 21.6/h. She has not been on CPAP therapy. She reports that she was told she did not have sleep apnea.  Weight documented at the time of her sleep study in April 2017 was 377 pounds, she has had an over 100 pound weight gain.  Of note, she is on potentially sedating medications but reports that her medications have not affected her in the past.  She is on high-dose  Effexor, 225 mg daily, she is also on Geodon 40 mg twice daily, Xanax at bedtime, and Inderal as needed.  She lives with her husband, she has 2 children from her first husband.  She has an older daughter who is on her own, her son is 51 and is currently staying more with his dad.  Patient lives with her current husband and her mom and is her caretaker as mom has advanced lung cancer.  Patient reports that mom also has sleep apnea and has a CPAP machine.  Patient had a tonsillectomy and adenoidectomy as a child.  Bedtime is generally between 9 and 10 PM and rise time between 7 and 8 AM.  She works as a Manufacturing systems engineer.  She is a non-smoker and drinks a lot of caffeine in the form of sweet tea, coffee and diet soda.  She is unsure how many servings.  Bariatric surgery was discussed with her in the past as I understand.  She reports that she currently is not able to pursue any elective surgery because she is her mom's caretaker and she is not able to make time for surgery for herself.  She has nocturia about 2-3 times per average night and has woken up occasionally with a headache.  In the past few months her sleepiness has increased quite significantly.  She is followed by psychiatry but has not had any recent medication changes.  Her Past Medical History Is Significant For: Past Medical History:  Diagnosis  Date   Allergy    Anxiety    Arthritis    Asthma    Breast nodule 01/02/2015   Depression    Fatigue    GERD (gastroesophageal reflux disease)    Herpes    HPV in female    Obesity    PMDD (premenstrual dysphoric disorder)    Thyroid disease    hypothryoidism   Trauma    Vaginal Pap smear, abnormal    Vitamin D deficiency     Her Past Surgical History Is Significant For: Past Surgical History:  Procedure Laterality Date   CESAREAN SECTION     CHOLECYSTECTOMY     TONSILECTOMY/ADENOIDECTOMY WITH MYRINGOTOMY     WISDOM TOOTH EXTRACTION      Her Family History Is Significant  For: Family History  Problem Relation Age of Onset   Hypertension Mother    Arthritis Mother    Anxiety disorder Mother    Lung cancer Mother    Sleep apnea Mother    Asthma Father    COPD Father    Arthritis Father    Chronic bronchitis Father    Hypertension Father    Hyperlipidemia Father    Anxiety disorder Father    Depression Father    Alcohol abuse Father    Drug abuse Brother    Alcohol abuse Brother    Hypertension Brother    Mental illness Brother    Depression Brother    Hyperlipidemia Brother    Hearing loss Brother    Asthma Brother    Learning disabilities Brother    Asthma Maternal Grandmother    Arthritis Maternal Grandmother    Hypertension Maternal Grandmother    Congestive Heart Failure Maternal Grandmother    Diabetes Maternal Grandfather    Stroke Maternal Grandfather    ADD / ADHD Son    Cancer Maternal Aunt    Cancer Maternal Uncle    Sleep apnea Maternal Uncle    Cancer Paternal Aunt    Cancer Paternal Uncle     Her Social History Is Significant For: Social History   Socioeconomic History   Marital status: Married    Spouse name: Not on file   Number of children: 2   Years of education: Not on file   Highest education level: Associate degree: occupational, Scientist, product/process development, or vocational program  Occupational History   Not on file  Tobacco Use   Smoking status: Former   Smokeless tobacco: Never   Tobacco comments:    Smoked "on occasion around friends" in high school "because it looked cool"  Vaping Use   Vaping Use: Never used  Substance and Sexual Activity   Alcohol use: Yes    Comment: occ   Drug use: No   Sexual activity: Yes    Birth control/protection: Pill    Comment: takes norethindrone acetate for heavy menses  Other Topics Concern   Not on file  Social History Narrative   Social Hx:   Current living situation- living in [place] with husband, son is 50/50 custody with biological father, daughter lives alone. Patient's  mother lives with her.    Raised in [place] by mom and dad   Siblings- 2 brothers and pt is the youngest. She is 56 yrs younger than her youngest brother   Schooling- associates degree in early childhood   Married- yes - currently in 3rd marriage   Kids- 2 from 1st marriage      Legal issues- denies      Caffeine: all  day long everyday, unable to quantify    Social Determinants of Health   Financial Resource Strain: Not on file  Food Insecurity: Not on file  Transportation Needs: Not on file  Physical Activity: Not on file  Stress: Not on file  Social Connections: Not on file    Her Allergies Are:  Allergies  Allergen Reactions   Latex    Naproxen    Shrimp [Shellfish Allergy] Other (See Comments)    Intolerance   :   Her Current Medications Are:  Outpatient Encounter Medications as of 07/18/2021  Medication Sig   ALPRAZolam (XANAX) 0.5 MG tablet Take 1 tablet (0.5 mg total) by mouth at bedtime as needed for anxiety or sleep.   augmented betamethasone dipropionate (DIPROLENE-AF) 0.05 % cream    busPIRone (BUSPAR) 10 MG tablet Take 2 tablets (20 mg total) by mouth 2 (two) times daily.   cetirizine (ZYRTEC) 10 MG tablet Take 10 mg by mouth daily.   Cholecalciferol (VITAMIN D3 PO) Take 1 tablet by mouth daily in the afternoon.   ibuprofen (ADVIL) 600 MG tablet Take 600 mg by mouth 3 (three) times daily. As needed   levalbuterol (XOPENEX HFA) 45 MCG/ACT inhaler Inhale into the lungs every 4 (four) hours as needed for wheezing.   levothyroxine (SYNTHROID) 100 MCG tablet Take 1 tablet (100 mcg total) by mouth daily before breakfast.   levothyroxine (SYNTHROID) 200 MCG tablet TAKE 1 TABLET(200 MCG) BY MOUTH DAILY BEFORE AND BREAKFAST   MELATONIN PO Take by mouth at bedtime.   Misc Natural Products (CRANBERRY/PROBIOTIC PO) Take by mouth daily. Cranberry/Probiotic/Prebiotic Blend-2 daily   montelukast (SINGULAIR) 10 MG tablet Take 10 mg by mouth daily.    Multiple  Vitamins-Minerals (WOMENS MULTI VITAMIN & MINERAL PO) Take by mouth daily.   norethindrone (AYGESTIN) 5 MG tablet TAKE 2 TABLETS BY MOUTH DAILY   propranolol (INDERAL) 20 MG tablet Take 1 tablet (20 mg total) by mouth daily as needed (anxiety).   venlafaxine XR (EFFEXOR-XR) 75 MG 24 hr capsule Take 3 capsules (225 mg total) by mouth daily with breakfast.   ziprasidone (GEODON) 40 MG capsule Take 1 capsule (40 mg total) by mouth 2 (two) times daily with a meal.   No facility-administered encounter medications on file as of 07/18/2021.  :   Review of Systems:  Out of a complete 14 point review of systems, all are reviewed and negative with the exception of these symptoms as listed below:  Review of Systems  Neurological:        Patient is here for a sleep consult. She suffers from daytime sleepiness and nocturia, ongoing for several months. She reports she has been tested for sleep apnea twice and both tests were negative. However she states the tests were several years ago.  Epworth Sleepiness Scale 0= would never doze 1= slight chance of dozing 2= moderate chance of dozing 3= high chance of dozing  Sitting and reading: 3 Watching TV: 3 Sitting inactive in a public place (ex. Theater or meeting): 2 As a passenger in a car for an hour without a break: 3 Lying down to rest in the afternoon: 3 Sitting and talking to someone: 1 Sitting quietly after lunch (no alcohol): 3 In a car, while stopped in traffic: 2 Total: 20   FSS 62   Objective:  Neurological Exam  Physical Exam Physical Examination:   Vitals:   07/18/21 1506  BP: 120/75  Pulse: 84    General Examination: The patient is a  very pleasant 45 y.o. female in no acute distress. She appears well-developed and well-nourished and well groomed.   HEENT: Normocephalic, atraumatic, pupils are equal, round and reactive to light, extraocular tracking is good without limitation to gaze excursion or nystagmus noted. Hearing is  grossly intact. Face is symmetric with normal facial animation. Speech is clear with no dysarthria noted. There is no hypophonia. There is no lip, neck/head, jaw or voice tremor. Neck is supple with full range of passive and active motion. There are no carotid bruits on auscultation. Oropharynx exam reveals: mild mouth dryness, good dental hygiene and moderate airway crowding, due to small airway and Mallampati class IV, uvula not fully visualized, difficult to see airway.  Tongue protrudes centrally, neck circumference of 16 1 8  inches.  She has a minimal overbite.  Chest: Clear to auscultation without wheezing, rhonchi or crackles noted.  Heart: S1+S2+0, regular and normal without murmurs, rubs or gallops noted.   Abdomen: Soft, non-tender and non-distended with normal bowel sounds appreciated on auscultation.  Extremities: There is trace pitting edema and mild erythema in the distal lower extremities bilaterally.   Skin: Warm and dry with mild erythema noted in both feet.   Musculoskeletal: exam reveals no obvious joint deformities.   Neurologically:  Mental status: The patient is awake, alert and oriented in all 4 spheres. Her immediate and remote memory, attention, language skills and fund of knowledge are appropriate. There is no evidence of aphasia, agnosia, apraxia or anomia. Speech is clear with normal prosody and enunciation. Thought process is linear. Mood is normal and affect is normal.  Cranial nerves II - XII are as described above under HEENT exam.  Motor exam: Normal bulk, strength and tone is noted. There is no tremor, Romberg is not tested due to safety concerns, fine motor skills grossly intact.  She walks with a four-point cane.  Assessment and Plan:  In summary, [name] is a very pleasant 45 y.o.-year old female with an underlying medical history of allergies, arthritis, anxiety, depression, reflux disease, thyroid disease, vitamin D deficiency, asthma, and morbid  obesity with a BMI of over 80, whose history and physical exam are concerning for obstructive sleep apnea (OSA). I had a long chat with the patient about my findings and the diagnosis of OSA, its prognosis and treatment options. We talked about medical treatments, surgical interventions and non-pharmacological approaches. I explained in particular the risks and ramifications of untreated moderate to severe OSA, especially with respect to developing cardiovascular disease down the Road, including congestive heart failure, difficult to treat hypertension, cardiac arrhythmias, or stroke. Even type 2 diabetes has, in part, been linked to untreated OSA. Symptoms of untreated OSA include daytime sleepiness, memory problems, mood irritability and mood disorder such as depression and anxiety, lack of energy, as well as recurrent headaches, especially morning headaches. We talked about trying to maintain a healthy lifestyle in general, as well as the importance of weight control. We also talked about the importance of good sleep hygiene. I recommended the following at this time: sleep study.  I outlined the difference between a laboratory attended sleep study versus home sleep test.  I explained the sleep test procedure to the patient and also outlined possible surgical and non-surgical treatment options of OSA, including the use of a custom-made dental device (which would require a referral to a specialist dentist or oral surgeon), upper airway surgical options, such as traditional UPPP or a novel less invasive surgical option in the form of  Inspire hypoglossal nerve stimulation (which would involve a referral to an ENT surgeon). I also explained the CPAP treatment option to the patient, who indicated that she would be willing to try CPAP if the need arises. I explained the importance of being compliant with PAP treatment, not only for insurance purposes but primarily to improve Her symptoms, and for the patient's long  term health benefit, including to reduce Her cardiovascular risks. I answered all her questions today and the patient was in agreement. I plan to see her back after the sleep study is completed and encouraged her to call with any interim questions, concerns, problems or updates.   Thank you very much for allowing me to participate in the care of this nice patient. If I can be of any further assistance to you please do not hesitate to call me at [phone].  Sincerely,   [name], MD, PhD

## 2021-07-18 NOTE — Patient Instructions (Signed)

## 2021-07-29 DIAGNOSIS — F330 Major depressive disorder, recurrent, mild: Secondary | ICD-10-CM | POA: Diagnosis not present

## 2021-08-01 ENCOUNTER — Telehealth: Payer: Self-pay

## 2021-08-01 NOTE — Telephone Encounter (Signed)
Called patient to schedule sleep study, patient asked to call us back at a later date as she is currently dealing with a death in the family. Will wait for patient to call to schedule sleep study.

## 2021-08-14 DIAGNOSIS — R7303 Prediabetes: Secondary | ICD-10-CM | POA: Insufficient documentation

## 2021-08-20 DIAGNOSIS — Z1159 Encounter for screening for other viral diseases: Secondary | ICD-10-CM | POA: Diagnosis not present

## 2021-08-20 DIAGNOSIS — Z1152 Encounter for screening for COVID-19: Secondary | ICD-10-CM | POA: Diagnosis not present

## 2021-08-26 ENCOUNTER — Ambulatory Visit (INDEPENDENT_AMBULATORY_CARE_PROVIDER_SITE_OTHER): Payer: BC Managed Care – PPO | Admitting: Neurology

## 2021-08-26 DIAGNOSIS — G4734 Idiopathic sleep related nonobstructive alveolar hypoventilation: Secondary | ICD-10-CM

## 2021-08-26 DIAGNOSIS — G4733 Obstructive sleep apnea (adult) (pediatric): Secondary | ICD-10-CM

## 2021-08-26 DIAGNOSIS — R519 Headache, unspecified: Secondary | ICD-10-CM

## 2021-08-26 DIAGNOSIS — R7303 Prediabetes: Secondary | ICD-10-CM | POA: Diagnosis not present

## 2021-08-26 DIAGNOSIS — Z820 Family history of epilepsy and other diseases of the nervous system: Secondary | ICD-10-CM

## 2021-08-26 DIAGNOSIS — R400 Somnolence: Secondary | ICD-10-CM

## 2021-08-26 DIAGNOSIS — R351 Nocturia: Secondary | ICD-10-CM

## 2021-08-29 ENCOUNTER — Other Ambulatory Visit: Payer: Self-pay

## 2021-08-29 ENCOUNTER — Telehealth (HOSPITAL_BASED_OUTPATIENT_CLINIC_OR_DEPARTMENT_OTHER): Payer: BC Managed Care – PPO | Admitting: Psychiatry

## 2021-08-29 DIAGNOSIS — F331 Major depressive disorder, recurrent, moderate: Secondary | ICD-10-CM

## 2021-08-29 DIAGNOSIS — R454 Irritability and anger: Secondary | ICD-10-CM

## 2021-08-29 DIAGNOSIS — F411 Generalized anxiety disorder: Secondary | ICD-10-CM

## 2021-08-29 MED ORDER — ZIPRASIDONE HCL 60 MG PO CAPS: 60.0000 mg | ORAL_CAPSULE | Freq: Two times a day (BID) | ORAL | 0 refills | Status: DC

## 2021-08-29 MED ORDER — VENLAFAXINE HCL ER 75 MG PO CP24: 225.0000 mg | ORAL_CAPSULE | Freq: Every day | ORAL | 0 refills | Status: DC

## 2021-08-29 MED ORDER — ALPRAZOLAM 0.5 MG PO TABS: 0.5000 mg | ORAL_TABLET | Freq: Every evening | ORAL | 1 refills | Status: DC | PRN

## 2021-08-29 MED ORDER — PROPRANOLOL HCL 20 MG PO TABS: 20.0000 mg | ORAL_TABLET | Freq: Every day | ORAL | 1 refills | Status: DC | PRN

## 2021-08-29 MED ORDER — BUSPIRONE HCL 10 MG PO TABS: 20.0000 mg | ORAL_TABLET | Freq: Two times a day (BID) | ORAL | 0 refills | Status: DC

## 2021-08-29 NOTE — Progress Notes (Signed)
See procedure note.

## 2021-08-29 NOTE — Progress Notes (Signed)
Virtual Visit via Telephone Note  I connected with [name] on 08/29/21 at  8:30 AM EST by telephone and verified that I am speaking with the correct person using two identifiers. We attempted to connect by video many times but were unsuccessful.   Location: Patient: home Provider: office   I discussed the limitations, risks, security and privacy concerns of performing an evaluation and management service by telephone and the availability of in person appointments. I also discussed with the patient that there may be a patient responsible charge related to this service. The patient expressed understanding and agreed to proceed.   History of Present Illness: "Not great actually".  Her mom passed away and [name] had to care for her while she deteriorated. [name] stayed out of work for 1 week. She is back to work now but doesn't think she should be. [name]'s husband moved out a few weeks ago. She is grieving the loss of her mom and the ending of her marriage. [name] had COVID last week and has a lot going on. Her work schedule is chaotic and things are not any better at work.  [name] really wants to meet with her therapist but hasn't been able to schedule an appointment due to her work schedule. She feels like she is not doing well but is trying her best. She feels she is not mentally present at work. After work she eats and then lies down. She does call her friend every night. [name] feels lonely at home. Her concentration is poor and she is having trouble trying to get her bills paid and other necessary things. [name] had a sleep study on Monday night. She is waiting for the results. She hopes it will help her figure out why she is so fatigued and sleeping too much.  [name] admits to having passive thoughts of death about 1-2x/week. She feels useless at times and that triggers to feel that she would be better off dead. [name] feels overwhelmed by everything lately. She denies SI/HI.     Observations/Objective:  General Appearance: unable to assess  Eye Contact:  unable to assess  Speech:  Clear and Coherent and Normal Rate  Volume:  Normal  Mood:  Anxious and Depressed  Affect:  Congruent  Thought Process:  Goal Directed, Linear, and Descriptions of Associations: Intact  Orientation:  Full (Time, Place, and Person)  Thought Content:  Logical  Suicidal Thoughts:  No  Homicidal Thoughts:  No  Memory:  Immediate;   Good  Judgement:  Fair  Insight:  Good  Psychomotor Activity: unable to assess  Concentration:  Concentration: Good  Recall:  Good  Fund of Knowledge:  Good  Language:  Good  Akathisia:  unable to assess  Handed:  unable to assess  AIMS (if indicated):     Assets:  Communication Skills Desire for Improvement Financial Resources/Insurance Housing Talents/Skills Transportation Vocational/Educational  ADL's:  unable to assess  Cognition:  WNL  Sleep:        Assessment and Plan: Depression screen [facility] 2/9 08/29/2021 07/04/2021 05/30/2021 05/01/2021 04/18/2021  Decreased Interest 3 3 3 2 2   Down, Depressed, Hopeless 3 3 3 3 3   PHQ - 2 Score 6 6 6 5 5   Altered sleeping 3 3 2  0 0  Tired, decreased energy 3 3 3 3 3   Change in appetite 3 3 3 1 1   Feeling bad or failure about yourself  3 3 3 3 3   Trouble concentrating 3 3 3 1  1  Moving slowly or fidgety/restless 2 2 2 3 3   Suicidal thoughts 1 2 1 2 2   PHQ-9 Score 24 25 23 18 18   Difficult doing work/chores Extremely dIfficult Extremely dIfficult Extremely dIfficult Extremely dIfficult Extremely dIfficult  Some recent data might be hidden    Flowsheet Row [facility]/29/[facility]21 [facility] Video Visit from 07[facility]04/2021 in [facility] Video Visit from 05/30/2021 in [facility]  C-SSRS RISK CATEGORY Error: Q3, 4, or 5 should not be populated when Q2 is No Error: Q3, 4, or 5 should  not be populated when Q2 is No Error: Q3, 4, or 5 should not be populated when Q2 is No      - encouraged to set up appointment with her therapist and engage in self care  - encouraged to engage in hospice grief therapy  -increase Geodon to target MDD   1. GAD (generalized anxiety disorder) - ALPRAZolam (XANAX) 0.5 MG tablet; Take 1 tablet (0.5 mg total) by mouth at bedtime as needed for anxiety or sleep.  Dispense: 30 tablet; Refill: 1 - busPIRone (BUSPAR) 10 MG tablet; Take 2 tablets (20 mg total) by mouth 2 (two) times daily.  Dispense: 360 tablet; Refill: 0 - propranolol (INDERAL) 20 MG tablet; Take 1 tablet (20 mg total) by mouth daily as needed (anxiety).  Dispense: 30 tablet; Refill: 1 - venlafaxine XR (EFFEXOR-XR) 75 MG 24 hr capsule; Take 3 capsules (225 mg total) by mouth daily with breakfast.  Dispense: 270 capsule; Refill: 0 - ziprasidone (GEODON) 60 MG capsule; Take 1 capsule (60 mg total) by mouth 2 (two) times daily with a meal.  Dispense: 60 capsule; Refill: 0  2. MDD (major depressive disorder), recurrent episode, moderate (HCC) - busPIRone (BUSPAR) 10 MG tablet; Take 2 tablets (20 mg total) by mouth 2 (two) times daily.  Dispense: 360 tablet; Refill: 0 - venlafaxine XR (EFFEXOR-XR) 75 MG 24 hr capsule; Take 3 capsules (225 mg total) by mouth daily with breakfast.  Dispense: 270 capsule; Refill: 0 - ziprasidone (GEODON) 60 MG capsule; Take 1 capsule (60 mg total) by mouth 2 (two) times daily with a meal.  Dispense: 60 capsule; Refill: 0  3. Irritability and anger - ziprasidone (GEODON) 60 MG capsule; Take 1 capsule (60 mg total) by mouth 2 (two) times daily with a meal.  Dispense: 60 capsule; Refill: 0   Follow Up Instructions: In 2 weeks or sooner if needed   I discussed the assessment and treatment plan with the patient. The patient was provided an opportunity to ask questions and all were answered. The patient agreed with the plan and demonstrated an understanding of  the instructions.   The patient was advised to call back or seek an in-person evaluation if the symptoms worsen or if the condition fails to improve as anticipated.  I provided 18 minutes of non-face-to-face time during this encounter.   08/31/2021, MD

## 2021-09-02 ENCOUNTER — Ambulatory Visit (INDEPENDENT_AMBULATORY_CARE_PROVIDER_SITE_OTHER): Payer: BC Managed Care – PPO | Admitting: Psychiatry

## 2021-09-02 ENCOUNTER — Other Ambulatory Visit: Payer: Self-pay

## 2021-09-02 DIAGNOSIS — F411 Generalized anxiety disorder: Secondary | ICD-10-CM
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
## 2021-09-03 NOTE — Addendum Note (Signed)
Addended by: [name] on: 09/03/2021 02:06 PM   Modules accepted: Orders

## 2021-09-03 NOTE — Procedures (Signed)
[facility]  HOME SLEEP TEST (Watch PAT) REPORT  STUDY DATE: 08/26/2021  DOB: 1976-03-23  MRN: [id number]  ORDERING CLINICIAN: [name], MD, PhD   REFERRING CLINICIAN: Newt Minion, NP  CLINICAL INFORMATION/HISTORY: 45 year old right-handed woman with an underlying medical history of allergies, arthritis, anxiety, depression, reflux disease, thyroid disease, vitamin D deficiency, asthma, and morbid obesity with a BMI of over 80, who reports snoring and excessive daytime somnolence as well as witnessed breathing pauses while asleep per husband's feedback.  She does not wake up rested.  Epworth sleepiness score: 20/24.  BMI: 87.4 kg/m  FINDINGS:   Sleep Summary:   Total Recording Time (hours, min): 11 hours, 48 minutes  Total Sleep Time (hours, min):  11 hours, 16 minutes   Percent REM (%):    19%   Respiratory Indices:   Calculated pAHI (per hour):  87.3/hour         REM pAHI:    72/hour       NREM pAHI: 90.4/hour  Oxygen Saturation Statistics:    Oxygen Saturation (%) Mean: 89%   Minimum oxygen saturation (%):                 65%   O2 Saturation Range (%): 65-98%    O2 Saturation (minutes) <=88%: 263.7 min  Pulse Rate Statistics:   Pulse Mean (bpm):    92/min    Pulse Range (57-127/min)   IMPRESSION: 1. OSA (obstructive sleep apnea) severe 2. Nocturnal hypoxemia  RECOMMENDATION:  This home sleep test demonstrates severe obstructive sleep apnea with a total AHI of 87.3/hour and O2 nadir of 65% with significant time below or at 88% saturation of 263.7 minutes for the night, indicating significant nocturnal hypoxemia.  Snoring was detected, ranging from mild to loud.  Treatment with positive airway pressure is highly recommended. This will require - ideally - a full night CPAP titration study for proper treatment settings, O2 monitoring and mask fitting. For now, the patient will be advised to proceed with an autoPAP  titration/trial at home.  A laboratory attended titration study can be considered in the future for optimization of his treatment and better tolerance of therapy.  Alternative treatment options are limited secondary to the severity of the patient's sleep disordered breathing.  Concomitant weight loss is highly recommended.  Please note, that untreated obstructive sleep apnea may carry additional perioperative morbidity. Patients with significant obstructive sleep apnea should receive perioperative PAP therapy and the surgeons and particularly the anesthesiologist should be informed of the diagnosis and the severity of the sleep disordered breathing. The patient should be cautioned not to drive, work at heights, or operate dangerous or heavy equipment when tired or sleepy. Review and reiteration of good sleep hygiene measures should be pursued with any patient. Other causes of the patient's symptoms, including circadian rhythm disturbances, an underlying mood disorder, medication effect and/or an underlying medical problem cannot be ruled out based on this test. Clinical correlation is recommended. The patient and his referring provider will be notified of the test results. The patient will be seen in follow up in sleep clinic at [facility].  I certify that I have reviewed the raw data recording prior to the issuance of this report in accordance with the standards of the American Academy of Sleep Medicine (AASM).   INTERPRETING PHYSICIAN:   [name], MD, PhD  Board Certified in Neurology and Sleep Medicine  [facility] [address], [address] [place], [place] [address] [phone]  [phone]                 

## 2021-09-04 ENCOUNTER — Telehealth: Payer: Self-pay | Admitting: *Deleted

## 2021-09-04 ENCOUNTER — Encounter: Payer: Self-pay | Admitting: *Deleted

## 2021-09-04 NOTE — Telephone Encounter (Signed)
Orders to [employer] by fax with confirmation received.  Letter mailed to pt.

## 2021-09-04 NOTE — Telephone Encounter (Signed)
-----   Message from [name], MD sent at 09/03/2021  2:06 PM EST ----- Patient referred by Newt Minion, NP, seen by me on 07/18/21, patient had a HST on 08/26/21.    Please call and notify the patient that the recent home sleep test showed obstructive sleep apnea in the severe range. I recommend treatment for this in the form of autoPAP, which means, that we don't have to bring her in for a sleep study with CPAP, but will let her start using a so called autoPAP machine at home, through a DME company (of her choice, or as per insurance requirement). The DME representative will fit the patient with a mask of choice, educate her on how to use the machine, how to put the mask on, etc. I have placed an order in the chart. Please send the order to a local DME, talk to patient, send report to referring MD. Please also reinforce the need for compliance with treatment. We will need a FU in sleep clinic for 10 weeks post-PAP set up, please arrange that with me or one of our NPs.  Please send this as an urgent set up request. Thanks,   [name], MD, PhD [facility] ([facility])

## 2021-09-04 NOTE — Telephone Encounter (Signed)
I called the pt and discussed the sleep study results as noted below by Dr Frances Furbish. Pt aware her HST showed she has severe OSA. Pt agrees to start autopap  asap. She prefers to use [employer]. Pt aware of insurance compliance requirements including using the machine at least 4 hours every night and also being seen in the office for a initial f/u between 30 and 90 days after she starts using the machine. The pt was scheduled for Thurs 12/05/21 at 2:15 PM. Pt is aware to bring both machine & power cord to appt. Her questions were answered. She verbalized appreciation for the call.   Letter drafted to be mailed to pt. Results sent to referring provider.

## 2021-09-06 DIAGNOSIS — G4733 Obstructive sleep apnea (adult) (pediatric): Secondary | ICD-10-CM | POA: Diagnosis not present

## 2021-09-19 ENCOUNTER — Other Ambulatory Visit: Payer: Self-pay

## 2021-09-19 ENCOUNTER — Encounter (HOSPITAL_BASED_OUTPATIENT_CLINIC_OR_DEPARTMENT_OTHER): Payer: BC Managed Care – PPO | Admitting: Psychiatry

## 2021-09-19 DIAGNOSIS — F411 Generalized anxiety disorder: Secondary | ICD-10-CM

## 2021-09-19 DIAGNOSIS — F331 Major depressive disorder, recurrent, moderate: Secondary | ICD-10-CM | POA: Diagnosis not present

## 2021-09-19 DIAGNOSIS — R454 Irritability and anger: Secondary | ICD-10-CM | POA: Diagnosis not present

## 2021-09-19 MED ORDER — PROPRANOLOL HCL 20 MG PO TABS: 20.0000 mg | ORAL_TABLET | Freq: Every day | ORAL | 1 refills | Status: DC | PRN

## 2021-09-19 MED ORDER — ALPRAZOLAM 0.5 MG PO TABS: 0.5000 mg | ORAL_TABLET | Freq: Every evening | ORAL | 1 refills | Status: DC | PRN

## 2021-09-19 MED ORDER — BUSPIRONE HCL 10 MG PO TABS: 20.0000 mg | ORAL_TABLET | Freq: Two times a day (BID) | ORAL | 0 refills | Status: DC

## 2021-09-19 MED ORDER — ZIPRASIDONE HCL 60 MG PO CAPS: 60.0000 mg | ORAL_CAPSULE | Freq: Two times a day (BID) | ORAL | 0 refills | Status: DC

## 2021-09-19 MED ORDER — VENLAFAXINE HCL ER 75 MG PO CP24: 225.0000 mg | ORAL_CAPSULE | Freq: Every day | ORAL | 0 refills | Status: DC

## 2021-09-19 NOTE — Progress Notes (Signed)
Virtual Visit via Video Note  I connected with [name] on 09/19/21 at  8:15 AM EST by a video enabled telemedicine application and verified that I am speaking with the correct person using two identifiers.  Location: Patient: work Provider: office   I discussed the limitations of evaluation and management by telemedicine and the availability of in person appointments. The patient expressed understanding and agreed to proceed.  History of Present Illness: [name] reports she is hanging in there. She has restarted therapy. [name] is not sure if the increase in Geodon has helped her mood. Her anxiety at work remains high. At home the anxiety is a little more manageable but is overall still high. [name] anxiety spikes when she is alone and can't get a hold of someone to talk. Her irritability and anger are centered at work. It is a little better but she has come to realize it is more a reaction to feeling overwhelmed. Her depression is unchanged but she doesn't expect it to change it soon due to working thru the grief and other stressors. It helps to talk to her friends and have that support. [name] is having trouble adjusting to her CPAP and her sleep is poor. She has a hard time falling asleep. She is very tired most days. Her appetite is variable. [name] reports a small decrease in negative self thoughts. She also reports a decrease in passive thoughts of death to a few times a month. She denies SI/HI.    Observations/Objective: Psychiatric Specialty Exam: ROS  There were no vitals taken for this visit.There is no height or weight on file to calculate BMI.  General Appearance: Fairly Groomed and Neat  Eye Contact:  Good  Speech:  Clear and Coherent and Normal Rate  Volume:  Normal  Mood:  Anxious and Depressed  Affect:  Congruent  Thought Process:  Goal Directed, Linear, and Descriptions of Associations: Intact  Orientation:  Full (Time, Place, and Person)  Thought Content:  Logical   Suicidal Thoughts:  No  Homicidal Thoughts:  No  Memory:  Immediate;   Good  Judgement:  Good  Insight:  Good  Psychomotor Activity:  Normal  Concentration:  Concentration: Good  Recall:  Good  Fund of Knowledge:  Good  Language:  Good  Akathisia:  No  Handed:  Right  AIMS (if indicated):     Assets:  Communication Skills Desire for Improvement Financial Resources/Insurance Housing Social Support Talents/Skills Transportation Vocational/Educational  ADL's:  Intact  Cognition:  WNL  Sleep:        Assessment and Plan: Depression screen [facility] 2/9 09/19/2021 08/29/2021 07/04/2021 05/30/2021 05/01/2021  Decreased Interest 3 3 3 3 2   Down, Depressed, Hopeless 3 3 3 3 3   PHQ - 2 Score 6 6 6 6 5   Altered sleeping 3 3 3 2  0  Tired, decreased energy 3 3 3 3 3   Change in appetite 3 3 3 3 1   Feeling bad or failure about yourself  2 3 3 3 3   Trouble concentrating 3 3 3 3 1   Moving slowly or fidgety/restless 0 2 2 2 3   Suicidal thoughts 1 1 2 1 2   PHQ-9 Score 21 24 25 23 18   Difficult doing work/chores Extremely dIfficult Extremely dIfficult Extremely dIfficult Extremely dIfficult Extremely dIfficult  Some recent data might be hidden    Flowsheet Row Video Visit from 09/19/2021 in [facility] Video Visit from 08/29/2021 in [facility] Video Visit from  07/04/2021 in [facility]  C-SSRS RISK CATEGORY Error: Q3, 4, or 5 should not be populated when Q2 is No Error: Q3, 4, or 5 should not be populated when Q2 is No Error: Q3, 4, or 5 should not be populated when Q2 is No      - [name] will set up an appointment for an EKG with her PCP  - Marcedes has restarted therapy  - She does not want her meds changed at this time.   1. GAD (generalized anxiety disorder) - ALPRAZolam (XANAX) 0.5 MG tablet; Take 1 tablet (0.5 mg total) by mouth at bedtime as needed for anxiety or  sleep.  Dispense: 30 tablet; Refill: 1 - busPIRone (BUSPAR) 10 MG tablet; Take 2 tablets (20 mg total) by mouth 2 (two) times daily.  Dispense: 360 tablet; Refill: 0 - propranolol (INDERAL) 20 MG tablet; Take 1 tablet (20 mg total) by mouth daily as needed (anxiety).  Dispense: 30 tablet; Refill: 1 - venlafaxine XR (EFFEXOR-XR) 75 MG 24 hr capsule; Take 3 capsules (225 mg total) by mouth daily with breakfast.  Dispense: 270 capsule; Refill: 0 - ziprasidone (GEODON) 60 MG capsule; Take 1 capsule (60 mg total) by mouth 2 (two) times daily with a meal.  Dispense: 60 capsule; Refill: 0  2. MDD (major depressive disorder), recurrent episode, moderate (HCC) - busPIRone (BUSPAR) 10 MG tablet; Take 2 tablets (20 mg total) by mouth 2 (two) times daily.  Dispense: 360 tablet; Refill: 0 - venlafaxine XR (EFFEXOR-XR) 75 MG 24 hr capsule; Take 3 capsules (225 mg total) by mouth daily with breakfast.  Dispense: 270 capsule; Refill: 0 - ziprasidone (GEODON) 60 MG capsule; Take 1 capsule (60 mg total) by mouth 2 (two) times daily with a meal.  Dispense: 60 capsule; Refill: 0  3. Irritability and anger - ziprasidone (GEODON) 60 MG capsule; Take 1 capsule (60 mg total) by mouth 2 (two) times daily with a meal.  Dispense: 60 capsule; Refill: 0   Follow Up Instructions: In 2-3 months or sooner if needed   I discussed the assessment and treatment plan with the patient. The patient was provided an opportunity to ask questions and all were answered. The patient agreed with the plan and demonstrated an understanding of the instructions.   The patient was advised to call back or seek an in-person evaluation if the symptoms worsen or if the condition fails to improve as anticipated.  I provided 16 minutes of non-face-to-face time during this encounter.   [name], MD

## 2021-09-24 ENCOUNTER — Encounter (HOSPITAL_COMMUNITY): Payer: Self-pay

## 2021-09-24 ENCOUNTER — Other Ambulatory Visit: Payer: Self-pay

## 2021-09-24 ENCOUNTER — Ambulatory Visit (INDEPENDENT_AMBULATORY_CARE_PROVIDER_SITE_OTHER): Payer: BC Managed Care – PPO | Admitting: Psychiatry

## 2021-09-24 DIAGNOSIS — F411 Generalized anxiety disorder: Secondary | ICD-10-CM

## 2021-09-24 DIAGNOSIS — F331 Major depressive disorder, recurrent, moderate: Secondary | ICD-10-CM
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
## 2021-09-24 NOTE — Plan of Care (Signed)
Pt participated in development of plan 

## 2021-10-10 ENCOUNTER — Ambulatory Visit (HOSPITAL_COMMUNITY): Payer: BC Managed Care – PPO | Admitting: Psychiatry

## 2021-10-10 ENCOUNTER — Other Ambulatory Visit: Payer: Self-pay

## 2021-10-10 ENCOUNTER — Ambulatory Visit (INDEPENDENT_AMBULATORY_CARE_PROVIDER_SITE_OTHER): Payer: BC Managed Care – PPO | Admitting: Psychiatry

## 2021-10-10 DIAGNOSIS — F331 Major depressive disorder, recurrent, moderate: Secondary | ICD-10-CM

## 2021-10-10 DIAGNOSIS — F411 Generalized anxiety disorder: Secondary | ICD-10-CM | POA: Diagnosis not present
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
## 2021-10-29 ENCOUNTER — Telehealth: Payer: Self-pay | Admitting: "Endocrinology

## 2021-10-29 ENCOUNTER — Other Ambulatory Visit: Payer: Self-pay | Admitting: "Endocrinology

## 2021-10-29 MED ORDER — LEVOTHYROXINE SODIUM 300 MCG PO TABS: 300.0000 ug | ORAL_TABLET | Freq: Every day | ORAL | 1 refills | Status: DC

## 2021-10-29 NOTE — Telephone Encounter (Signed)
Pt made aware

## 2021-10-29 NOTE — Telephone Encounter (Signed)
Patient is wondering if you could call in just a prescription of 300 MCG of her levothyroxine instead of 100 MCG and then 200 MCG. She said it would save her some money of just buying one and not 2. She uses [facility] [address] Please Advise with pt

## 2021-10-31 ENCOUNTER — Telehealth (HOSPITAL_BASED_OUTPATIENT_CLINIC_OR_DEPARTMENT_OTHER): Payer: BC Managed Care – PPO | Admitting: Psychiatry

## 2021-10-31 ENCOUNTER — Other Ambulatory Visit: Payer: Self-pay

## 2021-10-31 DIAGNOSIS — F331 Major depressive disorder, recurrent, moderate: Secondary | ICD-10-CM | POA: Diagnosis not present

## 2021-10-31 DIAGNOSIS — R454 Irritability and anger: Secondary | ICD-10-CM

## 2021-10-31 DIAGNOSIS — F411 Generalized anxiety disorder: Secondary | ICD-10-CM | POA: Diagnosis not present

## 2021-10-31 MED ORDER — VENLAFAXINE HCL ER 75 MG PO CP24: 225.0000 mg | ORAL_CAPSULE | Freq: Every day | ORAL | 0 refills | Status: DC

## 2021-10-31 MED ORDER — ALPRAZOLAM 0.5 MG PO TABS: 0.5000 mg | ORAL_TABLET | Freq: Every evening | ORAL | 2 refills | Status: DC | PRN

## 2021-10-31 MED ORDER — ZIPRASIDONE HCL 60 MG PO CAPS: 60.0000 mg | ORAL_CAPSULE | Freq: Two times a day (BID) | ORAL | 0 refills | Status: DC

## 2021-10-31 MED ORDER — BUSPIRONE HCL 10 MG PO TABS: 20.0000 mg | ORAL_TABLET | Freq: Two times a day (BID) | ORAL | 0 refills | Status: DC

## 2021-10-31 MED ORDER — PROPRANOLOL HCL 20 MG PO TABS: 20.0000 mg | ORAL_TABLET | Freq: Every day | ORAL | 0 refills | Status: DC | PRN

## 2021-10-31 NOTE — Progress Notes (Signed)
Virtual Visit via Telephone Note  I connected with [name] on 10/31/21 at  8:30 AM EST by telephone and verified that I am speaking with the correct person using two identifiers. We attempted to connect by video multiple times but were not able to establish a connection and then her internet went out.   Location: Patient: in parked car Provider: office   I discussed the limitations, risks, security and privacy concerns of performing an evaluation and management service by telephone and the availability of in person appointments. I also discussed with the patient that there may be a patient responsible charge related to this service. The patient expressed understanding and agreed to proceed.   History of Present Illness: This past week she has been doing a little better. One day 2 weeks ago she forgot to take her morning meds and was very anxious, overwhelmed, overly emotional and crying. She was feeling hopeless and thinking she would be better off dead. She discovered she had accidentally missed a dose of morning meds. After taking the medications she felt better and more in control of herself. Work remains a Network engineer. Her anxiety stays high at work. She doesn't feel that "she is not performing the job the way it needs to be done in her current mental state". Even with the medications she is not able to focus on the paperwork or the kids the way she should. At times she gets frustrated with the problem children and wishes she could disiplince them the "old school way" but has no plans or intent to do so. She doesn't know what to do because she can't quit. [name] has not looked for any other work. She is physically limited due to walking with cane because she has bad knees. This prevents her from applying for other jobs. This also contributes to her anxiety. She does not take any PRN meds for anxiety during the daytime. [name] is fatigued during the day. She is using her CPAP at night and  is sleeping well with Xanax. [name] gets depressed when she is alone such as at night and on the weekends. Over the last 1 week the depression has not been as bad. She was excited because she got a new puppy. [name] is focused on taking care of and training the puppy. Over the last 2 weeks she has been depressed 1-2 day total and 1 of those was when she missed her meds accidentally. She is enjoying things like spending time with her friends and puppy. She denies isolation and has been making an active effort to spend time with friends. [name] gets down on herself some days but is able to talk with a friend. Her appetite is increased and is snacking all morning trying to stay awake at work. She needs to work on weight loss. She denies any SI/HI.    Observations/Objective:  General Appearance: unable to assess  Eye Contact:  unable to assess  Speech:  Clear and Coherent and Normal Rate  Volume:  Normal  Mood:  Anxious and Depressed  Affect:  Congruent  Thought Process:  Goal Directed, Linear, and Descriptions of Associations: Intact  Orientation:  Full (Time, Place, and Person)  Thought Content:  Logical  Suicidal Thoughts:  No  Homicidal Thoughts:  No  Memory:  Immediate;   Good  Judgement:  Fair  Insight:  Good  Psychomotor Activity: unable to assess  Concentration:  Concentration: Good  Recall:  Good  Fund of Knowledge:  Good  Language:  Good  Akathisia:  unable to assess  Handed:  unable to assess  AIMS (if indicated):     Assets:  Communication Skills Desire for Improvement Financial Resources/Insurance Social Support Talents/Skills Transportation Vocational/Educational  ADL's:  unable to assess  Cognition:  WNL  Sleep:        Assessment and Plan: Depression screen [facility] 2/9 10/31/2021 09/19/2021 08/29/2021 07/04/2021 05/30/2021  Decreased Interest 1 3 3 3 3   Down, Depressed, Hopeless 1 3 3 3 3   PHQ - 2 Score 2 6 6 6 6   Altered sleeping 0 3 3 3 2   Tired, decreased energy 3 3  3 3 3   Change in appetite 3 3 3 3 3   Feeling bad or failure about yourself  1 2 3 3 3   Trouble concentrating 3 3 3 3 3   Moving slowly or fidgety/restless 0 0 2 2 2   Suicidal thoughts 0 1 1 2 1   PHQ-9 Score 12 21 24 25 23   Difficult doing work/chores Very difficult Extremely dIfficult Extremely dIfficult Extremely dIfficult Extremely dIfficult  Some recent data might be hidden    Flowsheet Row Video Visit from 10/31/2021 in [facility] Erroneous Encounter from 09/19/2021 in [facility] Video Visit from 08/29/2021 in [facility]  C-SSRS RISK CATEGORY Error: Q3, 4, or 5 should not be populated when Q2 is No Error: Q3, 4, or 5 should not be populated when Q2 is No Error: Q3, 4, or 5 should not be populated when Q2 is No      - work is a and contributes to her anxiety. She has not been looking into other job options. We discussed the importance of learning to deal with the stress and anxiety thru therapy or changing jobs. She verbalized understanding  - her daytime fatigue is ongoing. We discussed how Buspar could be contributing but she doesn't want to stop it because her depression is starting to improve. She will try to decrease or stop Melatonin  - she was engaging in therapy every 2 weeks but the holidays threw off the schedule. She is going to call back to reschedule today. We discussed the importance of learning coping skills for anxiety management as meds have not been effective  - she wants to continue Propranolol as it seems to help a little   1. GAD (generalized anxiety disorder) - ALPRAZolam (XANAX) 0.5 MG tablet; Take 1 tablet (0.5 mg total) by mouth at bedtime as needed for anxiety or sleep.  Dispense: 30 tablet; Refill: 2 - busPIRone (BUSPAR) 10 MG tablet; Take 2 tablets (20 mg total) by mouth 2 (two) times daily.  Dispense: 360 tablet; Refill: 0 -  propranolol (INDERAL) 20 MG tablet; Take 1 tablet (20 mg total) by mouth daily as needed (anxiety).  Dispense: 90 tablet; Refill: 0 - venlafaxine XR (EFFEXOR-XR) 75 MG 24 hr capsule; Take 3 capsules (225 mg total) by mouth daily with breakfast.  Dispense: 270 capsule; Refill: 0 - ziprasidone (GEODON) 60 MG capsule; Take 1 capsule (60 mg total) by mouth 2 (two) times daily with a meal.  Dispense: 180 capsule; Refill: 0  2. MDD (major depressive disorder), recurrent episode, moderate (HCC) - busPIRone (BUSPAR) 10 MG tablet; Take 2 tablets (20 mg total) by mouth 2 (two) times daily.  Dispense: 360 tablet; Refill: 0 - venlafaxine XR (EFFEXOR-XR) 75 MG 24 hr capsule; Take 3 capsules (225 mg total) by mouth daily with breakfast.  Dispense:  270 capsule; Refill: 0 - ziprasidone (GEODON) 60 MG capsule; Take 1 capsule (60 mg total) by mouth 2 (two) times daily with a meal.  Dispense: 180 capsule; Refill: 0  3. Irritability and anger - ziprasidone (GEODON) 60 MG capsule; Take 1 capsule (60 mg total) by mouth 2 (two) times daily with a meal.  Dispense: 180 capsule; Refill: 0     Follow Up Instructions: In 2-3 months or sooner if needed   I discussed the assessment and treatment plan with the patient. The patient was provided an opportunity to ask questions and all were answered. The patient agreed with the plan and demonstrated an understanding of the instructions.   The patient was advised to call back or seek an in-person evaluation if the symptoms worsen or if the condition fails to improve as anticipated.  I provided 26 minutes of non-face-to-face time during this encounter.   Oletta DarterSalina Eowyn Tabone, MD

## 2021-11-13 DIAGNOSIS — N959 Unspecified menopausal and perimenopausal disorder: Secondary | ICD-10-CM | POA: Insufficient documentation

## 2021-11-18 ENCOUNTER — Telehealth: Payer: Self-pay | Admitting: "Endocrinology

## 2021-11-18 NOTE — Telephone Encounter (Signed)
Pt states in the morning she is having overheating spells where she is getting really sweaty, nauseous, and lightheaded. States it is every morning for 2 hours. Her pcp told her to contact us.

## 2021-11-18 NOTE — Telephone Encounter (Signed)
Pt states she is not sure when she can get them done but she will try as soon as she can and will let us know once she does so we can be looking for the results.

## 2021-11-23 LAB — TSH: TSH: 0.024 u[IU]/mL — ABNORMAL LOW (ref 0.450–4.500)

## 2021-11-23 LAB — T4, FREE: Free T4: 1.99 ng/dL — ABNORMAL HIGH (ref 0.82–1.77)

## 2021-11-23 LAB — VITAMIN D 25 HYDROXY (VIT D DEFICIENCY, FRACTURES): Vit D, 25-Hydroxy: 39.9 ng/mL (ref 30.0–100.0)

## 2021-11-25 ENCOUNTER — Other Ambulatory Visit: Payer: Self-pay | Admitting: "Endocrinology

## 2021-11-25 MED ORDER — LEVOTHYROXINE SODIUM 75 MCG PO TABS: 75.0000 ug | ORAL_TABLET | Freq: Every day | ORAL | 1 refills | Status: DC

## 2021-11-25 MED ORDER — LEVOTHYROXINE SODIUM 200 MCG PO TABS: 200.0000 ug | ORAL_TABLET | Freq: Every day | ORAL | 1 refills | Status: AC

## 2021-11-25 NOTE — Telephone Encounter (Signed)
Discussed with pt, understanding voiced. She stated she needs a Rx sent in.

## 2021-11-25 NOTE — Telephone Encounter (Signed)
Pt called and said she had completed her labs and wanted the results over the phone, I informed her once she completed them Dr Fransico Him wanted her to move her appt up and come in sooner to discuss results. Pt states she can not come in sooner before appt 2/23.

## 2021-11-27 ENCOUNTER — Ambulatory Visit (INDEPENDENT_AMBULATORY_CARE_PROVIDER_SITE_OTHER): Payer: BC Managed Care – PPO | Admitting: Psychiatry

## 2021-11-27 ENCOUNTER — Other Ambulatory Visit: Payer: Self-pay

## 2021-11-27 DIAGNOSIS — F331 Major depressive disorder, recurrent, moderate: Secondary | ICD-10-CM

## 2021-11-27 DIAGNOSIS — F411 Generalized anxiety disorder: Secondary | ICD-10-CM | POA: Diagnosis not present
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
## 2021-12-05 ENCOUNTER — Ambulatory Visit: Payer: BC Managed Care – PPO | Admitting: "Endocrinology

## 2021-12-05 ENCOUNTER — Encounter: Payer: Self-pay | Admitting: "Endocrinology

## 2021-12-05 ENCOUNTER — Ambulatory Visit: Payer: BC Managed Care – PPO | Admitting: Neurology

## 2021-12-05 VITALS — BP 128/76 | HR 76 | Ht 65.0 in | Wt >= 6400 oz

## 2021-12-05 DIAGNOSIS — E039 Hypothyroidism, unspecified: Secondary | ICD-10-CM | POA: Diagnosis not present

## 2021-12-05 DIAGNOSIS — E559 Vitamin D deficiency, unspecified: Secondary | ICD-10-CM

## 2021-12-05 NOTE — Patient Instructions (Signed)

## 2021-12-05 NOTE — Progress Notes (Signed)
12/05/2021, 1:52 PM  Endocrinology follow-up note   [name] is a 46 y.o.-year-old female patient being seen in follow-up for management of hypothyroidism, also interested in discussing weight loss options.   Referred by: Anita Gravel, MD.   Past Medical History:  Diagnosis Date   Allergy    Anxiety    Arthritis    Asthma    Breast nodule 01/02/2015   Depression    Fatigue    GERD (gastroesophageal reflux disease)    Herpes    HPV in female    Obesity    PMDD (premenstrual dysphoric disorder)    Thyroid disease    hypothryoidism   Trauma    Vaginal Pap smear, abnormal    Vitamin D deficiency     Past Surgical History:  Procedure Laterality Date   CESAREAN SECTION     CHOLECYSTECTOMY     TONSILECTOMY/ADENOIDECTOMY WITH MYRINGOTOMY     WISDOM TOOTH EXTRACTION      Social History   Socioeconomic History   Marital status: Married    Spouse name: Not on file   Number of children: 2   Years of education: Not on file   Highest education level: Associate degree: occupational, Hotel manager, or vocational program  Occupational History   Not on file  Tobacco Use   Smoking status: Former   Smokeless tobacco: Never   Tobacco comments:    Smoked "on occasion around friends" in high school "because it looked cool"  Vaping Use   Vaping Use: Never used  Substance and Sexual Activity   Alcohol use: Yes    Comment: occ   Drug use: No   Sexual activity: Yes    Birth control/protection: Pill    Comment: takes norethindrone acetate for heavy menses  Other Topics Concern   Not on file  Social History Narrative   Social Hx:   Current living situation- living in [name], son is 50/50 custody with biological father, daughter lives alone. Patient's mother lives with her.    Raised in [name] by mom and dad   Siblings- 2 brothers and pt is the  youngest. She is 70 yrs younger than her youngest brother   Schooling- associates degree in early childhood   Married- yes - currently in 41rd marriage   Kids- 2 from 1st marriage      Legal issues- denies      Caffeine: all day long everyday, unable to quantify    Social Determinants of Health   Financial Resource Strain: Not on file  Food Insecurity: Not on file  Transportation Needs: Not on file  Physical Activity: Not on file  Stress: Not on file  Social Connections: Not on file    Family History  Problem Relation Age of Onset   Hypertension Mother    Arthritis Mother    Anxiety disorder Mother  Lung cancer Mother    Sleep apnea Mother    Asthma Father    COPD Father    Arthritis Father    Chronic bronchitis Father    Hypertension Father    Hyperlipidemia Father    Anxiety disorder Father    Depression Father    Alcohol abuse Father    Drug abuse Brother    Alcohol abuse Brother    Hypertension Brother    Mental illness Brother    Depression Brother    Hyperlipidemia Brother    Hearing loss Brother    Asthma Brother    Learning disabilities Brother    Asthma Maternal Grandmother    Arthritis Maternal Grandmother    Hypertension Maternal Grandmother    Congestive Heart Failure Maternal Grandmother    Diabetes Maternal Grandfather    Stroke Maternal Grandfather    ADD / ADHD Son    Cancer Maternal Aunt    Cancer Maternal Uncle    Sleep apnea Maternal Uncle    Cancer Paternal Aunt    Cancer Paternal Uncle     Outpatient Encounter Medications as of 12/05/2021  Medication Sig   ALPRAZolam (XANAX) 0.5 MG tablet Take 1 tablet (0.5 mg total) by mouth at bedtime as needed for anxiety or sleep.   augmented betamethasone dipropionate (DIPROLENE-AF) 0.05 % cream    busPIRone (BUSPAR) 10 MG tablet Take 2 tablets (20 mg total) by mouth 2 (two) times daily.   cetirizine (ZYRTEC) 10 MG tablet Take 10 mg by mouth daily.   Cholecalciferol (VITAMIN D3 PO) Take 1  tablet by mouth daily in the afternoon.   ibuprofen (ADVIL) 600 MG tablet Take 600 mg by mouth 3 (three) times daily. As needed   levalbuterol (XOPENEX HFA) 45 MCG/ACT inhaler Inhale into the lungs every 4 (four) hours as needed for wheezing.   levothyroxine (SYNTHROID) 200 MCG tablet Take 1 tablet (200 mcg total) by mouth daily.   levothyroxine (SYNTHROID) 75 MCG tablet Take 1 tablet (75 mcg total) by mouth daily before breakfast.   MELATONIN PO Take by mouth at bedtime. (Patient not taking: Reported on 12/05/2021)   metFORMIN (GLUCOPHAGE) 500 MG tablet Take by mouth 2 (two) times daily with a meal. (Patient not taking: Reported on 12/05/2021)   Misc Natural Products (CRANBERRY/PROBIOTIC PO) Take by mouth daily. Cranberry/Probiotic/Prebiotic Blend-2 daily   montelukast (SINGULAIR) 10 MG tablet Take 10 mg by mouth daily.    Multiple Vitamins-Minerals (WOMENS MULTI VITAMIN & MINERAL PO) Take by mouth daily.   norethindrone (AYGESTIN) 5 MG tablet TAKE 2 TABLETS BY MOUTH DAILY   propranolol (INDERAL) 20 MG tablet Take 1 tablet (20 mg total) by mouth daily as needed (anxiety).   venlafaxine XR (EFFEXOR-XR) 75 MG 24 hr capsule Take 3 capsules (225 mg total) by mouth daily with breakfast.   ziprasidone (GEODON) 60 MG capsule Take 1 capsule (60 mg total) by mouth 2 (two) times daily with a meal.   No facility-administered encounter medications on file as of 12/05/2021.    ALLERGIES: Allergies  Allergen Reactions   Latex    Naproxen    Shrimp [Shellfish Allergy] Other (See Comments)    Intolerance    VACCINATION STATUS: Immunization History  Administered Date(s) Administered   Moderna Sars-Covid-2 Vaccination 12/12/2019, 01/11/2020     HPI    [name]  is a patient with the above medical history. she was diagnosed  with hypothyroidism at approximate age of 17 years which required treatment with levothyroxine.  Etiology  of her hypothyroidism was recently confirmed to be Hashimoto's  thyroiditis. -She is currently on levothyroxine for hypothyroidism.  Her dose was lowered to 275 in the interim due to high levels of thyroid hormone on blood work.  She has no [place] complaints today.  She did have transient symptoms including heat intolerance which may be necessary to do her labs before her scheduled appointment.    -She presents with 10 pounds of weight loss since last visit.  See notes from previous visits.     She also has chronic complaints of fatigue, depression, mood swings/disorder.  She is currently on mood stabilizers, antidepressants.  Patient with prior history of eating disorders.   she has family history of  thyroid disorders in her uncle who has an identified thyroid dysfunction.  No family history of thyroid cancer.  No history of  radiation therapy to head or neck. No recent use of iodine supplements.  She is on polypharmacy including psychotropics: BuSpar 15 mg twice daily, Depakote 500 mg p.o. daily, Effexor 225 mg p.o. daily.   Reportedly, she underwent sleep studies which did not reveal sleep apnea.   ROS: Limited as above.   Physical Exam: BP 128/76    Pulse 76    Ht 5\' 5"  (1.651 m)    Wt (!) 514 lb 12.8 oz (233.5 kg)    BMI 85.67 kg/m  Wt Readings from Last 3 Encounters:  12/05/21 (!) 514 lb 12.8 oz (233.5 kg)  07/18/21 (!) 524 lb (237.7 kg)  06/04/21 (!) 522 lb (236.8 kg)     CMP ( most recent) CMP     Component Value Date/Time   NA 137 11/14/2009 1255   K 3.7 11/14/2009 1255   CL 102 11/14/2009 1255   CO2 29 11/14/2009 1255   GLUCOSE 89 11/14/2009 1255   BUN 5 (L) 11/14/2009 1255   CREATININE 0.71 11/14/2009 1255   CALCIUM 9.2 11/14/2009 1255   GFRNONAA >60 11/14/2009 1255   GFRAA  11/14/2009 1255    >60        The eGFR has been calculated using the MDRD equation. This calculation has not been validated in all clinical situations. eGFR's persistently <60 mL/min signify possible Chronic Kidney Disease.   Recent Results (from  the past 2160 hour(s))  TSH     Status: Abnormal   Collection Time: 11/22/21  8:23 AM  Result Value Ref Range   TSH 0.024 (L) 0.450 - 4.500 uIU/mL  T4, free     Status: Abnormal   Collection Time: 11/22/21  8:23 AM  Result Value Ref Range   Free T4 1.99 (H) 0.82 - 1.77 ng/dL  VITAMIN D 25 Hydroxy (Vit-D Deficiency, Fractures)     Status: None   Collection Time: 11/22/21  8:23 AM  Result Value Ref Range   Vit D, 25-Hydroxy 39.9 30.0 - 100.0 ng/mL    Comment: Vitamin D deficiency has been defined by the Institute of Medicine and an Endocrine Society practice guideline as a level of serum 25-OH vitamin D less than 20 ng/mL (1,2). The Endocrine Society went on to further define vitamin D insufficiency as a level between 21 and 29 ng/mL (2). 1. IOM (Institute of Medicine). 2010. Dietary reference    intakes for calcium and D. [place]: The    2011. 2. Holick MF, Binkley [place], Bischoff-Ferrari HA, et al.    Evaluation, treatment, and prevention of vitamin D    deficiency: an Endocrine Society clinical practice  guideline. JCEM. 2011 Jul; 96(7):1911-30.     Thyroid ultrasound on July 26, 2020: Right lobe 4.8 cm with no nodules. Left lobe 4.5 cm with no nodules.   ASSESSMENT: 1. Hypothyroidism 2.  Morbid obesity 3.  Vitamin D  Deficiency 4.  Hypertension  PLAN:  The etiology of her hypothyroidism is confirmed to be Hashimoto's thyroiditis.  Her ultrasound is unremarkable, will not need any biopsy or surgery at this time. -Her previsit thyroid function tests are consistent with slight over replacement.  Her dose was lowered to 275 mcg from 300 mcg, advised to continue.    - We discussed about the correct intake of her thyroid hormone, on empty stomach at fasting, with water, separated by at least 30 minutes from breakfast and other medications,  and separated by more than 4 hours from calcium, iron, multivitamins, acid reflux medications (PPIs). -Patient  is made aware of the fact that thyroid hormone replacement is needed for life, dose to be adjusted by periodic monitoring of thyroid function tests.   Vitamin D deficiency: She is advised to continue her vitamin D supplements with vitamin D3 5000 units daily.  She is also on Os-Cal 500-200 mg.  Daily. She will have repeat 25-hydroxy vitamin D level measured before her next visit.  Regarding her morbid obesity: She will benefit the most from lifestyle medicine.   - she acknowledges that there is a room for improvement in her food and drink choices. - Suggestion is made for her to avoid simple carbohydrates  from her diet including Cakes, Sweet Desserts, Ice Cream, Soda (diet and regular), Sweet Tea, Candies, Chips, Cookies, Store Bought Juices, Alcohol , Artificial Sweeteners,  Coffee Creamer, and "Sugar-free" Products, Lemonade. This will help patient to have more stable blood glucose profile and potentially avoid unintended weight gain.  The following Lifestyle Medicine recommendations according to [place]  [facility]) were discussed and and offered to patient and she  agrees to start the journey:  A. Whole Foods, Plant-Based Nutrition comprising of fruits and vegetables, plant-based proteins, whole-grain carbohydrates was discussed in detail with the patient.   A list for source of those nutrients were also provided to the patient.  Patient will use only water or unsweetened tea for hydration. B.  The need to stay away from risky substances including alcohol, smoking; obtaining 7 to 9 hours of restorative sleep, at least 150 minutes of moderate intensity exercise weekly, the importance of healthy social connections,  and stress management techniques were discussed. C.  A full color page of  Calorie density of various food groups per pound showing examples of each food groups was provided to the patient.  She was previously given the contact number for bariatric  surgery.  She is advised to maintain close follow-up with her PMD.   I spent 31 minutes in the care of the patient today including review of labs from Thyroid Function, CMP, and other relevant labs ; imaging/biopsy records (current and previous including abstractions from other facilities); face-to-face time discussing  her lab results and symptoms, medications doses, her options of short and long term treatment based on the latest standards of care / guidelines;   and documenting the encounter.  Roberts Gaudy Dinges  participated in the discussions, expressed understanding, and voiced agreement with the above plans.  All questions were answered to her satisfaction. she is encouraged to contact clinic should she have any questions or concerns prior to her return visit.    Return  in about 3 months (around 03/04/2022) for F/U with Pre-visit Labs.  Glade Lloyd, MD [facility] [address] [place], [place] [address] Phone: [phone]  Fax: [phone]   12/05/2021, 1:52 PM  This note was partially dictated with voice recognition software. Similar sounding words can be transcribed inadequately or may not  be corrected upon review.

## 2021-12-11 ENCOUNTER — Ambulatory Visit (INDEPENDENT_AMBULATORY_CARE_PROVIDER_SITE_OTHER): Payer: BC Managed Care – PPO | Admitting: Psychiatry

## 2021-12-11 ENCOUNTER — Other Ambulatory Visit: Payer: Self-pay

## 2021-12-11 DIAGNOSIS — F331 Major depressive disorder, recurrent, moderate: Secondary | ICD-10-CM | POA: Diagnosis not present

## 2021-12-11 DIAGNOSIS — F411 Generalized anxiety disorder: Secondary | ICD-10-CM | POA: Diagnosis not present
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
## 2021-12-12 DIAGNOSIS — G4710 Hypersomnia, unspecified: Secondary | ICD-10-CM | POA: Insufficient documentation

## 2021-12-25 ENCOUNTER — Ambulatory Visit (INDEPENDENT_AMBULATORY_CARE_PROVIDER_SITE_OTHER): Payer: BC Managed Care – PPO | Admitting: Psychiatry

## 2021-12-25 ENCOUNTER — Other Ambulatory Visit: Payer: Self-pay

## 2021-12-25 DIAGNOSIS — F331 Major depressive disorder, recurrent, moderate: Secondary | ICD-10-CM | POA: Diagnosis not present

## 2021-12-25 DIAGNOSIS — F411 Generalized anxiety disorder: Secondary | ICD-10-CM
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
## 2021-12-25 NOTE — Plan of Care (Signed)
Pt participated in review of tx plan ?Problem: Depression CCP Problem  depressed mood, negative thoughts about self  ?Goal: I want to help combating negative thoughts such as not being good enough AEB decreasing these thoughts from daily to 3 or less times per week consistently for 3 consecutive weeks  ?Outcome: Progressing ?Goal: LTG: Analyse WILL SCORE LESS THAN 10 ON THE PATIENT HEALTH QUESTIONNAIRE (PHQ-9) ?Outcome: Progressing ?Goal: STG: Arnisha WILL IDENTIFY 3 COGNITIVE PATTERNS AND BELIEFS THAT SUPPORT DEPRESSION ?Outcome: Progressing ?Goal: STG: Edith WILL PRACTICE BEHAVIORAL ACTIVATION SKILLS  2-3 TIMES PER WEEK FOR THE NEXT 12 WEEKS ?Outcome: Progressing ?  ?

## 2021-12-26 ENCOUNTER — Other Ambulatory Visit: Payer: Self-pay

## 2021-12-26 ENCOUNTER — Telehealth (HOSPITAL_BASED_OUTPATIENT_CLINIC_OR_DEPARTMENT_OTHER): Payer: BC Managed Care – PPO | Admitting: Psychiatry

## 2021-12-26 DIAGNOSIS — F331 Major depressive disorder, recurrent, moderate: Secondary | ICD-10-CM

## 2021-12-26 DIAGNOSIS — F411 Generalized anxiety disorder: Secondary | ICD-10-CM | POA: Diagnosis not present

## 2021-12-26 DIAGNOSIS — R454 Irritability and anger: Secondary | ICD-10-CM | POA: Diagnosis not present

## 2021-12-26 MED ORDER — VENLAFAXINE HCL ER 75 MG PO CP24: 225.0000 mg | ORAL_CAPSULE | Freq: Every day | ORAL | 0 refills | Status: DC

## 2021-12-26 MED ORDER — ZIPRASIDONE HCL 60 MG PO CAPS: 60.0000 mg | ORAL_CAPSULE | Freq: Two times a day (BID) | ORAL | 0 refills | Status: DC

## 2021-12-26 MED ORDER — BUSPIRONE HCL 10 MG PO TABS: 20.0000 mg | ORAL_TABLET | Freq: Two times a day (BID) | ORAL | 0 refills | Status: DC

## 2021-12-26 MED ORDER — ALPRAZOLAM 0.5 MG PO TABS: 0.5000 mg | ORAL_TABLET | Freq: Every evening | ORAL | 2 refills | Status: DC | PRN

## 2021-12-26 MED ORDER — PROPRANOLOL HCL 20 MG PO TABS: 20.0000 mg | ORAL_TABLET | Freq: Every day | ORAL | 0 refills | Status: DC | PRN

## 2021-12-26 NOTE — Progress Notes (Signed)
Virtual Visit via Video Note ? ?I connected with [name] on 12/26/21 at  8:30 AM EDT by a video enabled telemedicine application and verified that I am speaking with the correct person using two identifiers. ? ?Location: ?Patient: work ?Provider: office ?  ?I discussed the limitations of evaluation and management by telemedicine and the availability of in person appointments. The patient expressed understanding and agreed to proceed. ? ?History of Present Illness: ?[name] has been doing better. She has been having a decrease in frequency of bad days. [name] has anxiety everyday but is managing it better. Her meds are helping. She had a panic attack like episode when she was sick on Monday and couldn't take her meds. She was better the next day. The depression is always present but she is managing it better. Therapy is going well and she has been using the coping skills she is learning. Her sleep is good with her CPCP. Her energy during the day is still on low side. She is working with her PCP and had labs done recently. Her thyroid medication was adjusted but it and hasn't improved her energy level. Her appetite is good and she has been trying to make healthier choices. Her anhedonia is a little better. She is social and denies isolation. Her concentration remains poor but is a little better. Paperwork makes her feels very overwhelmed and anxious. Her negative self thoughts are less intense and are better than a few months ago. [name] is able to move on quickly when she have it.  Her meds help her with that a lot. She denies passive thoughts of death in the last 3 days. The last time was on Monday when she missed her meds due to being sick. The thought is brief and fleeting and occurs about once a week now. She denies SI/HI. Her anger and irritability is somewhat better. It makes occurs at work and she does feel she is handling it better. [name] takes Xanax every night and it helps her to relax.  ?   ?Observations/Objective: ?Psychiatric Specialty Exam: ?ROS  ?There were no vitals taken for this visit.There is no height or weight on file to calculate BMI.  ?General Appearance: Fairly Groomed and Neat  ?Eye Contact:  Good  ?Speech:  Clear and Coherent and Normal Rate  ?Volume:  Normal  ?Mood:  Anxious and Depressed  ?Affect:  Full Range.righter than at previous visits  ?Thought Process:  Goal Directed, Linear, and Descriptions of Associations: Intact  ?Orientation:  Full (Time, Place, and Person)  ?Thought Content:  Logical  ?Suicidal Thoughts:  No  ?Homicidal Thoughts:  No  ?Memory:  Immediate;   Good  ?Judgement:  Good  ?Insight:  Good  ?Psychomotor Activity:  Normal  ?Concentration:  Concentration: Good  ?Recall:  Good  ?Fund of Knowledge:  Good  ?Language:  Good  ?Akathisia:  No  ?Handed:  Right  ?AIMS (if indicated):     ?Assets:  Communication Skills ?Desire for Improvement ?Financial Resources/Insurance ?Housing ?Leisure Time ?Resilience ?Social Support ?Talents/Skills ?Transportation ?Vocational/Educational  ?ADL's:  Intact  ?Cognition:  WNL  ?Sleep:     ? ? ? ?Assessment and Plan: ?Depression screen [facility] 2/9 12/26/2021 12/25/2021 10/31/2021 09/19/2021 08/29/2021  ?Decreased Interest 1 1 1 3 3   ?Down, Depressed, Hopeless 3 1 1 3 3   ?PHQ - 2 Score 4 2 2 6 6   ?Altered sleeping 0 2 0 3 3  ?Tired, decreased energy 3 3 3 3 3   ?Change in appetite  0 1 3 3 3   ?Feeling bad or failure about yourself  1 1 1 2 3   ?Trouble concentrating 1 1 3 3 3   ?Moving slowly or fidgety/restless 0 0 0 0 2  ?Suicidal thoughts 1 1 0 1 1  ?PHQ-9 Score 10 11 12 21 24   ?Difficult doing work/chores Very difficult - Very difficult Extremely dIfficult Extremely dIfficult  ?Some recent data might be hidden  ? ? ?Flowsheet Row Video Visit from 12/26/2021 in [facility] Video Visit from 10/31/2021 in [facility] Erroneous Encounter from 09/19/2021 in [facility]  ?C-SSRS RISK CATEGORY Error: Q3, 4, or 5 should not be populated when Q2 is No Error: Q3, 4, or 5 should not be populated when Q2 is No Error: Q3, 4, or 5 should not be populated when Q2 is No  ? ?  ? ?- [name] has not had time to get her EKG yet but plans to do soon ? ?- reviewed labs 11/22/21 TSH low and PCP and have already adjusted her medication ? ?1. GAD (generalized anxiety disorder) ?- ALPRAZolam (XANAX) 0.5 MG tablet; Take 1 tablet (0.5 mg total) by mouth at bedtime as needed for anxiety or sleep.  Dispense: 30 tablet; Refill: 2 ?- busPIRone (BUSPAR) 10 MG tablet; Take 2 tablets (20 mg total) by mouth 2 (two) times daily.  Dispense: 360 tablet; Refill: 0 ?- propranolol (INDERAL) 20 MG tablet; Take 1 tablet (20 mg total) by mouth daily as needed (anxiety).  Dispense: 90 tablet; Refill: 0 ?- venlafaxine XR (EFFEXOR-XR) 75 MG 24 hr capsule; Take 3 capsules (225 mg total) by mouth daily with breakfast.  Dispense: 270 capsule; Refill: 0 ?- ziprasidone (GEODON) 60 MG capsule; Take 1 capsule (60 mg total) by mouth 2 (two) times daily with a meal.  Dispense: 180 capsule; Refill: 0 ? ?2. MDD (major depressive disorder), recurrent episode, moderate (HCC) ?- busPIRone (BUSPAR) 10 MG tablet; Take 2 tablets (20 mg total) by mouth 2 (two) times daily.  Dispense: 360 tablet; Refill: 0 ?- venlafaxine XR (EFFEXOR-XR) 75 MG 24 hr capsule; Take 3 capsules (225 mg total) by mouth daily with breakfast.  Dispense: 270 capsule; Refill: 0 ?- ziprasidone (GEODON) 60 MG capsule; Take 1 capsule (60 mg total) by mouth 2 (two) times daily with a meal.  Dispense: 180 capsule; Refill: 0 ? ?3. Irritability and anger ?- ziprasidone (GEODON) 60 MG capsule; Take 1 capsule (60 mg total) by mouth 2 (two) times daily with a meal.  Dispense: 180 capsule; Refill: 0 ? ? ? ?Follow Up Instructions: ?In 2-3 months or sooner if needed ?  ?I discussed the assessment and treatment plan with the patient. The patient was  provided an opportunity to ask questions and all were answered. The patient agreed with the plan and demonstrated an understanding of the instructions. ?  ?The patient was advised to call back or seek an in-person evaluation if the symptoms worsen or if the condition fails to improve as anticipated. ? ?I provided 16 minutes of non-face-to-face time during this encounter. ? ? ?12/28/2021, MD ? ?

## 2022-01-02 DIAGNOSIS — K580 Irritable bowel syndrome with diarrhea: Secondary | ICD-10-CM | POA: Insufficient documentation

## 2022-01-06 ENCOUNTER — Telehealth: Payer: Self-pay | Admitting: Neurology

## 2022-01-06 NOTE — Telephone Encounter (Signed)
Setup date was 09/06/2021. The end of her window would have been 12/07/20. Please reschedule pt for as soon as possible. You can even set her up for a video visit on mychart because we have the data via wireless technology. She can see any NP or Dr Frances Furbish, next available.  ?

## 2022-01-06 NOTE — Telephone Encounter (Signed)
Pt is asking to r/s her CPAP f/u date.  Pt is asking for a call with the date she started her CPAP usage in order to r/s her appointment, please call ?

## 2022-01-08 ENCOUNTER — Ambulatory Visit: Payer: BC Managed Care – PPO | Admitting: Neurology

## 2022-01-14 ENCOUNTER — Ambulatory Visit (INDEPENDENT_AMBULATORY_CARE_PROVIDER_SITE_OTHER): Payer: BC Managed Care – PPO | Admitting: Psychiatry

## 2022-01-14 DIAGNOSIS — F331 Major depressive disorder, recurrent, moderate: Secondary | ICD-10-CM | POA: Diagnosis not present

## 2022-01-14 DIAGNOSIS — F411 Generalized anxiety disorder: Secondary | ICD-10-CM

## 2022-01-14 NOTE — Telephone Encounter (Signed)
Pt was called, she is unable to come before the month of May.  Pt accepted 1st available appointment and has been informed she needs to bring her CPAP and power cord.  This is FYI, no call back requested. ?
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
## 2022-01-28 ENCOUNTER — Ambulatory Visit (INDEPENDENT_AMBULATORY_CARE_PROVIDER_SITE_OTHER): Payer: BC Managed Care – PPO | Admitting: Psychiatry

## 2022-01-28 DIAGNOSIS — F331 Major depressive disorder, recurrent, moderate: Secondary | ICD-10-CM | POA: Diagnosis not present

## 2022-01-28 DIAGNOSIS — F411 Generalized anxiety disorder: Secondary | ICD-10-CM
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
## 2022-02-11 ENCOUNTER — Ambulatory Visit (INDEPENDENT_AMBULATORY_CARE_PROVIDER_SITE_OTHER): Payer: BC Managed Care – PPO | Admitting: Psychiatry

## 2022-02-11 DIAGNOSIS — F411 Generalized anxiety disorder: Secondary | ICD-10-CM | POA: Diagnosis not present

## 2022-02-11 DIAGNOSIS — F331 Major depressive disorder, recurrent, moderate: Secondary | ICD-10-CM | POA: Diagnosis not present
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
## 2022-02-17 DIAGNOSIS — K117 Disturbances of salivary secretion: Secondary | ICD-10-CM | POA: Insufficient documentation

## 2022-02-17 DIAGNOSIS — G4730 Sleep apnea, unspecified: Secondary | ICD-10-CM | POA: Insufficient documentation

## 2022-02-17 DIAGNOSIS — G4733 Obstructive sleep apnea (adult) (pediatric): Secondary | ICD-10-CM | POA: Insufficient documentation

## 2022-02-28 ENCOUNTER — Ambulatory Visit (HOSPITAL_COMMUNITY): Payer: BC Managed Care – PPO | Admitting: Psychiatry

## 2022-03-03 ENCOUNTER — Ambulatory Visit: Payer: BC Managed Care – PPO | Admitting: Neurology

## 2022-03-03 ENCOUNTER — Encounter: Payer: Self-pay | Admitting: Neurology

## 2022-03-03 VITALS — BP 130/75 | HR 72 | Ht 65.0 in | Wt >= 6400 oz

## 2022-03-03 DIAGNOSIS — Z9989 Dependence on other enabling machines and devices: Secondary | ICD-10-CM

## 2022-03-03 DIAGNOSIS — G4733 Obstructive sleep apnea (adult) (pediatric): Secondary | ICD-10-CM | POA: Diagnosis not present

## 2022-03-03 DIAGNOSIS — G4734 Idiopathic sleep related nonobstructive alveolar hypoventilation: Secondary | ICD-10-CM

## 2022-03-03 NOTE — Progress Notes (Signed)
Subjective:    [name] [name] ID: [name] [name] [name] is a 46 y.o. female.  HPI    Interim history:   [name] [name] [name] is a 46 year old right-handed woman with an underlying medical history of allergies, arthritis, anxiety, depression, reflux disease, thyroid disease, vitamin D deficiency, asthma, and morbid obesity with a BMI of over 12, who presents for follow-up consultation of [name] [name] [name] on AutoPap therapy.  [name] [name] [name] is unaccompanied today.  I first met [name] [name] [name] at [name] request of [name] [name] [name] primary care provider on 07/18/2021, at which time [name] [name] [name] reported snoring and excessive daytime somnolence as well as witnessed apneas.  I previous sleep study several years prior that showed mild sleep [name].  [name] [name] [name] was advised to proceed with a sleep study.  [name] [name] [name] had a home sleep test on 08/26/2021, which indicated severe obstructive sleep [name] with an AHI of 87.3/h, O2 nadir 65% with significant time below or at 88% saturation of 263.7 minutes for [name] [name] [name].  [name] [name] [name] had variable snoring.  [name] [name] [name] was advised to proceed with AutoPap therapy.  [name] [name] [name] set up date was 09/06/2021.  [name] [name] [name] has a ResMed air sense 11 AutoSet machine.  Today, 03/03/2022: I reviewed [name] [name] [name] AutoPap compliance data from 02/01/2022 through 03/02/2022, which is a total of 30 days, during which time [name] [name] [name] used [name] [name] [name] machine every [name] with percent use days greater than 4 hours at 100%, indicating superb compliance with an average usage of 9 hours and 46 minutes, residual AHI at goal at 0.9/h, average pressure for [name] 95th percentile at 13.9 cm with a range of 6 to 15 cm with EPR.  Leak on [name] higher side with [name] 95th percentile at 25.8 L/min.  [name] [name] [name] uses nasal pillows with good success.  [name] [name] [name] is very pleased with [name] [name] [name] outcome thus far and [name] [name] [name] is compliant with treatment, is benefiting from it and that [name] [name] [name] feels less sleepy during [name] day, nocturia significantly improved, after initial adjustment [name] [name] [name] has done really well.  Sometimes [name] [name] [name] feels [name] air blowing harder than others.   [name] [name] [name] is able to tolerate [name] interface and [name] pressure thus far.  [name] [name] [name] has been working on weight loss and recently started a trial of Wegovy some 2 weeks ago.  [name] [name] [name] is hoping to be able to continue with it.  [name] [name] [name] tries to hydrate well.  [name] [name] [name] does not always drink enough water by self admission.  [name] [name] [name] sleeps with less restlessness.  Has been compliant since [name] beginning.  [name] [name] [name] compliance data for [name] past 90 days also indicates full compliance.   [name] [name] [name]'s allergies, current medications, family history, past medical history, past social history, past surgical history and problem list were reviewed and updated as appropriate.   Previously:   07/18/21: ([name] [name] [name]) [name] snoring and excessive daytime somnolence as well as witnessed breathing pauses while asleep per husband's feedback.  [name] [name] [name] does not wake up rested.  I reviewed your office note from 02/18/2021.  [name] [name] [name] Epworth sleepiness score is 20/24, fatigue severity score 62 out of 63.  [name] [name] [name] has been sleepy at [name] wheel and has dozed off while driving, never had a car accident but strongly advised not to drive when feeling sleepy.  [name] [name] [name] has an approximately 10-minute commute to [name] [name] [name] work.  [name] [name] [name] had a sleep study several years ago and I was able to review [name] results: Sleep study was performed at [facility] on 01/25/2016, study was interpreted by Dr. Phillips Odor.  Sleep efficiency was 76.6%, REM latency was markedly delayed at 273.5 minutes.  Wake after  sleep onset was 32.5 minutes, sleep latency 56.6 minutes.  Total AHI was 9.2/h, O2 nadir 82%.  [name] [name] [name] REM AHI was 21.6/h. [name] [name] [name] has not been on CPAP therapy. [name] [name] [name] [name] that [name] [name] [name] was told [name] [name] [name] did not have sleep [name].  Weight documented at [name] time of [name] [name] [name] sleep study in April 2017 was 377 pounds, [name] [name] [name] has had an over 100 pound weight gain.  Of note, [name] [name] [name] is on potentially sedating medications but [name] that [name] [name] [name] medications have not affected [name] [name] [name] in [name] past.  [name] [name] [name] is on high-dose Effexor, 225 mg daily, [name] [name] [name] is also on Geodon  40 mg twice daily, Xanax at bedtime, and Inderal as needed.  [name] [name] [name] [name] with [name] [name] [name] husband, [name] [name] [name] has 2 children from [name] [name] [name] first husband.  [name] [name] [name] has an older daughter who is on [name] [name] [name] own, [name] [name] [name] son is 55 and is currently staying more with his dad.  [name] [name] [name] with [name] [name] [name] current husband and [name] [name] [name] mom and is [name] [name] [name] caretaker as mom has advanced lung cancer.  [name] [name] [name] that mom also has sleep [name] and has a CPAP machine.  [name] [name] had a tonsillectomy and adenoidectomy as a child.  Bedtime is generally between 9 and 10 PM and rise time between 7 and 8 AM.  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  [name] [name] [name] is a non-smoker and drinks a lot of caffeine in [name] form of sweet tea, coffee and diet soda.  [name] [name] [name] is unsure how many servings.  Bariatric surgery was discussed with [name] [name] [name] in [name] past as I understand.  [name] [name] [name] [name] that [name] [name] [name] currently is not able to pursue any elective surgery because [name] [name] [name] is [name] [name] [name] mom's caretaker and [name] [name] [name] is not able to make time for surgery for herself.  [name] [name] [name] has nocturia about 2-3 times per average [name] and has woken up occasionally with a headache.  In [name] past few months [name] [name] [name] sleepiness has increased quite significantly.  [name] [name] [name] is followed by psychiatry but has not had any recent medication changes.  [name] [name] [name] Past Medical History Is Significant For: Past Medical History:  Diagnosis Date   Allergy    Anxiety    Arthritis    Asthma    Breast nodule 01/02/2015   Depression    Fatigue    GERD (gastroesophageal reflux disease)    Herpes    HPV in female    Obesity    PMDD (premenstrual dysphoric disorder)    Thyroid disease    hypothryoidism   Trauma    Vaginal Pap smear, abnormal    Vitamin D deficiency     [name] [name] [name] Past Surgical History Is Significant For: Past Surgical History:  Procedure Laterality Date   CESAREAN SECTION     CHOLECYSTECTOMY     TONSILECTOMY/ADENOIDECTOMY WITH MYRINGOTOMY     WISDOM TOOTH EXTRACTION      [name] [name] [name] Family History Is Significant For: Family History  Problem Relation Age of Onset    Hypertension Mother    Arthritis Mother    Anxiety disorder Mother    Lung cancer Mother    Sleep [name] Mother    Asthma Father    COPD Father    Arthritis Father    Chronic bronchitis Father    Hypertension Father    Hyperlipidemia Father    Anxiety disorder Father    Depression Father    Alcohol abuse Father    Drug abuse Brother    Alcohol abuse Brother    Hypertension Brother    Mental illness Brother    Depression Brother    Hyperlipidemia Brother  Hearing loss Brother    Asthma Brother    Learning disabilities Brother    Asthma Maternal Grandmother    Arthritis Maternal Grandmother    Hypertension Maternal Grandmother    Congestive Heart Failure Maternal Grandmother    Diabetes Maternal Grandfather    Stroke Maternal Grandfather    ADD / ADHD Son    Cancer Maternal Aunt    Cancer Maternal Uncle    Sleep [name] Maternal Uncle    Cancer Paternal Aunt    Cancer Paternal Uncle     [name] [name] [name] Social History Is Significant For: Social History   Socioeconomic History   Marital status: Married    Spouse name: Not on file   Number of children: 2   Years of education: Not on file   Highest education level: Associate degree: occupational, Scientist, product/process development, or vocational program  Occupational History   Not on file  Tobacco Use   Smoking status: Former   Smokeless tobacco: Never   Tobacco comments:    Smoked "on occasion around friends" in high school "because it looked cool"  Vaping Use   Vaping Use: Never used  Substance and Sexual Activity   Alcohol use: Yes    Comment: occ   Drug use: No   Sexual activity: Yes    Birth control/protection: Pill    Comment: takes norethindrone acetate for heavy menses  Other Topics Concern   Not on file  Social History Narrative   Social Hx:   Current living situation- living in [place] with husband, son is 50/50 custody with biological father, daughter [name] alone. [name] [name]'s mother [name] with [name] [name] [name].    Raised in [place] by mom  and dad   Siblings- 2 brothers and pt is [name] youngest. [name] [name] [name] is 28 yrs younger than [name] [name] [name] youngest brother   Schooling- associates degree in early childhood   Married- yes - currently in 3rd marriage   Kids- 2 from 1st marriage      Legal issues- denies      Caffeine: all day long everyday, unable to quantify    Social Determinants of Health   Financial Resource Strain: Not on file  Food Insecurity: Not on file  Transportation Needs: Not on file  Physical Activity: Not on file  Stress: Not on file  Social Connections: Not on file    [name] [name] [name] Allergies Are:  Allergies  Allergen Reactions   Latex    Naproxen    Shrimp [Shellfish Allergy] Other (See Comments)    Intolerance   :   [name] [name] [name] Current Medications Are:  Outpatient Encounter Medications as of 03/03/2022  Medication Sig   ALPRAZolam (XANAX) 0.5 MG tablet Take 1 tablet (0.5 mg total) by mouth at bedtime as needed for anxiety or sleep.   augmented betamethasone dipropionate (DIPROLENE-AF) 0.05 % cream    busPIRone (BUSPAR) 10 MG tablet Take 2 tablets (20 mg total) by mouth 2 (two) times daily.   cetirizine (ZYRTEC) 10 MG tablet Take 10 mg by mouth daily.   Cholecalciferol (VITAMIN D3 PO) Take 1 tablet by mouth daily in [name] afternoon.   ibuprofen (ADVIL) 600 MG tablet Take 600 mg by mouth 3 (three) times daily. As needed   levalbuterol (XOPENEX HFA) 45 MCG/ACT inhaler Inhale into [name] lungs every 4 (four) hours as needed for wheezing.   levothyroxine (SYNTHROID) 200 MCG tablet Take 1 tablet (200 mcg total) by mouth daily.   levothyroxine (SYNTHROID) 75 MCG tablet Take 1 tablet (75 mcg total) by mouth daily before breakfast.  MELATONIN PO Take by mouth at bedtime.   metFORMIN (GLUCOPHAGE) 500 MG tablet Take by mouth 2 (two) times daily with a meal.   Misc Natural Products (CRANBERRY/PROBIOTIC PO) Take by mouth daily. Cranberry/Probiotic/Prebiotic Blend-2 daily   montelukast (SINGULAIR) 10 MG tablet Take 10 mg by mouth daily.     Multiple Vitamins-Minerals (WOMENS MULTI VITAMIN & MINERAL PO) Take by mouth daily.   norethindrone (AYGESTIN) 5 MG tablet TAKE 2 TABLETS BY MOUTH DAILY   propranolol (INDERAL) 20 MG tablet Take 1 tablet (20 mg total) by mouth daily as needed (anxiety).   Semaglutide-Weight Management (WEGOVY [place]) Inject into [name] skin. Taking once weekly.   venlafaxine XR (EFFEXOR-XR) 75 MG 24 hr capsule Take 3 capsules (225 mg total) by mouth daily with breakfast.   ziprasidone (GEODON) 60 MG capsule Take 1 capsule (60 mg total) by mouth 2 (two) times daily with a meal.   No facility-administered encounter medications on file as of 03/03/2022.  :  Review of Systems:  Out of a complete 14 point review of systems, all are reviewed and negative with [name] exception of these symptoms as listed below:  Review of Systems  Neurological:        Doing ok.  Set 08-2021. DME [employer].     Objective:  Neurological Exam  Physical Exam Physical Examination:   Vitals:   03/03/22 1253  BP: 130/75  Pulse: 72    General Examination: [name] [name] [name] is a very pleasant 46 y.o. female in no acute distress. [name] [name] [name] appears well-developed and well-nourished and well groomed.   HEENT: Normocephalic, atraumatic, pupils are equal, round and reactive to light, extraocular tracking is good without limitation to gaze excursion or nystagmus noted. Hearing is grossly intact. Face is symmetric with normal facial animation. Speech is clear with no dysarthria noted. There is no hypophonia. There is no lip, neck/head, jaw or voice tremor. Neck with FROM, no carotid bruits on auscultation. Oropharynx exam reveals: mild mouth dryness, good dental hygiene and moderate airway crowding.  Tongue protrudes centrally and palate elevates symmetrically.   Chest: Clear to auscultation without wheezing, rhonchi or crackles noted.   Heart: S1+S2+0, regular and normal without murmurs, rubs or gallops noted.    Abdomen: Soft, non-tender and  non-distended.   Extremities: There is no pitting edema in [name] distal lower extremities bilaterally.    Skin: Warm and dry with mild erythema noted in [name] right more than left forearm, [name] [name] [name] [name] a history of eczema.    Musculoskeletal: exam reveals no obvious joint deformities.    Neurologically:  Mental status: [name] [name] [name] is awake, alert and oriented in all 4 spheres. [name] [name] [name] immediate and remote memory, attention, language skills and fund of knowledge are appropriate. There is no evidence of aphasia, agnosia, apraxia or anomia. Speech is clear with normal prosody and enunciation. Thought process is linear. Mood is normal and affect is normal.  Cranial nerves II - XII are as described above under HEENT exam.  Motor exam: Normal bulk, strength and tone is noted. There is no tremor, Romberg is not tested due to safety concerns, fine motor skills grossly intact. [name] [name] [name] has a four-point cane.   Assessment and Plan:  In summary, [name] [name] [name] is a very pleasant 46 year old female with an underlying medical history of allergies, arthritis, anxiety, depression, reflux disease, thyroid disease, vitamin D deficiency, asthma, and morbid obesity with a BMI of over 80, who presents for follow-up consultation of [name] [name] [name], which was deemed  in [name] severe range by home sleep testing on 08/26/2021.  Overall AHI was 87.3/h, O2 nadir 65% with time below or at 88% saturation of over 4 hours for [name] [name] [name].  [name] [name] [name] is on AutoPap therapy, [name] [name] [name] has been compliant with treatment and [name] very good results, [name] [name] [name] is highly commended for treatment adherence and is advised to consistently keep using [name] [name] [name] AutoPap, I do believe [name] [name] [name] current pressure settings are adequate.  Nevertheless, given [name] severe desaturation and nocturnal hypoxemia would like to proceed with an overnight pulse oximetry test while [name] [name] [name] is on AutoPap therapy.  We will do a test overnight through equipment from [name] [name] [name] current DME company, [employer].  I placed an order for this.  If all goes well, we will maintain treatment at [name] current settings, we will call [name] [name] [name] with [name] results and plan to follow-up routinely in sleep clinic for [name] [name] [name] to see one of our nurse practitioners in 1 year.  If [name] [name] [name] oxygen saturations are not adequate, we may have to bring [name] [name] [name] in for formal titration study.  I explained this to [name] [name] [name] in detail today, we talked about [name] [name] [name] test results and reviewed [name] [name] [name] compliance data as well.  We talked about [name] importance of treating severe sleep [name] and [name] importance of weight management.  [name] [name] [name] is working on weight loss.  I answered all [name] [name] [name] questions today and [name] [name] [name] was in agreement with our plan.  I spent 35 minutes in total face-to-face time and in reviewing records during pre-charting, more than 50% of which was spent in counseling and coordination of care, reviewing test results, reviewing medications and treatment regimen and/or in discussing or reviewing [name] diagnosis of OSA, [name] prognosis and treatment options. Pertinent laboratory and imaging test results that were available during this visit with [name] [name] [name] were reviewed by me and considered in my medical decision making (see chart for details).

## 2022-03-03 NOTE — Patient Instructions (Addendum)
It was nice to see you again today. I am glad you are doing well with your autoPAP. You are fully compliant with treatment, keep up the good work! Please continue using your autoPAP regularly. While your insurance requires that you use PAP at least 4 hours each night on 70% of the nights, I recommend, that you not skip any nights and use it throughout the night if you can. Getting used to PAP and staying with the treatment long term does take time and patience and discipline. Untreated obstructive sleep apnea when it is moderate to severe can have an adverse impact on cardiovascular health and raise her risk for heart disease, arrhythmias, hypertension, congestive heart failure, stroke and diabetes. Untreated obstructive sleep apnea causes sleep disruption, nonrestorative sleep, and sleep deprivation. This can have an impact on your day to day functioning and cause daytime sleepiness and impairment of cognitive function, memory loss, mood disturbance, and problems focussing. Using PAP regularly can improve these symptoms.  We can see you in 1 year, you can see one of our nurse practitioners.  As discussed, we will do an overnight oxygen level test, called ONO, and your DME company will call and set this up for one night, while you also use your autoPAP as usual. We will call you with the results. This is to make sure that your oxygen levels stay in the 90s, while you are treated with autoPAP for your OSA. Remember, your oxygen levels dropped into the 60s during the home sleep test.

## 2022-03-04 NOTE — Progress Notes (Signed)
Order faxed to [facility] [phone] w/fax confirmation.

## 2022-03-05 ENCOUNTER — Ambulatory Visit: Payer: BC Managed Care – PPO | Admitting: "Endocrinology

## 2022-03-14 ENCOUNTER — Ambulatory Visit (HOSPITAL_COMMUNITY): Payer: BC Managed Care – PPO | Admitting: Psychiatry

## 2022-03-19 ENCOUNTER — Telehealth: Payer: Self-pay | Admitting: *Deleted

## 2022-03-19 NOTE — Telephone Encounter (Signed)
Received ONO on CPAP results.  In in box for review.

## 2022-03-20 ENCOUNTER — Telehealth (HOSPITAL_BASED_OUTPATIENT_CLINIC_OR_DEPARTMENT_OTHER): Payer: BC Managed Care – PPO | Admitting: Psychiatry

## 2022-03-20 ENCOUNTER — Telehealth (HOSPITAL_COMMUNITY): Payer: BC Managed Care – PPO | Admitting: Psychiatry

## 2022-03-20 ENCOUNTER — Encounter (HOSPITAL_COMMUNITY): Payer: Self-pay | Admitting: Psychiatry

## 2022-03-20 DIAGNOSIS — R454 Irritability and anger: Secondary | ICD-10-CM | POA: Diagnosis not present

## 2022-03-20 DIAGNOSIS — F411 Generalized anxiety disorder: Secondary | ICD-10-CM | POA: Diagnosis not present

## 2022-03-20 DIAGNOSIS — F331 Major depressive disorder, recurrent, moderate: Secondary | ICD-10-CM

## 2022-03-20 MED ORDER — BUSPIRONE HCL 10 MG PO TABS: 20.0000 mg | ORAL_TABLET | Freq: Two times a day (BID) | ORAL | 0 refills | Status: DC

## 2022-03-20 MED ORDER — ZIPRASIDONE HCL 60 MG PO CAPS: 60.0000 mg | ORAL_CAPSULE | Freq: Two times a day (BID) | ORAL | 0 refills | Status: DC

## 2022-03-20 MED ORDER — ALPRAZOLAM 0.5 MG PO TABS: 0.5000 mg | ORAL_TABLET | Freq: Every evening | ORAL | 2 refills | Status: DC | PRN

## 2022-03-20 MED ORDER — VENLAFAXINE HCL ER 75 MG PO CP24: 225.0000 mg | ORAL_CAPSULE | Freq: Every day | ORAL | 0 refills | Status: DC

## 2022-03-20 MED ORDER — PROPRANOLOL HCL 20 MG PO TABS: 20.0000 mg | ORAL_TABLET | Freq: Every day | ORAL | 0 refills | Status: DC | PRN

## 2022-03-20 NOTE — Telephone Encounter (Signed)
Spoke with patient and discussed her ONO results as noted below by Dr. Frances Furbish.  Patient verbalized understanding and appreciation.  She understands to continue with PAP therapy with full compliance and follow-up May 2024 as scheduled.  Patient was encouraged to call in the meantime with any questions.

## 2022-03-20 NOTE — Telephone Encounter (Signed)
I received patient's pulse oximetry report from 03/17/2022. ONO on PAP therapy was done with a total recording time of 8 hours and 49 minutes, excluded sampling time of 1 hour and 30 minutes, ie total valid test time of 7 hours and 19 minutes.  Overall oxygen saturation was 93.8%, nadir was 88%, time below or at 88% saturation was 4 seconds for the night.  Please advise patient that her oxygen saturations were good on PAP therapy, please advise her to continue with PAP therapy with full compliance and follow-up as scheduled.

## 2022-03-20 NOTE — Progress Notes (Signed)
Virtual Visit via Video Note  I connected with [name] on 03/20/22 at  8:30 AM EDT by a video enabled telemedicine application and verified that I am speaking with the correct person using two identifiers.  Location: Patient: at work Provider: office   I discussed the limitations of evaluation and management by telemedicine and the availability of in person appointments. The patient expressed understanding and agreed to proceed.  History of Present Illness: [name] shares that she is "doing ok". Her moods have been stable. She has some irritablity mostly at work. [name] has on/off mild depression comes in waves. It is manageable and no where near as bad as before.. She visits her granddaughter once/week and friends at least 1x/month. Her anhedonia is slowly improving. She is sleeping 8-10 hrs/night with her CPAP. She is still tired a lot but her appetite is good. Her negative self has decreased and is not as intense. Her concentration is ok at times but mostly poor. She denies hopelessness and SI/HI. Her anxiety is worse than her depression. It is mostly centered on finances. She deals with it by letting the thought pass or will talk to someone. It helps most of the time. She is in counseling but the frequency has decreased due to work schedule.    Observations/Objective: Psychiatric Specialty Exam: ROS  There were no vitals taken for this visit.There is no height or weight on file to calculate BMI.  General Appearance: Fairly Groomed and Neat  Eye Contact:  Fair  Speech:  Clear and Coherent and Normal Rate  Volume:  Normal  Mood:  Anxious and Depressed  Affect:  Blunt  Thought Process:  Goal Directed, Linear, and Descriptions of Associations: Intact  Orientation:  Full (Time, Place, and Person)  Thought Content:  Logical  Suicidal Thoughts:  No  Homicidal Thoughts:  No  Memory:  Immediate;   Good  Judgement:  Good  Insight:  Good  Psychomotor Activity:  Normal  Concentration:   Concentration: Good  Recall:  Good  Fund of Knowledge:  Good  Language:  Good  Akathisia:  No  Handed:  Right  AIMS (if indicated):     Assets:  Communication Skills Desire for Improvement Financial Resources/Insurance Housing Resilience Talents/Skills Transportation Vocational/Educational  ADL's:  Intact  Cognition:  WNL  Sleep:        Assessment and Plan:     03/20/2022    8:39 AM 02/11/2022   11:19 AM 01/28/2022   11:16 AM 01/14/2022   11:03 AM 12/26/2021    8:46 AM  Depression screen PHQ 2/9  Decreased Interest 0 1 1 2 1   Down, Depressed, Hopeless 1 1 1 1 3   PHQ - 2 Score 1 2 2 3 4   Altered sleeping 0 1 1 1  0  Tired, decreased energy 3 3 3 3 3   Change in appetite 0 1 1 1  0  Feeling bad or failure about yourself  1 1 1 1 1   Trouble concentrating 3 1 0 1 1  Moving slowly or fidgety/restless 0 0 0 0 0  Suicidal thoughts 0 0 1 1 1   PHQ-9 Score 8 9 9 11 10   Difficult doing work/chores     Very difficult    Flowsheet Row Video Visit from 03/20/2022 in [facility] Video Visit from 12/26/2021 in [facility] Video Visit from 10/31/2021 in [facility]  C-SSRS RISK CATEGORY No Risk Error: Q3, 4, or 5 should not  be populated when Q2 is No Error: Q3, 4, or 5 should not be populated when Q2 is No        The risk of un-intended pregnancy is low based on the fact that pt reports she is taking OCP. Pt is aware that these meds carry a teratogenic risk. Pt will discuss plan of action if she does or plans to become pregnant in the future.  Status of current problems: decreased depression and anxiety  Meds: continue current meds as pt reports benefit with reduction in mood and anxiety symptoms. She is tolerating meds and denies SE.   1. GAD (generalized anxiety disorder) - ALPRAZolam (XANAX) 0.5 MG tablet; Take 1 tablet (0.5 mg total) by mouth at bedtime as needed for  anxiety or sleep.  Dispense: 30 tablet; Refill: 2 - venlafaxine XR (EFFEXOR-XR) 75 MG 24 hr capsule; Take 3 capsules (225 mg total) by mouth daily with breakfast.  Dispense: 270 capsule; Refill: 0 - ziprasidone (GEODON) 60 MG capsule; Take 1 capsule (60 mg total) by mouth 2 (two) times daily with a meal.  Dispense: 180 capsule; Refill: 0 - propranolol (INDERAL) 20 MG tablet; Take 1 tablet (20 mg total) by mouth daily as needed (anxiety).  Dispense: 90 tablet; Refill: 0 - busPIRone (BUSPAR) 10 MG tablet; Take 2 tablets (20 mg total) by mouth 2 (two) times daily.  Dispense: 360 tablet; Refill: 0  2. MDD (major depressive disorder), recurrent episode, moderate (HCC) - venlafaxine XR (EFFEXOR-XR) 75 MG 24 hr capsule; Take 3 capsules (225 mg total) by mouth daily with breakfast.  Dispense: 270 capsule; Refill: 0 - ziprasidone (GEODON) 60 MG capsule; Take 1 capsule (60 mg total) by mouth 2 (two) times daily with a meal.  Dispense: 180 capsule; Refill: 0 - busPIRone (BUSPAR) 10 MG tablet; Take 2 tablets (20 mg total) by mouth 2 (two) times daily.  Dispense: 360 tablet; Refill: 0  3. Irritability and anger - ziprasidone (GEODON) 60 MG capsule; Take 1 capsule (60 mg total) by mouth 2 (two) times daily with a meal.  Dispense: 180 capsule; Refill: 0       Therapy: brief supportive therapy provided.   Collaboration of Care: Other none today  Patient/Guardian was advised Release of Information must be obtained prior to any record release in order to collaborate their care with an outside provider. Patient/Guardian was advised if they have not already done so to contact the registration department to sign all necessary forms in order for [place] to release information regarding their care.   Consent: Patient/Guardian gives verbal consent for treatment and assignment of benefits for services provided during this visit. Patient/Guardian expressed understanding and agreed to proceed.   Follow Up  Instructions: Follow up in 2-3 months or sooner if needed    I discussed the assessment and treatment plan with the patient. The patient was provided an opportunity to ask questions and all were answered. The patient agreed with the plan and demonstrated an understanding of the instructions.   The patient was advised to call back or seek an in-person evaluation if the symptoms worsen or if the condition fails to improve as anticipated.  I provided 13 minutes of non-face-to-face time during this encounter.   [name], MD

## 2022-03-28 ENCOUNTER — Ambulatory Visit (HOSPITAL_COMMUNITY): Payer: BC Managed Care – PPO | Admitting: Psychiatry

## 2022-04-01 ENCOUNTER — Ambulatory Visit: Payer: BC Managed Care – PPO | Admitting: Neurology

## 2022-04-07 ENCOUNTER — Ambulatory Visit (INDEPENDENT_AMBULATORY_CARE_PROVIDER_SITE_OTHER): Payer: BC Managed Care – PPO | Admitting: Psychiatry

## 2022-04-07 DIAGNOSIS — F411 Generalized anxiety disorder: Secondary | ICD-10-CM

## 2022-04-07 DIAGNOSIS — F331 Major depressive disorder, recurrent, moderate: Secondary | ICD-10-CM
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
## 2022-04-11 ENCOUNTER — Ambulatory Visit (HOSPITAL_COMMUNITY): Payer: BC Managed Care – PPO | Admitting: Psychiatry

## 2022-04-21 ENCOUNTER — Ambulatory Visit (INDEPENDENT_AMBULATORY_CARE_PROVIDER_SITE_OTHER): Payer: BC Managed Care – PPO | Admitting: Psychiatry

## 2022-04-21 DIAGNOSIS — F411 Generalized anxiety disorder: Secondary | ICD-10-CM
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
## 2022-05-05 ENCOUNTER — Ambulatory Visit (HOSPITAL_COMMUNITY): Payer: BC Managed Care – PPO | Admitting: Psychiatry

## 2022-05-26 ENCOUNTER — Ambulatory Visit (INDEPENDENT_AMBULATORY_CARE_PROVIDER_SITE_OTHER): Payer: BC Managed Care – PPO | Admitting: Psychiatry

## 2022-05-26 DIAGNOSIS — F411 Generalized anxiety disorder: Secondary | ICD-10-CM | POA: Diagnosis not present

## 2022-05-26 DIAGNOSIS — F331 Major depressive disorder, recurrent, moderate: Secondary | ICD-10-CM | POA: Diagnosis not present
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
## 2022-06-08 ENCOUNTER — Other Ambulatory Visit: Payer: Self-pay | Admitting: "Endocrinology

## 2022-06-09 ENCOUNTER — Ambulatory Visit (INDEPENDENT_AMBULATORY_CARE_PROVIDER_SITE_OTHER): Payer: BC Managed Care – PPO | Admitting: Psychiatry

## 2022-06-09 DIAGNOSIS — F331 Major depressive disorder, recurrent, moderate: Secondary | ICD-10-CM

## 2022-06-09 DIAGNOSIS — F411 Generalized anxiety disorder: Secondary | ICD-10-CM
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
## 2022-06-12 ENCOUNTER — Telehealth (HOSPITAL_BASED_OUTPATIENT_CLINIC_OR_DEPARTMENT_OTHER): Payer: BC Managed Care – PPO | Admitting: Psychiatry

## 2022-06-12 DIAGNOSIS — Z79899 Other long term (current) drug therapy: Secondary | ICD-10-CM

## 2022-06-12 DIAGNOSIS — R454 Irritability and anger: Secondary | ICD-10-CM

## 2022-06-12 DIAGNOSIS — F331 Major depressive disorder, recurrent, moderate: Secondary | ICD-10-CM

## 2022-06-12 DIAGNOSIS — F411 Generalized anxiety disorder: Secondary | ICD-10-CM

## 2022-06-12 MED ORDER — VENLAFAXINE HCL ER 75 MG PO CP24: 225.0000 mg | ORAL_CAPSULE | Freq: Every day | ORAL | 0 refills | Status: DC

## 2022-06-12 MED ORDER — PROPRANOLOL HCL 20 MG PO TABS: 20.0000 mg | ORAL_TABLET | Freq: Every day | ORAL | 0 refills | Status: DC | PRN

## 2022-06-12 MED ORDER — BUSPIRONE HCL 10 MG PO TABS: 20.0000 mg | ORAL_TABLET | Freq: Two times a day (BID) | ORAL | 0 refills | Status: DC

## 2022-06-12 MED ORDER — ALPRAZOLAM 0.5 MG PO TABS: 0.5000 mg | ORAL_TABLET | Freq: Every evening | ORAL | 2 refills | Status: DC | PRN

## 2022-06-12 MED ORDER — ZIPRASIDONE HCL 60 MG PO CAPS: 60.0000 mg | ORAL_CAPSULE | Freq: Two times a day (BID) | ORAL | 0 refills | Status: DC

## 2022-06-12 NOTE — Progress Notes (Signed)
Virtual Visit via Video Note  I connected with [name] on 06/12/22 at  8:45 AM EDT by a video enabled telemedicine application and verified that I am speaking with the correct person using two identifiers.  Location: Patient: work Provider: office   I discussed the limitations of evaluation and management by telemedicine and the availability of in person appointments. The patient expressed understanding and agreed to proceed.  History of Present Illness: "Everything is going well". [name] is working hard in therapy and has learned some coping skills to deal with stress at work. Her anxiety is mostly centered on work on a daily basis but overall it is not as severe. She is working on healthy boundaries and how she feels at work. Her irritability has decreased. The main culprit of her anxiety has left her work. She still gets angry and irritability at times but she uses coping skills to calm down and it helps. [name] shares she feels depressed a few days/week. She gets down but doesn't stay that way. It lasts a few hours and passes after using coping skills. She denies anhedonia. She denies SI/HI. Most nights she is sleeping about 9 hrs/night. Her appetite is good. Her focus varies and sometimes she gets overwhelmed and can't concentrate. Overall focus is not too bad.    Observations/Objective: Psychiatric Specialty Exam: ROS  There were no vitals taken for this visit.There is no height or weight on file to calculate BMI.  General Appearance: Fairly Groomed and Neat  Eye Contact:  Good  Speech:  Clear and Coherent and Normal Rate  Volume:  Normal  Mood:  Anxious  Affect:  Full Range  Thought Process:  Goal Directed, Linear, and Descriptions of Associations: Intact  Orientation:  Full (Time, Place, and Person)  Thought Content:  Logical  Suicidal Thoughts:  No  Homicidal Thoughts:  No  Memory:  Immediate;   Good  Judgement:  Good  Insight:  Good  Psychomotor Activity:  Normal   Concentration:  Concentration: Good  Recall:  Good  Fund of Knowledge:  Good  Language:  Good  Akathisia:  No  Handed:  Right  AIMS (if indicated):     Assets:  Communication Skills Desire for Improvement Financial Resources/Insurance Housing Resilience Social Support Talents/Skills Transportation Vocational/Educational  ADL's:  Intact  Cognition:  WNL  Sleep:        Assessment and Plan:     06/12/2022    8:50 AM 03/20/2022    8:39 AM 02/11/2022   11:19 AM 01/28/2022   11:16 AM 01/14/2022   11:03 AM  Depression screen PHQ 2/9  Decreased Interest 0 0 1 1 2   Down, Depressed, Hopeless 1 1 1 1 1   PHQ - 2 Score 1 1 2 2 3   Altered sleeping  0 1 1 1   Tired, decreased energy  3 3 3 3   Change in appetite  0 1 1 1   Feeling bad or failure about yourself   1 1 1 1   Trouble concentrating  3 1 0 1  Moving slowly or fidgety/restless  0 0 0 0  Suicidal thoughts  0 0 1 1  PHQ-9 Score  8 9 9 11     Flowsheet Row [facility]/12[facility] Video Visit from 03/20/2022 in [facility] Video Visit from 12/26/2021 in [facility]  C-SSRS RISK CATEGORY No Risk No Risk Error: Q3, 4, or 5 should not be populated when  Q2 is No        Pt is aware that these meds carry a teratogenic risk. Pt will discuss plan of action if she does or plans to become pregnant in the future.  Status of current problems: stable  Meds:  1. GAD (generalized anxiety disorder) - ALPRAZolam (XANAX) 0.5 MG tablet; Take 1 tablet (0.5 mg total) by mouth at bedtime as needed for anxiety or sleep.  Dispense: 30 tablet; Refill: 2 - busPIRone (BUSPAR) 10 MG tablet; Take 2 tablets (20 mg total) by mouth 2 (two) times daily.  Dispense: 360 tablet; Refill: 0 - propranolol (INDERAL) 20 MG tablet; Take 1 tablet (20 mg total) by mouth daily as needed (anxiety).  Dispense: 90 tablet; Refill: 0 -  venlafaxine XR (EFFEXOR-XR) 75 MG 24 hr capsule; Take 3 capsules (225 mg total) by mouth daily with breakfast.  Dispense: 270 capsule; Refill: 0 - ziprasidone (GEODON) 60 MG capsule; Take 1 capsule (60 mg total) by mouth 2 (two) times daily with a meal.  Dispense: 180 capsule; Refill: 0  2. MDD (major depressive disorder), recurrent episode, moderate (HCC) - busPIRone (BUSPAR) 10 MG tablet; Take 2 tablets (20 mg total) by mouth 2 (two) times daily.  Dispense: 360 tablet; Refill: 0 - venlafaxine XR (EFFEXOR-XR) 75 MG 24 hr capsule; Take 3 capsules (225 mg total) by mouth daily with breakfast.  Dispense: 270 capsule; Refill: 0 - ziprasidone (GEODON) 60 MG capsule; Take 1 capsule (60 mg total) by mouth 2 (two) times daily with a meal.  Dispense: 180 capsule; Refill: 0  3. Irritability and anger - ziprasidone (GEODON) 60 MG capsule; Take 1 capsule (60 mg total) by mouth 2 (two) times daily with a meal.  Dispense: 180 capsule; Refill: 0  4. Long term current use of antipsychotic medication - CBC - TSH - Lipid panel - Comprehensive metabolic panel - Hemoglobin A1c - Prolactin        EKG- nurse will set up appointment for EKG since her PCP does not do them     Therapy: brief supportive therapy provided.     Collaboration of Care: Referral or follow-up with counselor/therapist AEB therapist  Patient/Guardian was advised Release of Information must be obtained prior to any record release in order to collaborate their care with an outside provider. Patient/Guardian was advised if they have not already done so to contact the registration department to sign all necessary forms in order for [place] to release information regarding their care.   Consent: Patient/Guardian gives verbal consent for treatment and assignment of benefits for services provided during this visit. Patient/Guardian expressed understanding and agreed to proceed.     Follow Up Instructions: Follow up in 2-3 months or sooner  if needed    I discussed the assessment and treatment plan with the patient. The patient was provided an opportunity to ask questions and all were answered. The patient agreed with the plan and demonstrated an understanding of the instructions.   The patient was advised to call back or seek an in-person evaluation if the symptoms worsen or if the condition fails to improve as anticipated.  I provided 11 minutes of non-face-to-face time during this encounter.   [name], MD

## 2022-06-23 ENCOUNTER — Ambulatory Visit (HOSPITAL_COMMUNITY): Payer: BC Managed Care – PPO | Admitting: Psychiatry

## 2022-07-15 ENCOUNTER — Ambulatory Visit (HOSPITAL_COMMUNITY): Payer: BC Managed Care – PPO | Admitting: Psychiatry

## 2022-07-15 DIAGNOSIS — J302 Other seasonal allergic rhinitis: Secondary | ICD-10-CM | POA: Insufficient documentation

## 2022-07-15 DIAGNOSIS — Z6841 Body Mass Index (BMI) 40.0 and over, adult: Secondary | ICD-10-CM | POA: Insufficient documentation

## 2022-07-15 DIAGNOSIS — R6889 Other general symptoms and signs: Secondary | ICD-10-CM | POA: Insufficient documentation

## 2022-07-15 DIAGNOSIS — Z1231 Encounter for screening mammogram for malignant neoplasm of breast: Secondary | ICD-10-CM | POA: Insufficient documentation

## 2022-07-15 DIAGNOSIS — K5900 Constipation, unspecified: Secondary | ICD-10-CM | POA: Insufficient documentation

## 2022-07-15 DIAGNOSIS — E785 Hyperlipidemia, unspecified: Secondary | ICD-10-CM | POA: Insufficient documentation

## 2022-07-29 ENCOUNTER — Ambulatory Visit (INDEPENDENT_AMBULATORY_CARE_PROVIDER_SITE_OTHER): Payer: BC Managed Care – PPO | Admitting: Psychiatry

## 2022-07-29 DIAGNOSIS — F411 Generalized anxiety disorder: Secondary | ICD-10-CM

## 2022-07-29 NOTE — Plan of Care (Signed)
  Problem: Depression CCP Problem  depressed mood, negative thoughts about self  Goal: I want to help combating negative thoughts such as not being good enough AEB decreasing these thoughts from daily to 3 or less times per week consistently for 3 consecutive weeks  Outcome: Progressing Goal: LTG: [name] WILL SCORE LESS THAN 10 ON THE PATIENT HEALTH QUESTIONNAIRE (PHQ-9) Outcome: Progressing Goal: STG: [name] WILL IDENTIFY 3 COGNITIVE PATTERNS AND BELIEFS THAT SUPPORT DEPRESSION Description: [name] will identify 3 cognitive patterns and beliefs that support depression and anxiety.  Outcome: Progressing
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
## 2022-08-12 ENCOUNTER — Ambulatory Visit (INDEPENDENT_AMBULATORY_CARE_PROVIDER_SITE_OTHER): Payer: BC Managed Care – PPO | Admitting: Psychiatry

## 2022-08-12 DIAGNOSIS — F331 Major depressive disorder, recurrent, moderate: Secondary | ICD-10-CM

## 2022-08-12 DIAGNOSIS — F411 Generalized anxiety disorder: Secondary | ICD-10-CM
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
## 2022-08-20 ENCOUNTER — Ambulatory Visit (HOSPITAL_COMMUNITY): Payer: BC Managed Care – PPO | Admitting: Psychiatry

## 2022-08-20 DIAGNOSIS — Z724 Inappropriate diet and eating habits: Secondary | ICD-10-CM | POA: Insufficient documentation

## 2022-08-20 DIAGNOSIS — J4520 Mild intermittent asthma, uncomplicated: Secondary | ICD-10-CM | POA: Insufficient documentation

## 2022-09-03 ENCOUNTER — Ambulatory Visit (HOSPITAL_COMMUNITY): Payer: BC Managed Care – PPO | Admitting: Psychiatry

## 2022-09-03 ENCOUNTER — Encounter (HOSPITAL_COMMUNITY): Payer: Self-pay

## 2022-09-11 ENCOUNTER — Telehealth (HOSPITAL_BASED_OUTPATIENT_CLINIC_OR_DEPARTMENT_OTHER): Payer: BC Managed Care – PPO | Admitting: Psychiatry

## 2022-09-11 ENCOUNTER — Other Ambulatory Visit (HOSPITAL_COMMUNITY): Payer: Self-pay | Admitting: *Deleted

## 2022-09-11 DIAGNOSIS — F411 Generalized anxiety disorder: Secondary | ICD-10-CM | POA: Diagnosis not present

## 2022-09-11 DIAGNOSIS — Z79899 Other long term (current) drug therapy: Secondary | ICD-10-CM

## 2022-09-11 DIAGNOSIS — F331 Major depressive disorder, recurrent, moderate: Secondary | ICD-10-CM | POA: Diagnosis not present

## 2022-09-11 DIAGNOSIS — R454 Irritability and anger: Secondary | ICD-10-CM

## 2022-09-11 MED ORDER — PROPRANOLOL HCL 20 MG PO TABS: 20.0000 mg | ORAL_TABLET | Freq: Every day | ORAL | 0 refills | Status: DC | PRN

## 2022-09-11 MED ORDER — ZIPRASIDONE HCL 60 MG PO CAPS: 60.0000 mg | ORAL_CAPSULE | Freq: Two times a day (BID) | ORAL | 0 refills | Status: DC

## 2022-09-11 MED ORDER — ALPRAZOLAM 0.5 MG PO TABS: 0.5000 mg | ORAL_TABLET | Freq: Every evening | ORAL | 2 refills | Status: DC | PRN

## 2022-09-11 MED ORDER — VENLAFAXINE HCL ER 75 MG PO CP24: 225.0000 mg | ORAL_CAPSULE | Freq: Every day | ORAL | 0 refills | Status: DC

## 2022-09-11 MED ORDER — BUSPIRONE HCL 10 MG PO TABS: 20.0000 mg | ORAL_TABLET | Freq: Two times a day (BID) | ORAL | 0 refills | Status: DC

## 2022-09-11 NOTE — Progress Notes (Signed)
Virtual Visit via Video Note  I connected with [name] on 09/11/22 at  8:30 AM EST by  a video enabled telemedicine application and verified that I am speaking with the correct person using two identifiers.  Location: Patient: at work Provider: office   I discussed the limitations of evaluation and management by telemedicine and the availability of in person appointments. The patient expressed understanding and agreed to proceed.  History of Present Illness: "I have been doing pretty good". [name] states her depression is slowly improving. She has only felt depressed a couple of times a month. She will get upset and down but it is manageable and not as severe as it used to be. [name] denies anhedonia, isolation and hopelessness. She denies SI/HI. Work remain stressful. Her anxiety is mostly centered on work and finances. It is a daily struggles that comes and goes. [name] has racing thoughts and ruminates. She rarely feels sick to her stomach when overly anxious. She is able to let the thoughts go easier. She notices it at night when lying down but Xanax helps to calm her. Her irritability and anger have mildly improved. She notices it is not as severe as before. It is still something she struggles with daily but she is able to control her response better. Her sleep is good with her CPAP machine. She has a new roommate as of a few days ago which has relived some of her financial stress. [name] feels her meds are working well and does not want them changed.    Observations/Objective: Psychiatric Specialty Exam: ROS  There were no vitals taken for this visit.There is no height or weight on file to calculate BMI.  General Appearance: Neat and Well Groomed  Eye Contact:  Good  Speech:  Clear and Coherent and Normal Rate  Volume:  Normal  Mood:  Anxious  Affect:  Full Range  Thought Process:  Goal Directed, Linear, and Descriptions of Associations: Intact  Orientation:  Full (Time, Place,  and Person)  Thought Content:  Logical  Suicidal Thoughts:  No  Homicidal Thoughts:  No  Memory:  Immediate;   Good  Judgement:  Good  Insight:  Good  Psychomotor Activity:  Normal  Concentration:  Concentration: Good  Recall:  Good  Fund of Knowledge:  Good  Language:  Good  Akathisia:  No  Handed:  Right  AIMS (if indicated):     Assets:  Communication Skills Desire for Improvement Financial Resources/Insurance Housing Resilience Social Support Talents/Skills Transportation Vocational/Educational  ADL's:  Intact  Cognition:  WNL  Sleep:        Assessment and Plan:     09/11/2022    8:56 AM 06/12/2022    8:50 AM 03/20/2022    8:39 AM 02/11/2022   11:19 AM 01/28/2022   11:16 AM  Depression screen PHQ 2/9  Decreased Interest 0 0 0 1 1  Down, Depressed, Hopeless 1 1 1 1 1   PHQ - 2 Score 1 1 1 2 2   Altered sleeping   0 1 1  Tired, decreased energy   3 3 3   Change in appetite   0 1 1  Feeling bad or failure about yourself    1 1 1   Trouble concentrating   3 1 0  Moving slowly or fidgety/restless   0 0 0  Suicidal thoughts   0 0 1  PHQ-9 Score   8 9 9     Flowsheet Row [facility] Video Visit from 06/12/2022 in [facility] Video Visit from 03/20/2022 in [facility]  C-SSRS RISK CATEGORY No Risk No Risk No Risk         Pt is aware that these meds carry a teratogenic risk. Pt will discuss plan of action if she does or plans to become pregnant in the future.  Status of current problems: stable  Meds:  1. GAD (generalized anxiety disorder) - ALPRAZolam (XANAX) 0.5 MG tablet; Take 1 tablet (0.5 mg total) by mouth at bedtime as needed for anxiety or sleep.  Dispense: 30 tablet; Refill: 2 - busPIRone (BUSPAR) 10 MG tablet; Take 2 tablets (20 mg total) by mouth 2 (two) times daily.  Dispense: 360 tablet; Refill: 0 -  venlafaxine XR (EFFEXOR-XR) 75 MG 24 hr capsule; Take 3 capsules (225 mg total) by mouth daily with breakfast.  Dispense: 270 capsule; Refill: 0 - propranolol (INDERAL) 20 MG tablet; Take 1 tablet (20 mg total) by mouth daily as needed (anxiety).  Dispense: 90 tablet; Refill: 0 - ziprasidone (GEODON) 60 MG capsule; Take 1 capsule (60 mg total) by mouth 2 (two) times daily with a meal.  Dispense: 180 capsule; Refill: 0  2. MDD (major depressive disorder), recurrent episode, moderate (HCC) - busPIRone (BUSPAR) 10 MG tablet; Take 2 tablets (20 mg total) by mouth 2 (two) times daily.  Dispense: 360 tablet; Refill: 0 - venlafaxine XR (EFFEXOR-XR) 75 MG 24 hr capsule; Take 3 capsules (225 mg total) by mouth daily with breakfast.  Dispense: 270 capsule; Refill: 0 - ziprasidone (GEODON) 60 MG capsule; Take 1 capsule (60 mg total) by mouth 2 (two) times daily with a meal.  Dispense: 180 capsule; Refill: 0  3. Irritability and anger - ziprasidone (GEODON) 60 MG capsule; Take 1 capsule (60 mg total) by mouth 2 (two) times daily with a meal.  Dispense: 180 capsule; Refill: 0     Labs: rstates she had labs done a few weeks ago at [facility] but I do not see them in her chart Asked my nurse to schedule an EKG    Therapy: brief supportive therapy provided. Discussed psychosocial stressors in detail.   Collaboration of Care: Other none   Patient/Guardian was advised Release of Information must be obtained prior to any record release in order to collaborate their care with an outside provider. Patient/Guardian was advised if they have not already done so to contact the registration department to sign all necessary forms in order for [place] to release information regarding their care.   Consent: Patient/Guardian gives verbal consent for treatment and assignment of benefits for services provided during this visit. Patient/Guardian expressed understanding and agreed to proceed.    Follow Up Instructions: Follow  up in 3 months or sooner if needed    I discussed the assessment and treatment plan with the patient. The patient was provided an opportunity to ask questions and all were answered. The patient agreed with the plan and demonstrated an understanding of the instructions.   The patient was advised to call back or seek an in-person evaluation if the symptoms worsen or if the condition fails to improve as anticipated.  I provided 13 minutes of non-face-to-face time during this encounter.   [name], MD

## 2022-09-16 ENCOUNTER — Ambulatory Visit (HOSPITAL_COMMUNITY)
Admission: RE | Admit: 2022-09-16 | Discharge: 2022-09-16 | Disposition: A | Discharge status: Home / Self Care | Payer: BC Managed Care – PPO | Source: Ambulatory Visit | Attending: Psychiatry | Admitting: Psychiatry

## 2022-09-16 DIAGNOSIS — Z79899 Other long term (current) drug therapy: Secondary | ICD-10-CM | POA: Insufficient documentation

## 2022-10-01 ENCOUNTER — Ambulatory Visit (INDEPENDENT_AMBULATORY_CARE_PROVIDER_SITE_OTHER): Payer: BC Managed Care – PPO | Admitting: Psychiatry

## 2022-10-01 DIAGNOSIS — F331 Major depressive disorder, recurrent, moderate: Secondary | ICD-10-CM | POA: Diagnosis not present

## 2022-10-01 DIAGNOSIS — F411 Generalized anxiety disorder: Secondary | ICD-10-CM
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
## 2022-10-17 ENCOUNTER — Ambulatory Visit (HOSPITAL_COMMUNITY): Payer: BC Managed Care – PPO | Admitting: Psychiatry

## 2022-11-10 ENCOUNTER — Ambulatory Visit (HOSPITAL_COMMUNITY): Payer: BC Managed Care – PPO | Admitting: Psychiatry

## 2022-11-10 DIAGNOSIS — F331 Major depressive disorder, recurrent, moderate: Secondary | ICD-10-CM | POA: Diagnosis not present

## 2022-11-10 DIAGNOSIS — F411 Generalized anxiety disorder: Secondary | ICD-10-CM
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
## 2022-11-12 LAB — LAB REPORT - SCANNED: EGFR: 106

## 2022-11-25 ENCOUNTER — Ambulatory Visit (INDEPENDENT_AMBULATORY_CARE_PROVIDER_SITE_OTHER): Payer: BC Managed Care – PPO | Admitting: Psychiatry

## 2022-11-25 DIAGNOSIS — F331 Major depressive disorder, recurrent, moderate: Secondary | ICD-10-CM

## 2022-11-25 DIAGNOSIS — F411 Generalized anxiety disorder: Secondary | ICD-10-CM
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
## 2022-11-27 ENCOUNTER — Telehealth (HOSPITAL_BASED_OUTPATIENT_CLINIC_OR_DEPARTMENT_OTHER): Payer: BC Managed Care – PPO | Admitting: Psychiatry

## 2022-11-27 ENCOUNTER — Telehealth (HOSPITAL_COMMUNITY): Payer: Self-pay | Admitting: Psychiatry

## 2022-11-27 DIAGNOSIS — F331 Major depressive disorder, recurrent, moderate: Secondary | ICD-10-CM

## 2022-11-27 DIAGNOSIS — R454 Irritability and anger: Secondary | ICD-10-CM | POA: Diagnosis not present

## 2022-11-27 DIAGNOSIS — F411 Generalized anxiety disorder: Secondary | ICD-10-CM

## 2022-11-27 MED ORDER — ZIPRASIDONE HCL 60 MG PO CAPS: 60.0000 mg | ORAL_CAPSULE | Freq: Two times a day (BID) | ORAL | 0 refills | Status: DC

## 2022-11-27 MED ORDER — BUSPIRONE HCL 10 MG PO TABS: 20.0000 mg | ORAL_TABLET | Freq: Two times a day (BID) | ORAL | 0 refills | Status: DC

## 2022-11-27 MED ORDER — VENLAFAXINE HCL ER 75 MG PO CP24: 225.0000 mg | ORAL_CAPSULE | Freq: Every day | ORAL | 0 refills | Status: DC

## 2022-11-27 MED ORDER — ALPRAZOLAM 0.5 MG PO TABS: 0.5000 mg | ORAL_TABLET | Freq: Every evening | ORAL | 2 refills | Status: DC | PRN

## 2022-11-27 MED ORDER — PROPRANOLOL HCL 20 MG PO TABS: 20.0000 mg | ORAL_TABLET | Freq: Every day | ORAL | 0 refills | Status: DC | PRN

## 2022-11-27 NOTE — Progress Notes (Signed)
Virtual Visit via Video Note  I connected with [name] on 11/27/22 at  8:30 AM EST by  a video enabled telemedicine application and verified that I am speaking with the correct person using two identifiers.  Location: Patient: at work Provider: office   I discussed the limitations of evaluation and management by telemedicine and the availability of in person appointments. The patient expressed understanding and agreed to proceed.  History of Present Illness: [name] shares that she continues to experience anxiety at work. At times it is worse. She is easily frustrated and angry at work. She yells more she would like to. She has muscle tension and thinking about the worst kind of thing. At home she does very mild anxiety. The Xanax really helps her at night so she can fall asleep. The Propranolol seems to be working. "The depression has actually been really good". She denies crying spells and hopelessness. [name] denies feeling depressed in the last 2 weeks. She denies isolation and anhedonia. She does sleep more than she should and thinks it is due to her thyroid. [name] denies SI/HI. [name] is working on managing anxiety in therapy and thinks it is going well. She feels the meds are effective and denies SE.     Observations/Objective: Psychiatric Specialty Exam: ROS  There were no vitals taken for this visit.There is no height or weight on file to calculate BMI.  General Appearance: Fairly Groomed and Neat  Eye Contact:  Good  Speech:  Clear and Coherent and Normal Rate  Volume:  Normal  Mood:  Anxious  Affect:  Full Range  Thought Process:  Goal Directed, Linear, and Descriptions of Associations: Intact  Orientation:  Full (Time, Place, and Person)  Thought Content:  Logical  Suicidal Thoughts:  No  Homicidal Thoughts:  No  Memory:  Immediate;   Good  Judgement:  Good  Insight:  Good  Psychomotor Activity:  Normal  Concentration:  Concentration: Good  Recall:  Good  Fund  of Knowledge:  Good  Language:  Good  Akathisia:  No  Handed:  Right  AIMS (if indicated):     Assets:  Communication Skills Desire for Improvement Financial Resources/Insurance Housing Resilience Social Support Talents/Skills Transportation Vocational/Educational  ADL's:  Intact  Cognition:  WNL  Sleep:        Assessment and Plan:     11/27/2022    8:33 AM 11/25/2022   11:13 AM 11/10/2022   11:18 AM 09/11/2022    8:56 AM 06/12/2022    8:50 AM  Depression screen PHQ 2/9  Decreased Interest 0 1 1 0 0  Down, Depressed, Hopeless 0 0 0 1 1  PHQ - 2 Score 0 1 1 1 1    $ Flowsheet Row Video Visit from 11/27/2022 in [facility] Counselor from 11/10/2022 in [facility] [place] Video Visit from 09/11/2022 in [place] No Risk Low Risk No Risk          Pt is aware that these meds carry a teratogenic risk. Pt will discuss plan of action if she does or plans to become pregnant in the future.  Status of current problems: ongoing situational anxiety that she is working, ongoing anger and irritability, depression is stable   Medication management with supportive therapy. Risks and benefits, side effects and alternative treatment options discussed with patient. Pt was given an opportunity to ask questions about medication, illness, and treatment. All  current psychiatric medications have been reviewed and discussed with the patient and adjusted as clinically appropriate.  Pt verbalized understanding and verbal consent obtained for treatment.  Meds:  1. GAD (generalized anxiety disorder) - ALPRAZolam (XANAX) 0.5 MG tablet; Take 1 tablet (0.5 mg total) by mouth at bedtime as needed for anxiety or sleep.  Dispense: 30 tablet; Refill: 2 - busPIRone (BUSPAR) 10 MG tablet; Take 2 tablets (20 mg total) by mouth 2 (two) times daily.  Dispense: 360 tablet;  Refill: 0 - venlafaxine XR (EFFEXOR-XR) 75 MG 24 hr capsule; Take 3 capsules (225 mg total) by mouth daily with breakfast.  Dispense: 270 capsule; Refill: 0 - ziprasidone (GEODON) 60 MG capsule; Take 1 capsule (60 mg total) by mouth 2 (two) times daily with a meal.  Dispense: 180 capsule; Refill: 0 - propranolol (INDERAL) 20 MG tablet; Take 1 tablet (20 mg total) by mouth daily as needed (anxiety).  Dispense: 90 tablet; Refill: 0  2. Irritability and anger - ziprasidone (GEODON) 60 MG capsule; Take 1 capsule (60 mg total) by mouth 2 (two) times daily with a meal.  Dispense: 180 capsule; Refill: 0  3. MDD (major depressive disorder), recurrent episode, moderate (HCC) - busPIRone (BUSPAR) 10 MG tablet; Take 2 tablets (20 mg total) by mouth 2 (two) times daily.  Dispense: 360 tablet; Refill: 0 - venlafaxine XR (EFFEXOR-XR) 75 MG 24 hr capsule; Take 3 capsules (225 mg total) by mouth daily with breakfast.  Dispense: 270 capsule; Refill: 0 - ziprasidone (GEODON) 60 MG capsule; Take 1 capsule (60 mg total) by mouth 2 (two) times daily with a meal.  Dispense: 180 capsule; Refill: 0     Labs:  09/16/22 EKG Qtc 438 Request Medical Records from PCP [facility] in [place]. She had labs drawn at her PCP office last month.   Therapy: brief supportive therapy provided. Discussed psychosocial stressors in detail.      Collaboration of Care: Other n/a  Patient/Guardian was advised Release of Information must be obtained prior to any record release in order to collaborate their care with an outside provider. Patient/Guardian was advised if they have not already done so to contact the registration department to sign all necessary forms in order for [place] to release information regarding their care.   Consent: Patient/Guardian gives verbal consent for treatment and assignment of benefits for services provided during this visit. Patient/Guardian expressed understanding and agreed to proceed.       Follow Up Instructions: Follow up in 3 months or sooner if needed    I discussed the assessment and treatment plan with the patient. The patient was provided an opportunity to ask questions and all were answered. The patient agreed with the plan and demonstrated an understanding of the instructions.   The patient was advised to call back or seek an in-person evaluation if the symptoms worsen or if the condition fails to improve as anticipated.  I provided 12 minutes of non-face-to-face time during this encounter.   Charlcie Cradle, MD

## 2022-12-12 ENCOUNTER — Ambulatory Visit (HOSPITAL_COMMUNITY): Payer: BC Managed Care – PPO | Admitting: Psychiatry

## 2022-12-26 ENCOUNTER — Ambulatory Visit (INDEPENDENT_AMBULATORY_CARE_PROVIDER_SITE_OTHER): Payer: BC Managed Care – PPO | Admitting: Psychiatry

## 2022-12-26 DIAGNOSIS — F411 Generalized anxiety disorder: Secondary | ICD-10-CM
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
## 2023-01-16 ENCOUNTER — Ambulatory Visit (HOSPITAL_COMMUNITY): Payer: BC Managed Care – PPO | Admitting: Psychiatry

## 2023-01-16 ENCOUNTER — Telehealth (HOSPITAL_COMMUNITY): Payer: Self-pay | Admitting: Psychiatry

## 2023-01-16 NOTE — Telephone Encounter (Signed)
Therapist attempted to contact patient via text for scheduled appointment through care agility platform, no response.  Therapist called patient who indicated she was unable to do appointment as she had not been relieved by another staff member at her job.  Patient agreed to attend upcoming appointment on 4/19.

## 2023-01-30 ENCOUNTER — Ambulatory Visit (INDEPENDENT_AMBULATORY_CARE_PROVIDER_SITE_OTHER): Payer: BC Managed Care – PPO | Admitting: Psychiatry

## 2023-01-30 ENCOUNTER — Encounter (HOSPITAL_COMMUNITY): Payer: Self-pay

## 2023-01-30 DIAGNOSIS — F411 Generalized anxiety disorder: Secondary | ICD-10-CM | POA: Diagnosis not present
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
## 2023-02-11 ENCOUNTER — Encounter: Payer: Self-pay | Admitting: Internal Medicine

## 2023-02-11 ENCOUNTER — Ambulatory Visit (INDEPENDENT_AMBULATORY_CARE_PROVIDER_SITE_OTHER): Payer: BC Managed Care – PPO | Admitting: Internal Medicine

## 2023-02-11 VITALS — BP 141/66 | HR 72 | Temp 97.8°F | Ht 65.0 in | Wt >= 6400 oz

## 2023-02-11 DIAGNOSIS — K648 Other hemorrhoids: Secondary | ICD-10-CM

## 2023-02-11 DIAGNOSIS — K625 Hemorrhage of anus and rectum: Secondary | ICD-10-CM | POA: Diagnosis not present

## 2023-02-11 DIAGNOSIS — K6289 Other specified diseases of anus and rectum: Secondary | ICD-10-CM | POA: Diagnosis not present

## 2023-02-11 DIAGNOSIS — K5904 Chronic idiopathic constipation: Secondary | ICD-10-CM | POA: Diagnosis not present

## 2023-02-11 NOTE — Progress Notes (Signed)
Primary Care Physician:  Marylynn Pearson, FNP Primary Gastroenterologist:  Dr. Marletta Lor  Chief Complaint  Patient presents with   Rectal Bleeding    Having some rectal bleeding, pain when having BM, reports bleeding is worse. Has always had some bleeding with her hemorrhoids but it is now worse. For constipation she uses probiotic and stool softners.     HPI:   [name] is a 47 y.o. female who presents to clinic today by referral from her PCP [facility] for evaluation.  Patient states she has had issues with constipation her whole life.  Currently taking once daily stool softener as well as probiotics which seem to help.  Over the last few months she has had worsening rectal discomfort with bowel movements.  Feels like something is "tearing" when she has a hard bowel movement.  Has also noted increased rectal bleeding.  Some clots.  Bright red blood.  States she had a colonoscopy over 10 years ago and was told that she has internal hemorrhoids.  No family history of colorectal malignancy.  No abdominal pain.  Patient has significant morbid obesity, weighing 560 pounds today, BMI 93.    Past Medical History:  Diagnosis Date   Allergy    Anxiety    Arthritis    Asthma    Breast nodule 01/02/2015   Depression    Fatigue    GERD (gastroesophageal reflux disease)    Herpes    HPV in female    IBS (irritable bowel syndrome)    Obesity    OSA (obstructive sleep apnea)    PMDD (premenstrual dysphoric disorder)    Thyroid disease    hypothryoidism   Trauma    Vaginal Pap smear, abnormal    Vitamin D deficiency     Past Surgical History:  Procedure Laterality Date   CESAREAN SECTION     CHOLECYSTECTOMY     TONSILECTOMY/ADENOIDECTOMY WITH MYRINGOTOMY     WISDOM TOOTH EXTRACTION      Current Outpatient Medications  Medication Sig Dispense Refill   ALPRAZolam (XANAX) 0.5 MG tablet Take 1 tablet (0.5 mg total) by mouth at bedtime as needed for anxiety or  sleep. 30 tablet 2   atorvastatin (LIPITOR) 20 MG tablet Take 20 mg by mouth daily.     augmented betamethasone dipropionate (DIPROLENE-AF) 0.05 % cream      busPIRone (BUSPAR) 10 MG tablet Take 2 tablets (20 mg total) by mouth 2 (two) times daily. 360 tablet 0   cetirizine (ZYRTEC) 10 MG tablet Take 10 mg by mouth daily.     Cholecalciferol (VITAMIN D3 PO) Take 1 tablet by mouth daily in the afternoon.     Cyanocobalamin (VITAMIN B12 PO) Take by mouth. One daily     levalbuterol (XOPENEX HFA) 45 MCG/ACT inhaler Inhale into the lungs every 4 (four) hours as needed for wheezing.     levothyroxine (SYNTHROID) 200 MCG tablet Take 1 tablet (200 mcg total) by mouth daily. 90 tablet 1   levothyroxine (SYNTHROID) 75 MCG tablet Take 1 tablet (75 mcg total) by mouth daily before breakfast. 90 tablet 1   Misc Natural Products (CRANBERRY/PROBIOTIC PO) Take by mouth daily. Cranberry/Probiotic/Prebiotic Blend-2 daily     montelukast (SINGULAIR) 10 MG tablet Take 10 mg by mouth daily.      Multiple Vitamins-Minerals (WOMENS MULTI VITAMIN & MINERAL PO) Take by mouth daily.     norethindrone (AYGESTIN) 5 MG tablet TAKE 2 TABLETS BY MOUTH DAILY 60 tablet 3  propranolol (INDERAL) 20 MG tablet Take 1 tablet (20 mg total) by mouth daily as needed (anxiety). 90 tablet 0   venlafaxine XR (EFFEXOR-XR) 75 MG 24 hr capsule Take 3 capsules (225 mg total) by mouth daily with breakfast. 270 capsule 0   ziprasidone (GEODON) 60 MG capsule Take 1 capsule (60 mg total) by mouth 2 (two) times daily with a meal. 180 capsule 0   No current facility-administered medications for this visit.    Allergies as of 02/11/2023 - Review Complete 02/11/2023  Allergen Reaction Noted   Latex  11/01/2009   Naproxen     Shrimp [shellfish allergy] Other (See Comments) 05/12/2017    Family History  Problem Relation Age of Onset   Hypertension Mother    Arthritis Mother    Anxiety disorder Mother    Lung cancer Mother    Sleep apnea  Mother    Asthma Father    COPD Father    Arthritis Father    Chronic bronchitis Father    Hypertension Father    Hyperlipidemia Father    Anxiety disorder Father    Depression Father    Alcohol abuse Father    Drug abuse Brother    Alcohol abuse Brother    Hypertension Brother    Mental illness Brother    Depression Brother    Hyperlipidemia Brother    Hearing loss Brother    Asthma Brother    Learning disabilities Brother    Asthma Maternal Grandmother    Arthritis Maternal Grandmother    Hypertension Maternal Grandmother    Congestive Heart Failure Maternal Grandmother    Diabetes Maternal Grandfather    Stroke Maternal Grandfather    ADD / ADHD Son    Cancer Maternal Aunt    Cancer Maternal Uncle    Sleep apnea Maternal Uncle    Cancer Paternal Aunt    Cancer Paternal Uncle     Social History   Socioeconomic History   Marital status: Married    Spouse name: Not on file   Number of children: 2   Years of education: Not on file   Highest education level: Associate degree: occupational, Scientist, product/process development, or vocational program  Occupational History   Not on file  Tobacco Use   Smoking status: Former    Passive exposure: Past   Smokeless tobacco: Never   Tobacco comments:    Smoked "on occasion around friends" in high school "because it looked cool"  Vaping Use   Vaping Use: Never used  Substance and Sexual Activity   Alcohol use: Yes    Comment: occ   Drug use: No   Sexual activity: Yes    Birth control/protection: Pill    Comment: takes norethindrone acetate for heavy menses  Other Topics Concern   Not on file  Social History Narrative   Social Hx:   Current living situation- living in [place] with husband, son is 50/50 custody with biological father, daughter lives alone. Patient's mother lives with her.    Raised in [place] by mom and dad   Siblings- 2 brothers and pt is the youngest. She is 12 yrs younger than her youngest brother   Schooling-  associates degree in early childhood   Married- yes - currently in 3rd marriage   Kids- 2 from 1st marriage      Legal issues- denies      Caffeine: all day long everyday, unable to quantify    Social Determinants of Health   Financial Resource Strain: Not  on file  Food Insecurity: Not on file  Transportation Needs: Not on file  Physical Activity: Not on file  Stress: Not on file  Social Connections: Not on file  Intimate Partner Violence: Not on file    Subjective: Review of Systems  Constitutional:  Negative for chills and fever.  HENT:  Negative for congestion and hearing loss.   Eyes:  Negative for blurred vision and double vision.  Respiratory:  Negative for cough and shortness of breath.   Cardiovascular:  Negative for chest pain and palpitations.  Gastrointestinal:  Positive for blood in stool and constipation. Negative for abdominal pain, diarrhea, heartburn, melena and vomiting.  Genitourinary:  Negative for dysuria and urgency.  Musculoskeletal:  Negative for joint pain and myalgias.  Skin:  Negative for itching and rash.  Neurological:  Negative for dizziness and headaches.  Psychiatric/Behavioral:  Negative for depression. The patient is not nervous/anxious.        Objective: BP (!) 141/66   Pulse 72   Temp 97.8 F (36.6 C) (Oral)   Ht 5\' 5"  (1.651 m)   Wt (!) 560 lb (254 kg)   BMI 93.19 kg/m  Physical Exam Constitutional:      Appearance: Normal appearance. She is obese.  HENT:     Head: Normocephalic and atraumatic.  Eyes:     Extraocular Movements: Extraocular movements intact.     Conjunctiva/sclera: Conjunctivae normal.  Cardiovascular:     Rate and Rhythm: Normal rate and regular rhythm.  Pulmonary:     Effort: Pulmonary effort is normal.     Breath sounds: Normal breath sounds.  Abdominal:     General: Bowel sounds are normal.     Palpations: Abdomen is soft.  Musculoskeletal:        General: No swelling. Normal range of motion.      Cervical back: Normal range of motion and neck supple.  Skin:    General: Skin is warm and dry.     Coloration: Skin is not jaundiced.  Neurological:     General: No focal deficit present.     Mental Status: She is alert and oriented to person, place, and time.  Psychiatric:        Mood and Affect: Mood normal.        Behavior: Behavior normal.   Unable to perform rectal exam due to body habitus.   Assessment: *Chronic constipation *Rectal bleeding *Internal hemorrhoids *Rectal pain  Plan: Patient presenting with chronic constipation, rectal pain discomfort consistent with anal fissure.  Unable to perform rectal exam today due to body habitus.  Will send in topical nitroglycerin cream to use twice daily.  Increase stool softener to twice daily.  If this is not adequate she can add on once daily Dulcolax.  Discussed possibility of colonoscopy with patient.  Given her BMI this would need to be done in [place].  Will treat with above and see how she does.  Follow-up in 6 to 8 weeks, if not improved then will refer to [facility] for colonoscopy.  Thank you Tillie Fantasia for the kind referral.  02/11/2023 9:09 AM   Disclaimer: This note was dictated with voice recognition software. Similar sounding words can inadvertently be transcribed and may not be corrected upon review.

## 2023-02-11 NOTE — Patient Instructions (Signed)
For your chronic constipation, I want you to start taking stool softener twice a day and see how you do.  If not adequately improve then would add on once daily Dulcolax (bisacodyl)  I am going to send you an topical nitroglycerin cream to [place] for your anal fissure.  Follow-up in 6 to 8 weeks.  We may need to pursue colonoscopy though we will reevaluate at that time.  It was very nice meeting you today.  Dr. Marletta Lor

## 2023-02-12 ENCOUNTER — Telehealth (HOSPITAL_BASED_OUTPATIENT_CLINIC_OR_DEPARTMENT_OTHER): Payer: BC Managed Care – PPO | Admitting: Psychiatry

## 2023-02-12 DIAGNOSIS — F331 Major depressive disorder, recurrent, moderate: Secondary | ICD-10-CM

## 2023-02-12 DIAGNOSIS — R454 Irritability and anger: Secondary | ICD-10-CM

## 2023-02-12 DIAGNOSIS — F411 Generalized anxiety disorder: Secondary | ICD-10-CM | POA: Diagnosis not present

## 2023-02-12 MED ORDER — ZIPRASIDONE HCL 60 MG PO CAPS: 60.0000 mg | ORAL_CAPSULE | Freq: Two times a day (BID) | ORAL | 1 refills | Status: DC

## 2023-02-12 MED ORDER — BUSPIRONE HCL 10 MG PO TABS: 20.0000 mg | ORAL_TABLET | Freq: Two times a day (BID) | ORAL | 1 refills | Status: DC

## 2023-02-12 MED ORDER — ALPRAZOLAM 0.5 MG PO TABS: 0.5000 mg | ORAL_TABLET | Freq: Every evening | ORAL | 5 refills | Status: DC | PRN

## 2023-02-12 MED ORDER — PROPRANOLOL HCL 20 MG PO TABS: 20.0000 mg | ORAL_TABLET | Freq: Every day | ORAL | 1 refills | Status: DC | PRN

## 2023-02-12 MED ORDER — VENLAFAXINE HCL ER 75 MG PO CP24: 225.0000 mg | ORAL_CAPSULE | Freq: Every day | ORAL | 1 refills | Status: DC

## 2023-02-12 NOTE — Progress Notes (Signed)
Virtual Visit via Video Note  I connected with [name] on 02/12/23 at 11:00 AM EDT by a video enabled telemedicine application and verified that I am speaking with the correct person using two identifiers.  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 The patient expressed understanding and agreed to proceed.  History of Present Illness: [name] shares the depression is well controlled. The meds and therapy are helping. She feels depressed a few days a month. In the last 2 weeks she denies any depression. She denies isolation and anhedonia. [name] denies SI/HI.  Her main problem is anxiety. She is frustrated easily and her anxiety is mostly centered on her job.  At home she stresses about finances but that is manageable. She feels anxious every day. She feels fatigued daily and has occasional due to the anxiety. 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 The irritability is ongoing but Geodon does help but work is a huge contributor that meds can not help. Her sleep is good and she taking Xanax every night.The meds are working well and she denies SE.     Observations/Objective: Psychiatric Specialty Exam: ROS  There were no vitals taken for this visit.There is no height or weight on file to calculate BMI.  General Appearance: Neat and Well Groomed  Eye Contact:  Fair  Speech:  Clear and Coherent and Normal Rate  Volume:  Normal  Mood:  Euthymic  Affect:  Full Range  Thought Process:  Goal Directed, Linear, and Descriptions of Associations: Intact  Orientation:  Full (Time, Place, and Person)  Thought Content:  Logical  Suicidal Thoughts:  No  Homicidal Thoughts:  No  Memory:  Immediate;   Good  Judgement:  Good  Insight:  Good  Psychomotor Activity:  Normal  Concentration:  Concentration: Good   Recall:  Good  Fund of Knowledge:  Good  Language:  Good  Akathisia:  No  Handed:  Right  AIMS (if indicated):     Assets:  Communication Skills Desire for Improvement Financial Resources/Insurance Housing Leisure Time Resilience Social Support Talents/Skills Transportation Vocational/Educational  ADL's:  Intact  Cognition:  WNL  Sleep:        Assessment and Plan:     02/12/2023   11:09 AM 11/27/2022    8:33 AM 11/25/2022   11:13 AM 11/10/2022   11:18 AM 09/11/2022    8:56 AM  Depression screen PHQ 2/9  Decreased Interest 0 0 1 1 0  Down, Depressed, Hopeless 0 0 0 0 1  PHQ - 2 Score 0 0 1 1 1     Flowsheet Row Video Visit [facility]/12/2023 in [facility] Video Visit from 11/27/2022 in [facility] Counselor from 11/10/2022 in [facility] [place]  C-SSRS RISK CATEGORY No Risk No Risk Low Risk          Pt is aware that these meds carry a teratogenic risk. Pt will discuss plan of action if she does or plans to become pregnant in the future.  Status of current problems: depression is improved. Ongoing anxiety and irritability due to work stress.    Medication management with supportive therapy. Risks and benefits, side effects and alternative treatment options discussed with patient. Pt was given an opportunity to ask questions about medication, illness, and treatment. All current  psychiatric medications have been reviewed and discussed with the patient and adjusted as clinically appropriate.  Pt verbalized understanding and verbal consent obtained for treatment.  Meds:  1. GAD (generalized anxiety disorder) - ALPRAZolam (XANAX) 0.5 MG tablet; Take 1 tablet (0.5 mg total) by mouth at bedtime as needed for anxiety or sleep.  Dispense: 30 tablet; Refill: 5 - busPIRone (BUSPAR) 10 MG tablet; Take 2 tablets (20 mg total) by mouth 2 (two) times daily.  Dispense: 360  tablet; Refill: 1 - propranolol (INDERAL) 20 MG tablet; Take 1 tablet (20 mg total) by mouth daily as needed (anxiety).  Dispense: 90 tablet; Refill: 1 - venlafaxine XR (EFFEXOR-XR) 75 MG 24 hr capsule; Take 3 capsules (225 mg total) by mouth daily with breakfast.  Dispense: 270 capsule; Refill: 1 - ziprasidone (GEODON) 60 MG capsule; Take 1 capsule (60 mg total) by mouth 2 (two) times daily with a meal.  Dispense: 180 capsule; Refill: 1  2. Irritability and anger - ziprasidone (GEODON) 60 MG capsule; Take 1 capsule (60 mg total) by mouth 2 (two) times daily with a meal.  Dispense: 180 capsule; Refill: 1  3. MDD (major depressive disorder), recurrent episode, moderate (HCC) - busPIRone (BUSPAR) 10 MG tablet; Take 2 tablets (20 mg total) by mouth 2 (two) times daily.  Dispense: 360 tablet; Refill: 1 - venlafaxine XR (EFFEXOR-XR) 75 MG 24 hr capsule; Take 3 capsules (225 mg total) by mouth daily with breakfast.  Dispense: 270 capsule; Refill: 1 - ziprasidone (GEODON) 60 MG capsule; Take 1 capsule (60 mg total) by mouth 2 (two) times daily with a meal.  Dispense: 180 capsule; Refill: 1     Labs: next EKG due December 2024. She had labs done thru her PCP. Pt is willing to sign a release to get her records.  I will have staff contact her to do so.    Therapy: brief supportive therapy provided. Discussed psychosocial stressors in detail.      Collaboration of Care: Other encouraged to continue therapy  Patient/Guardian was advised Release of Information must be obtained prior to any record release in order to collaborate their care with an outside provider. Patient/Guardian was advised if they have not already done so to contact the registration department to sign all necessary forms in order for [place] to release information regarding their care.   Consent: Patient/Guardian gives verbal consent for treatment and assignment of benefits for services provided during this visit. Patient/Guardian  expressed understanding and agreed to proceed.       Follow Up Instructions: Follow up in 5 months or sooner if needed  Patient informed that I am leaving [facility] in 04/2023 and I relayed that they will be getting a new provider after that. Patient verbalized understanding and agreed with the plan.     I discussed the assessment and treatment plan with the patient. The patient was provided an opportunity to ask questions and all were answered. The patient agreed with the plan and demonstrated an understanding of the instructions.   The patient was advised to call back or seek an in-person evaluation if the symptoms worsen or if the condition fails to improve as anticipated.  I provided 14 minutes of non-face-to-face time during this encounter.   [name], MD

## 2023-02-13 ENCOUNTER — Ambulatory Visit (HOSPITAL_COMMUNITY): Payer: BC Managed Care – PPO | Admitting: Psychiatry

## 2023-02-16 ENCOUNTER — Telehealth (HOSPITAL_COMMUNITY): Payer: Self-pay | Admitting: Psychiatry

## 2023-02-17 ENCOUNTER — Ambulatory Visit (INDEPENDENT_AMBULATORY_CARE_PROVIDER_SITE_OTHER): Payer: BC Managed Care – PPO | Admitting: Psychiatry

## 2023-02-17 DIAGNOSIS — F411 Generalized anxiety disorder: Secondary | ICD-10-CM | POA: Diagnosis not present
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
## 2023-03-04 ENCOUNTER — Encounter: Payer: Self-pay | Admitting: Family Medicine

## 2023-03-04 ENCOUNTER — Ambulatory Visit: Payer: BC Managed Care – PPO | Admitting: Adult Health

## 2023-03-04 ENCOUNTER — Ambulatory Visit: Payer: BC Managed Care – PPO | Admitting: Family Medicine

## 2023-03-04 VITALS — BP 154/65 | HR 82 | Ht 65.0 in | Wt >= 6400 oz

## 2023-03-04 DIAGNOSIS — G4733 Obstructive sleep apnea (adult) (pediatric): Secondary | ICD-10-CM

## 2023-03-04 NOTE — Patient Instructions (Signed)
Please continue using your CPAP regularly. While your insurance requires that you use CPAP at least 4 hours each night on 70% of the nights, I recommend, that you not skip any nights and use it throughout the night if you can. Getting used to CPAP and staying with the treatment long term does take time and patience and discipline. Untreated obstructive sleep apnea when it is moderate to severe can have an adverse impact on cardiovascular health and raise her risk for heart disease, arrhythmias, hypertension, congestive heart failure, stroke and diabetes. Untreated obstructive sleep apnea causes sleep disruption, nonrestorative sleep, and sleep deprivation. This can have an impact on your day to day functioning and cause daytime sleepiness and impairment of cognitive function, memory loss, mood disturbance, and problems focussing. Using CPAP regularly can improve these symptoms.  We will update supply orders, today. Continue to monitor for a air leak in your mask at home. Contact DME for any concerns with supplies or machine.   Follow up in 1 year

## 2023-03-04 NOTE — Progress Notes (Signed)
PATIENT: [name] DOB: Feb 27, 1976  REASON FOR VISIT: follow up HISTORY FROM: patient  Chief Complaint  Patient presents with   Follow-up    Pt in room 2. Here for cpap follow up. Pt reports doing well on cpap, sleeps well at night. Pt has dry mouth and nose not unbearable uses saline spray to help with nose. Pt blood pressure elevated x 2.     HISTORY OF PRESENT ILLNESS:  03/04/23 ALL:  [name] is a 47 y.o. female here today for follow up for OSA on CPAP.  She was last seen in follow up with Dr Frances Furbish 02/2022. ONO 03/2022 did not reveal need for O2. She continues to do well on therapy. She is using CPAP every night for about 8-9 hours. She continues to have a leak. She reports using same nasal pillow mask since starting therapy over a year ago. She does report owing DME money. She continues to feel tired and has daytime sleepiness. She is trying to find an endocrinologist that will see her. She is limited on transportation. She is considering seeing weight management. She continues regular follow up with her PCP. She is seeing psychology and psychiatry regularly.      HISTORY: (copied from Dr Teofilo Pod previous note)  [name] is a 47 year old right-handed woman with an underlying medical history of allergies, arthritis, anxiety, depression, reflux disease, thyroid disease, vitamin D deficiency, asthma, and morbid obesity with a BMI of over 80, who presents for follow-up consultation of her obstructive sleep apnea on AutoPap therapy.  The patient is unaccompanied today.  I first met her at the request of her primary care provider on 07/18/2021, at which time she reported snoring and excessive daytime somnolence as well as witnessed apneas.  I previous sleep study several years prior that showed mild sleep apnea.  She was advised to proceed with a sleep study.  She had a home sleep test on 08/26/2021, which indicated severe obstructive sleep apnea with an AHI of 87.3/h, O2  nadir 65% with significant time below or at 88% saturation of 263.7 minutes for the night.  She had variable snoring.  She was advised to proceed with AutoPap therapy.  Her set up date was 09/06/2021.  She has a ResMed air sense 11 AutoSet machine.   Today, 03/03/2022: I reviewed her AutoPap compliance data from 02/01/2022 through 03/02/2022, which is a total of 30 days, during which time she used her machine every night with percent use days greater than 4 hours at 100%, indicating superb compliance with an average usage of 9 hours and 46 minutes, residual AHI at goal at 0.9/h, average pressure for the 95th percentile at 13.9 cm with a range of 6 to 15 cm with EPR.  Leak on the higher side with the 95th percentile at 25.8 L/min.  She uses nasal pillows with good success.  She is very pleased with her outcome thus far and she is compliant with treatment, is benefiting from it and that she feels less sleepy during the day, nocturia significantly improved, after initial adjustment she has done really well.  Sometimes she feels the air blowing harder than others.  She is able to tolerate the interface and the pressure thus far.  She has been working on weight loss and recently started a trial of Wegovy some 2 weeks ago.  She is hoping to be able to continue with it.  She tries to hydrate well.  She does not always drink  enough water by self admission.  She sleeps with less restlessness.  Has been compliant since the beginning.  Her compliance data for the past 90 days also indicates full compliance.   REVIEW OF SYSTEMS: Out of a complete 14 system review of symptoms, the patient complains only of the following symptoms, fatigue, bilateral knee pain, anxiety, depression, and all other reviewed systems are negative.  ESS: 16/24  ALLERGIES: Allergies  Allergen Reactions   Latex    Naproxen    Shrimp [Shellfish Allergy] Other (See Comments)    Intolerance     HOME MEDICATIONS: Outpatient Medications Prior  to Visit  Medication Sig Dispense Refill   ALPRAZolam (XANAX) 0.5 MG tablet Take 1 tablet (0.5 mg total) by mouth at bedtime as needed for anxiety or sleep. 30 tablet 5   atorvastatin (LIPITOR) 20 MG tablet Take 20 mg by mouth daily.     busPIRone (BUSPAR) 10 MG tablet Take 2 tablets (20 mg total) by mouth 2 (two) times daily. 360 tablet 1   cetirizine (ZYRTEC) 10 MG tablet Take 10 mg by mouth daily.     Cholecalciferol (VITAMIN D3 PO) Take 1 tablet by mouth daily in the afternoon.     Cyanocobalamin (VITAMIN B12 PO) Take by mouth. One daily     Docusate Sodium (COLACE PO) Take 2 tablets by mouth in the morning and at bedtime.     levalbuterol (XOPENEX HFA) 45 MCG/ACT inhaler Inhale into the lungs every 4 (four) hours as needed for wheezing.     levothyroxine (SYNTHROID) 200 MCG tablet Take 1 tablet (200 mcg total) by mouth daily. 90 tablet 1   levothyroxine (SYNTHROID) 75 MCG tablet Take 1 tablet (75 mcg total) by mouth daily before breakfast. 90 tablet 1   Misc Natural Products (CRANBERRY/PROBIOTIC PO) Take by mouth daily. Cranberry/Probiotic/Prebiotic Blend-2 daily     montelukast (SINGULAIR) 10 MG tablet Take 10 mg by mouth daily.      Multiple Vitamins-Minerals (WOMENS MULTI VITAMIN & MINERAL PO) Take by mouth daily.     NONFORMULARY OR COMPOUNDED ITEM [facility] hemorrhoid cream.     norethindrone (AYGESTIN) 5 MG tablet TAKE 2 TABLETS BY MOUTH DAILY 60 tablet 3   propranolol (INDERAL) 20 MG tablet Take 1 tablet (20 mg total) by mouth daily as needed (anxiety). 90 tablet 1   venlafaxine XR (EFFEXOR-XR) 75 MG 24 hr capsule Take 3 capsules (225 mg total) by mouth daily with breakfast. 270 capsule 1   ziprasidone (GEODON) 60 MG capsule Take 1 capsule (60 mg total) by mouth 2 (two) times daily with a meal. 180 capsule 1   No facility-administered medications prior to visit.    PAST MEDICAL HISTORY: Past Medical History:  Diagnosis Date   Allergy    Anxiety    Arthritis    Asthma     Breast nodule 01/02/2015   Depression    Fatigue    GERD (gastroesophageal reflux disease)    Herpes    HPV in female    IBS (irritable bowel syndrome)    Obesity    OSA (obstructive sleep apnea)    PMDD (premenstrual dysphoric disorder)    Thyroid disease    hypothryoidism   Trauma    Vaginal Pap smear, abnormal    Vitamin D deficiency     PAST SURGICAL HISTORY: Past Surgical History:  Procedure Laterality Date   CESAREAN SECTION     CHOLECYSTECTOMY     TONSILECTOMY/ADENOIDECTOMY WITH MYRINGOTOMY     WISDOM TOOTH  EXTRACTION      FAMILY HISTORY: Family History  Problem Relation Age of Onset   Hypertension Mother    Arthritis Mother    Anxiety disorder Mother    Lung cancer Mother    Sleep apnea Mother    Asthma Father    COPD Father    Arthritis Father    Chronic bronchitis Father    Hypertension Father    Hyperlipidemia Father    Anxiety disorder Father    Depression Father    Alcohol abuse Father    Drug abuse Brother    Alcohol abuse Brother    Hypertension Brother    Mental illness Brother    Depression Brother    Hyperlipidemia Brother    Hearing loss Brother    Asthma Brother    Learning disabilities Brother    Asthma Maternal Grandmother    Arthritis Maternal Grandmother    Hypertension Maternal Grandmother    Congestive Heart Failure Maternal Grandmother    Diabetes Maternal Grandfather    Stroke Maternal Grandfather    ADD / ADHD Son    Cancer Maternal Aunt    Cancer Maternal Uncle    Sleep apnea Maternal Uncle    Cancer Paternal Aunt    Cancer Paternal Uncle     SOCIAL HISTORY: Social History   Socioeconomic History   Marital status: Married    Spouse name: Not on file   Number of children: 2   Years of education: Not on file   Highest education level: Associate degree: occupational, Scientist, product/process development, or vocational program  Occupational History   Not on file  Tobacco Use   Smoking status: Former    Passive exposure: Past    Smokeless tobacco: Never   Tobacco comments:    Smoked "on occasion around friends" in high school "because it looked cool"  Vaping Use   Vaping Use: Never used  Substance and Sexual Activity   Alcohol use: Yes    Comment: occ   Drug use: No   Sexual activity: Yes    Birth control/protection: Pill    Comment: takes norethindrone acetate for heavy menses  Other Topics Concern   Not on file  Social History Narrative   Social Hx:   Current living situation- living in [place] with husband, son is 50/50 custody with biological father, daughter lives alone. Patient's mother lives with her.    Raised in [place] by mom and dad   Siblings- 2 brothers and pt is the youngest. She is 63 yrs younger than her youngest brother   Schooling- associates degree in early childhood   Married- yes - currently in 3rd marriage   Kids- 2 from 1st marriage      Legal issues- denies      Caffeine: all day long everyday, unable to quantify    Social Determinants of Health   Financial Resource Strain: Not on file  Food Insecurity: Not on file  Transportation Needs: Not on file  Physical Activity: Not on file  Stress: Not on file  Social Connections: Not on file  Intimate Partner Violence: Not on file     PHYSICAL EXAM  Vitals:   03/04/23 1449 03/04/23 1454  BP: (!) 154/81 (!) 154/65  Pulse: 83 82  Weight: (!) 563 lb 8 oz (255.6 kg)   Height: 5\' 5"  (1.651 m)    Body mass index is 93.77 kg/m.  Generalized: Well developed, in no acute distress  Cardiology: normal rate and rhythm, no murmur noted Respiratory: clear  to auscultation bilaterally  Neurological examination  Mentation: Alert oriented to time, place, history taking. Follows all commands speech and language fluent Cranial nerve II-XII: Pupils were equal round reactive to light. Extraocular movements were full, visual field were full  Motor: The motor testing reveals 5 over 5 strength of all 4 extremities. Good symmetric motor  tone is noted throughout.  Gait and station: Gait is wide due to body habitus. Uses cane.    DIAGNOSTIC DATA (LABS, IMAGING, TESTING) - I reviewed patient records, labs, notes, testing and imaging myself where available.      No data to display           Lab Results  Component Value Date   WBC 5.6 03/26/2007   HGB 13.6 11/14/2009   HCT 39.6 11/14/2009   MCV 85.7 03/26/2007   PLT 358 03/26/2007      Component Value Date/Time   NA 137 11/14/2009 1255   K 3.7 11/14/2009 1255   CL 102 11/14/2009 1255   CO2 29 11/14/2009 1255   GLUCOSE 89 11/14/2009 1255   BUN 5 (L) 11/14/2009 1255   CREATININE 0.71 11/14/2009 1255   CALCIUM 9.2 11/14/2009 1255   GFRNONAA >60 11/14/2009 1255   GFRAA  11/14/2009 1255    >60        The eGFR has been calculated using the MDRD equation. This calculation has not been validated in all clinical situations. eGFR's persistently <60 mL/min signify possible Chronic Kidney Disease.   Lab Results  Component Value Date   CHOL 193 05/25/2020   HDL 42 05/25/2020   LDLCALC 100 05/25/2020   TRIG 102 05/25/2020   No results found for: "HGBA1C" No results found for: "VITAMINB12" Lab Results  Component Value Date   TSH 0.024 (L) 11/22/2021     ASSESSMENT AND PLAN 47 y.o. year old female  has a past medical history of Allergy, Anxiety, Arthritis, Asthma, Breast nodule (01/02/2015), Depression, Fatigue, GERD (gastroesophageal reflux disease), Herpes, HPV in female, IBS (irritable bowel syndrome), Obesity, OSA (obstructive sleep apnea), PMDD (premenstrual dysphoric disorder), Thyroid disease, Trauma, Vaginal Pap smear, abnormal, and Vitamin D deficiency. here with     ICD-10-CM   1. OSA on CPAP  G47.33 For home use only DME continuous positive airway pressure (CPAP)        Jurline L Aylesworth is doing well on CPAP therapy. Compliance report reveals excellent compliance. She was encouraged to continue using CPAP nightly and for greater than 4 hours  each night. She was encouraged to monitor for air leak at home. Ensure mask is well fitting. We will update supply orders as indicated. I have asked her to reach out to [facility] for replacement supplies. We have had a lengthy conversation regarding fatigue and co morbidities. She was encouraged to continue advocating for endocrinologist. Consider weight management specialist. Continue close follow up with behavioral specialists and PCP. Risks of untreated sleep apnea review and education materials provided. Healthy lifestyle habits encouraged. She will follow up in 1 year, sooner if needed. She verbalizes understanding and agreement with this plan.    Orders Placed This Encounter  Procedures   For home use only DME continuous positive airway pressure (CPAP)    Needs supplies. Reports never getting new supplies. Please call to assist.    Order Specific Question:   Length of Need    Answer:   Lifetime    Order Specific Question:   Patient has OSA or probable OSA    Answer:  Yes    Order Specific Question:   Is the patient currently using CPAP in the home    Answer:   Yes    Order Specific Question:   Settings    Answer:   Other see comments    Order Specific Question:   CPAP supplies needed    Answer:   Mask, headgear, cushions, filters, heated tubing and water chamber     No orders of the defined types were placed in this encounter.     Shawnie Dapper, FNP-C 03/04/2023, 3:57 PM [facility] [address], [address] [place], [place] [address] [phone]

## 2023-03-04 NOTE — Progress Notes (Signed)
Faxed supply order for CPAP to [facility] at [phone]. Received fax confirmation.

## 2023-03-12 IMAGING — MG MM DIGITAL SCREENING BILAT W/ TOMO AND CAD
8 of 18 series · 8 of 40 positions shown · non-contrast
Comparison: Previous exam(s).

CLINICAL DATA: Screening.

EXAM:
DIGITAL SCREENING BILATERAL MAMMOGRAM WITH TOMOSYNTHESIS AND CAD
TECHNIQUE: Bilateral screening digital craniocaudal and mediolateral oblique
mammograms were obtained. Bilateral screening digital breast
tomosynthesis was performed. The images were evaluated with
computer-aided detection.

[L MLO synth-2D (1 of 2)]
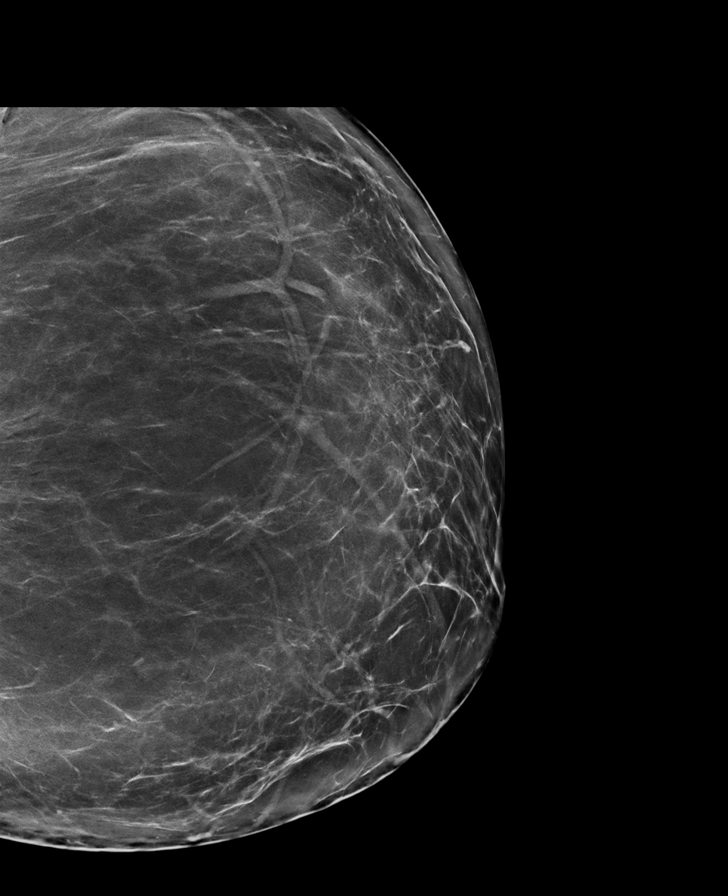

[L CC synth-2D (1 of 2)]
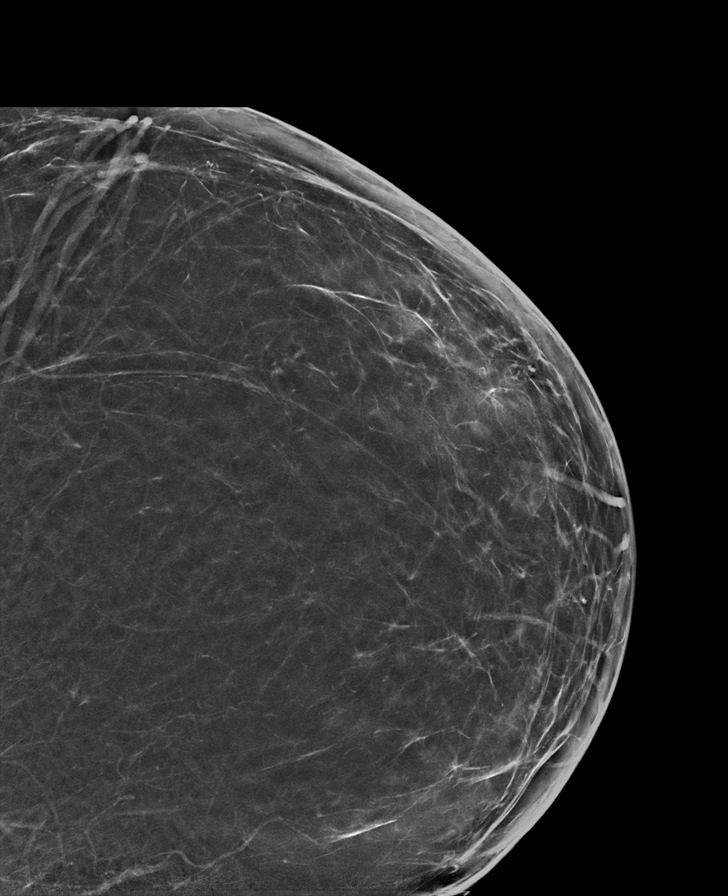

[L CC synth-2D (2 of 2)]
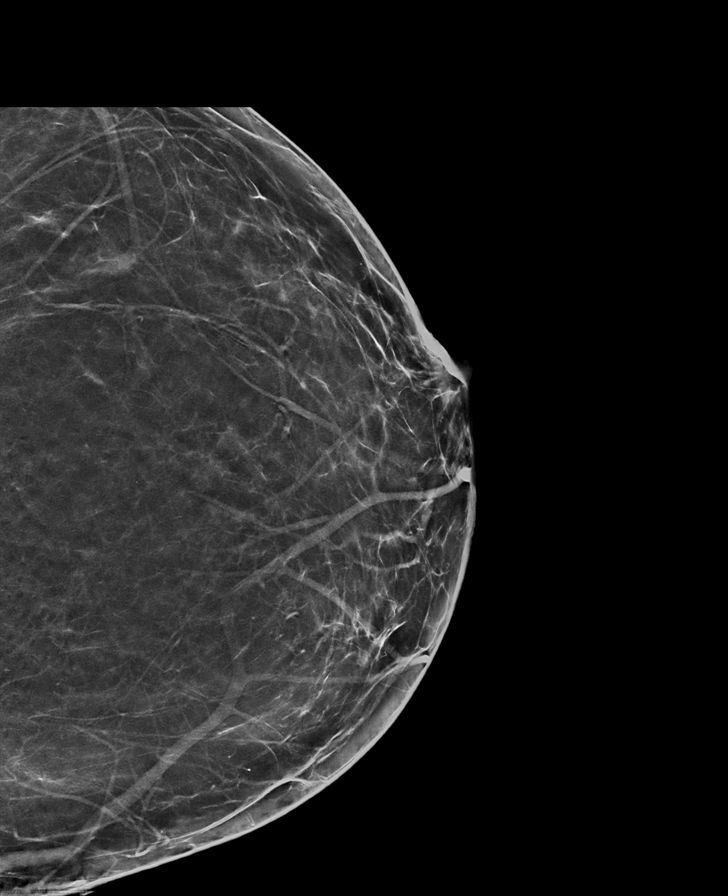

[L MLO synth-2D (2 of 2)]
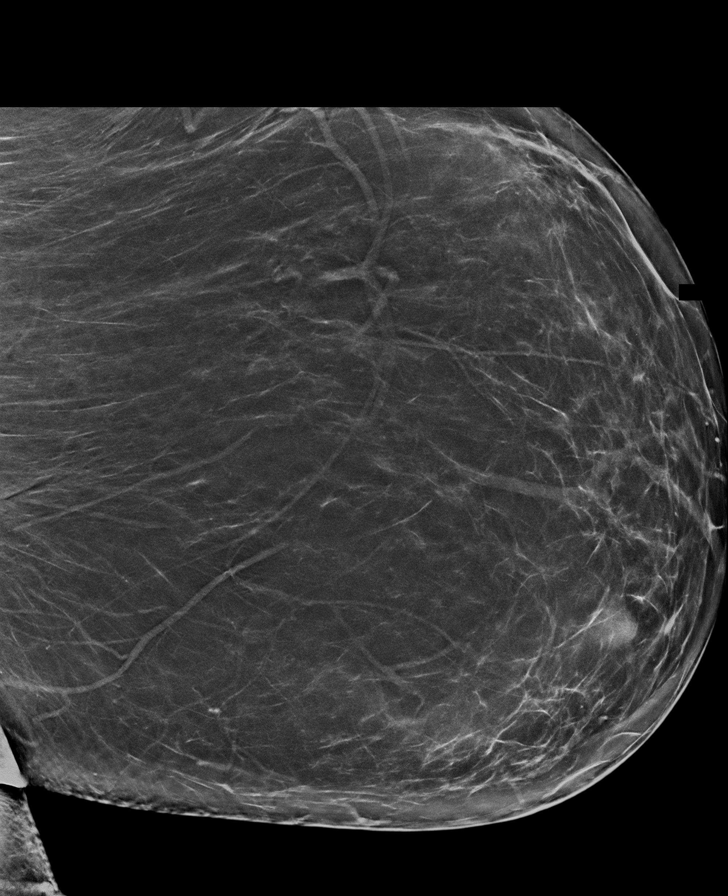

[R MLO synth-2D (1 of 2)]
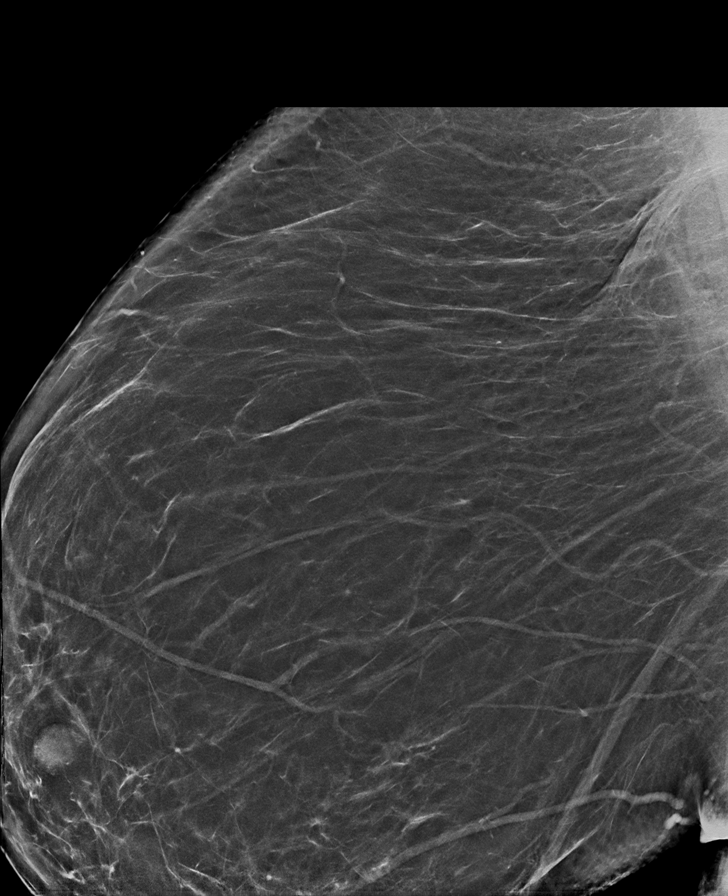

[R CC synth-2D]
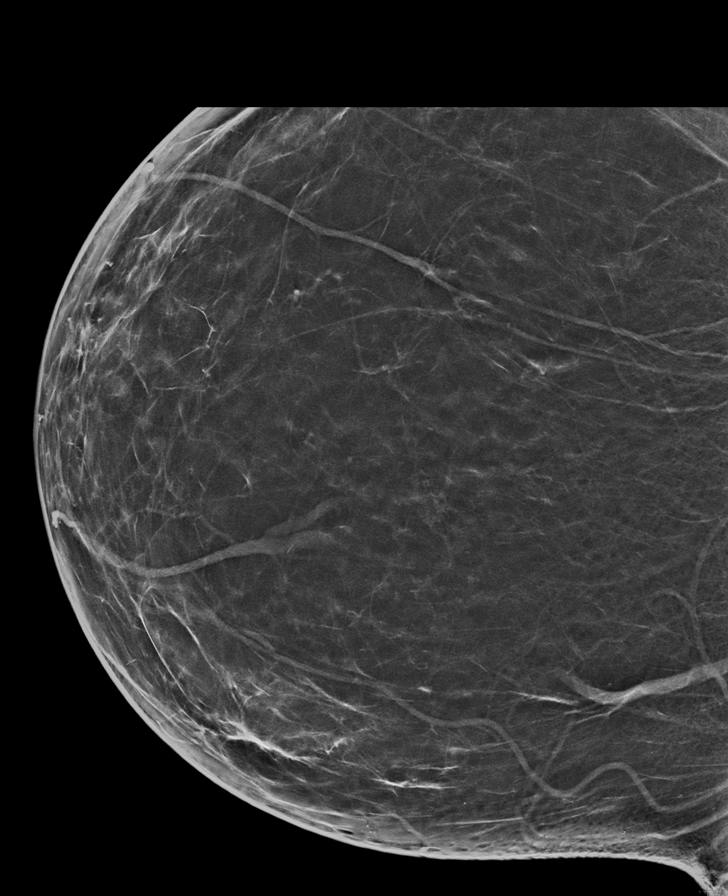

[R MLO synth-2D (2 of 2)]
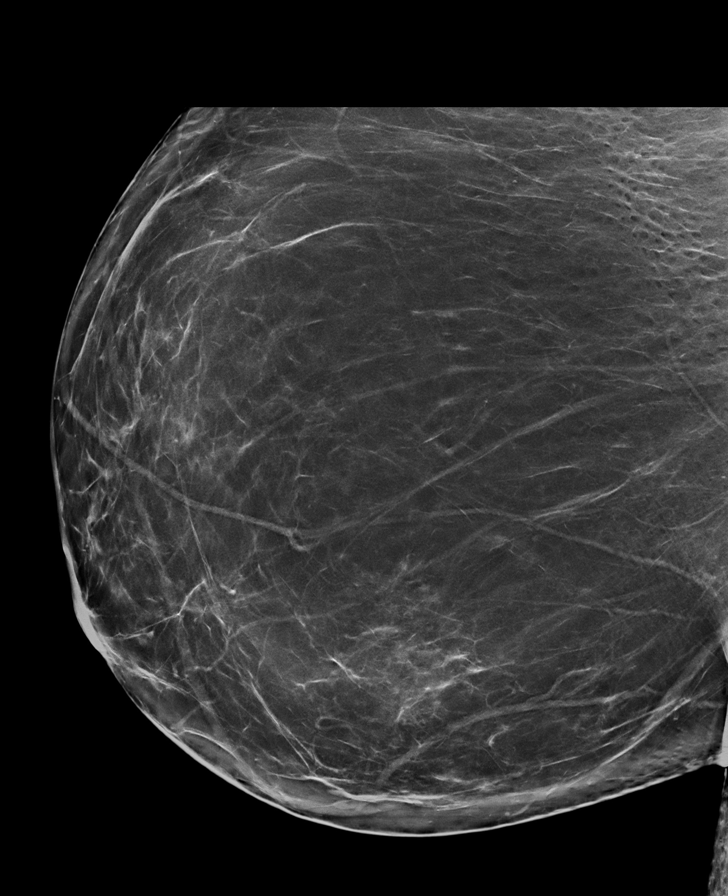

[R MLO tomo · tomo slice 49/98.0]
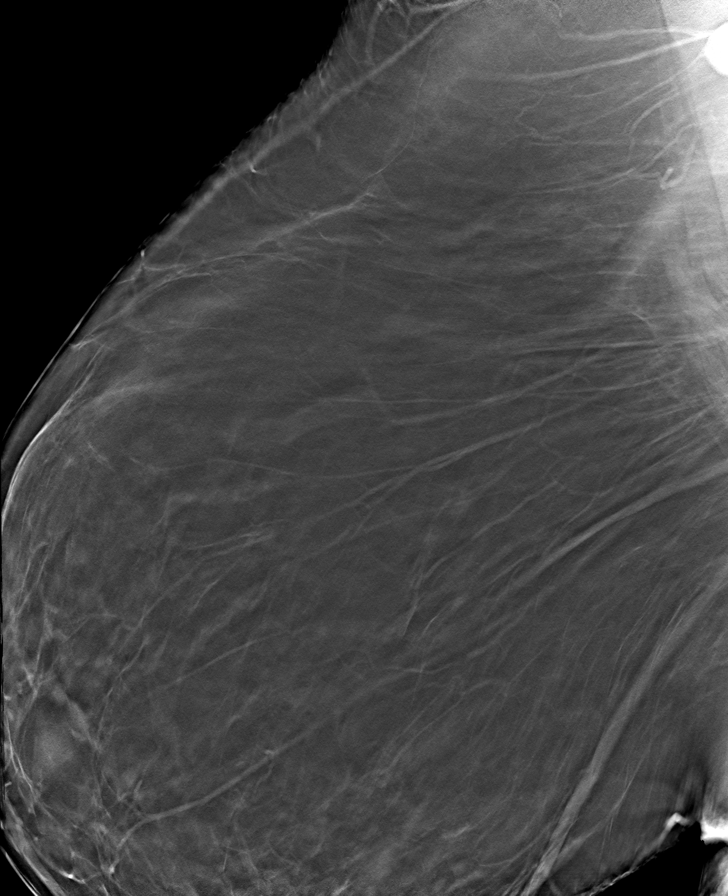

[8 of 40 positions shown; findings below may reference images not displayed]

ACR Breast Density Category b: There are scattered areas of
fibroglandular density.
FINDINGS: There are no findings suspicious for malignancy.
IMPRESSION: No mammographic evidence of malignancy. A result letter of this
screening mammogram will be mailed directly to the patient.

RECOMMENDATION:
Screening mammogram in one year. (Code:51-O-LD2)

BI-RADS CATEGORY  1: Negative.

## 2023-03-23 ENCOUNTER — Telehealth: Payer: Self-pay

## 2023-03-23 NOTE — Telephone Encounter (Signed)
Phoned the pt and was advised by the pt that she has only had a little BM since taking 2 stool softeners and one dulcolax. The pt states she doesn't drink much water at, she drinks a lot of caffeine sodas to help keep her awake due to her medications making her sleepy. She stated that the Dulcolax bottle states that she can take 3. I advised the pt to first start increase with water starting today and at least try to get in a few large cups. She will call back at the end of the week with an update to how it is doing. Can she increase the Dulcolax?

## 2023-03-25 NOTE — Telephone Encounter (Signed)
Agree with increasing water consumption.  She can take Dulcolax twice daily.  She can take MiraLAX 2 times daily as well on top of this.

## 2023-03-26 ENCOUNTER — Ambulatory Visit: Payer: BC Managed Care – PPO | Admitting: Internal Medicine

## 2023-03-26 NOTE — Telephone Encounter (Signed)
Phoned and vm was full

## 2023-03-26 NOTE — Telephone Encounter (Signed)
Phoned and spoke with the pt and advised of the recommendations. Pt expressed understanding of this. Pt has started to increase her water intake since we last spoke and she is having more BM's now

## 2023-04-09 ENCOUNTER — Ambulatory Visit (HOSPITAL_COMMUNITY): Payer: BC Managed Care – PPO | Admitting: Psychiatry

## 2023-04-23 ENCOUNTER — Ambulatory Visit: Payer: BC Managed Care – PPO | Admitting: Internal Medicine

## 2023-04-23 ENCOUNTER — Encounter: Payer: Self-pay | Admitting: Internal Medicine

## 2023-04-23 ENCOUNTER — Ambulatory Visit (INDEPENDENT_AMBULATORY_CARE_PROVIDER_SITE_OTHER): Payer: BC Managed Care – PPO | Admitting: Psychiatry

## 2023-04-23 VITALS — BP 168/87 | HR 96 | Temp 97.6°F | Ht 65.0 in | Wt >= 6400 oz

## 2023-04-23 DIAGNOSIS — K648 Other hemorrhoids: Secondary | ICD-10-CM

## 2023-04-23 DIAGNOSIS — F32A Depression, unspecified: Secondary | ICD-10-CM

## 2023-04-23 DIAGNOSIS — K625 Hemorrhage of anus and rectum: Secondary | ICD-10-CM

## 2023-04-23 DIAGNOSIS — F411 Generalized anxiety disorder: Secondary | ICD-10-CM | POA: Diagnosis not present

## 2023-04-23 DIAGNOSIS — K5904 Chronic idiopathic constipation: Secondary | ICD-10-CM | POA: Diagnosis not present
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
## 2023-04-23 NOTE — Progress Notes (Signed)
Primary Care Physician:  Marylynn Pearson, FNP Primary Gastroenterologist:  Dr. Marletta Lor  Chief Complaint  Patient presents with   Follow-up    Follow up on constipation. Pt is doing a little better   New Patient (Initial Visit)    HPI:   [name] is a 47 y.o. female who presents to clinic today for follow up visit.  Previously seen for rectal discomfort and constipation.  Started on bowel regimen with over-the-counter stool softeners.  States this is much improved.  Uses a combination of Dulcolax and stool softeners.  Also with rectal discomfort on previous visit likely due to anal fissure.  Was given topical nitroglycerin and states this is much improved as well.  Was having some intermittent rectal bleeding previously diagnosed with internal hemorrhoids.  States her bleeding has resolved.  Patient has significant morbid obesity, weighing 560 pounds today, BMI 93.  Past Medical History:  Diagnosis Date   Allergy    Anxiety    Arthritis    Asthma    Breast nodule 01/02/2015   Depression    Fatigue    GERD (gastroesophageal reflux disease)    Herpes    HPV in female    IBS (irritable bowel syndrome)    Obesity    OSA (obstructive sleep apnea)    PMDD (premenstrual dysphoric disorder)    Thyroid disease    hypothryoidism   Trauma    Vaginal Pap smear, abnormal    Vitamin D deficiency     Past Surgical History:  Procedure Laterality Date   CESAREAN SECTION     CHOLECYSTECTOMY     TONSILECTOMY/ADENOIDECTOMY WITH MYRINGOTOMY     WISDOM TOOTH EXTRACTION      Current Outpatient Medications  Medication Sig Dispense Refill   ALPRAZolam (XANAX) 0.5 MG tablet Take 1 tablet (0.5 mg total) by mouth at bedtime as needed for anxiety or sleep. 30 tablet 5   atorvastatin (LIPITOR) 20 MG tablet Take 20 mg by mouth daily.     busPIRone (BUSPAR) 10 MG tablet Take 2 tablets (20 mg total) by mouth 2 (two) times daily. 360 tablet 1   cetirizine (ZYRTEC) 10 MG tablet Take  10 mg by mouth daily.     Cholecalciferol (VITAMIN D3 PO) Take 1 tablet by mouth daily in the afternoon.     Cyanocobalamin (VITAMIN B12 PO) Take by mouth. One daily     Docusate Sodium (COLACE PO) Take 2 tablets by mouth in the morning and at bedtime.     levalbuterol (XOPENEX HFA) 45 MCG/ACT inhaler Inhale into the lungs every 4 (four) hours as needed for wheezing.     levothyroxine (SYNTHROID) 200 MCG tablet Take 1 tablet (200 mcg total) by mouth daily. 90 tablet 1   levothyroxine (SYNTHROID) 75 MCG tablet Take 1 tablet (75 mcg total) by mouth daily before breakfast. 90 tablet 1   Misc Natural Products (CRANBERRY/PROBIOTIC PO) Take by mouth daily. Cranberry/Probiotic/Prebiotic Blend-2 daily     montelukast (SINGULAIR) 10 MG tablet Take 10 mg by mouth daily.      Multiple Vitamins-Minerals (WOMENS MULTI VITAMIN & MINERAL PO) Take by mouth daily.     NONFORMULARY OR COMPOUNDED ITEM [facility] hemorrhoid cream.     norethindrone (AYGESTIN) 5 MG tablet TAKE 2 TABLETS BY MOUTH DAILY 60 tablet 3   propranolol (INDERAL) 20 MG tablet Take 1 tablet (20 mg total) by mouth daily as needed (anxiety). 90 tablet 1   venlafaxine XR (EFFEXOR-XR) 75 MG 24 hr  capsule Take 3 capsules (225 mg total) by mouth daily with breakfast. 270 capsule 1   ziprasidone (GEODON) 60 MG capsule Take 1 capsule (60 mg total) by mouth 2 (two) times daily with a meal. 180 capsule 1   No current facility-administered medications for this visit.    Allergies as of 04/23/2023 - Review Complete 04/23/2023  Allergen Reaction Noted   Latex  11/01/2009   Naproxen     Shrimp [shellfish allergy] Other (See Comments) 05/12/2017    Family History  Problem Relation Age of Onset   Hypertension Mother    Arthritis Mother    Anxiety disorder Mother    Lung cancer Mother    Sleep apnea Mother    Asthma Father    COPD Father    Arthritis Father    Chronic bronchitis Father    Hypertension Father    Hyperlipidemia Father     Anxiety disorder Father    Depression Father    Alcohol abuse Father    Drug abuse Brother    Alcohol abuse Brother    Hypertension Brother    Mental illness Brother    Depression Brother    Hyperlipidemia Brother    Hearing loss Brother    Asthma Brother    Learning disabilities Brother    Asthma Maternal Grandmother    Arthritis Maternal Grandmother    Hypertension Maternal Grandmother    Congestive Heart Failure Maternal Grandmother    Diabetes Maternal Grandfather    Stroke Maternal Grandfather    ADD / ADHD Son    Cancer Maternal Aunt    Cancer Maternal Uncle    Sleep apnea Maternal Uncle    Cancer Paternal Aunt    Cancer Paternal Uncle     Social History   Socioeconomic History   Marital status: Married    Spouse name: Not on file   Number of children: 2   Years of education: Not on file   Highest education level: Associate degree: occupational, Scientist, product/process development, or vocational program  Occupational History   Not on file  Tobacco Use   Smoking status: Former    Passive exposure: Past   Smokeless tobacco: Never   Tobacco comments:    Smoked "on occasion around friends" in high school "because it looked cool"  Vaping Use   Vaping status: Never Used  Substance and Sexual Activity   Alcohol use: Yes    Comment: occ   Drug use: No   Sexual activity: Yes    Birth control/protection: Pill    Comment: takes norethindrone acetate for heavy menses  Other Topics Concern   Not on file  Social History Narrative   Social Hx:   Current living situation- living in [place] with husband, son is 50/50 custody with biological father, daughter lives alone. Patient's mother lives with her.    Raised in  by mom and dad   Siblings- 2 brothers and pt is the youngest. She is 40 yrs younger than her youngest brother   Schooling- associates degree in early childhood   Married- yes - currently in 3rd marriage   Kids- 2 from 1st marriage      Legal issues- denies       Caffeine: all day long everyday, unable to quantify    Social Determinants of Health   Financial Resource Strain: Not on file  Food Insecurity: Not on file  Transportation Needs: Not on file  Physical Activity: Not on file  Stress: Not on file  Social Connections: Not  on file  Intimate Partner Violence: Not At Risk (11/22/2021)   [facility]   Humiliation, Afraid, Rape, and Kick questionnaire    Fear of Current or Ex-Partner: No    Emotionally Abused: No    Physically Abused: No    Sexually Abused: No    Subjective: Review of Systems  Constitutional:  Negative for chills and fever.  HENT:  Negative for congestion and hearing loss.   Eyes:  Negative for blurred vision and double vision.  Respiratory:  Negative for cough and shortness of breath.   Cardiovascular:  Negative for chest pain and palpitations.  Gastrointestinal:  Positive for constipation. Negative for abdominal pain, diarrhea, heartburn, melena and vomiting.  Genitourinary:  Negative for dysuria and urgency.  Musculoskeletal:  Negative for joint pain and myalgias.  Skin:  Negative for itching and rash.  Neurological:  Negative for dizziness and headaches.  Psychiatric/Behavioral:  Negative for depression. The patient is not nervous/anxious.        Objective: BP (!) 168/87   Pulse 96   Temp 97.6 F (36.4 C)   Ht 5\' 5"  (1.651 m)   Wt (!) 563 lb 12.8 oz (255.7 kg)   BMI 93.82 kg/m  Physical Exam Constitutional:      Appearance: Normal appearance. She is obese.  HENT:     Head: Normocephalic and atraumatic.  Eyes:     Extraocular Movements: Extraocular movements intact.     Conjunctiva/sclera: Conjunctivae normal.  Cardiovascular:     Rate and Rhythm: Normal rate and regular rhythm.  Pulmonary:     Effort: Pulmonary effort is normal.     Breath sounds: Normal breath sounds.  Abdominal:     General: Bowel sounds are normal.     Palpations: Abdomen is soft.   Musculoskeletal:        General: No swelling. Normal range of motion.     Cervical back: Normal range of motion and neck supple.  Skin:    General: Skin is warm and dry.     Coloration: Skin is not jaundiced.  Neurological:     General: No focal deficit present.     Mental Status: She is alert and oriented to person, place, and time.  Psychiatric:        Mood and Affect: Mood normal.        Behavior: Behavior normal.      Assessment: *Chronic constipation *Internal hemorrhoids *Rectal pain  Plan: Patient clinically doing much better today.  Constipation improved.  Rectal discomfort improved.  Bleeding has resolved.  Continue bowel regimen.  Keep bowel movements soft.  Continue nitroglycerin cream for another 4 to 6 weeks.  Patient will need colonoscopy at age 63 for screening purposes.  This will need to be done in [place] given her BMI and she understands.  Follow-up in 6 months or sooner if needed.  04/23/2023 3:35 PM   Disclaimer: This note was dictated with voice recognition software. Similar sounding words can inadvertently be transcribed and may not be corrected upon review.

## 2023-04-23 NOTE — Patient Instructions (Signed)
I am happy to hear that you are doing much better.  Continue stool softeners and Dulcolax.  Continue topical nitroglycerin best that you can.  You will need colonoscopy for colon cancer screening purposes next year.  We can discuss on follow-up visit in 6 months.  It was very nice seeing you again today.  Dr. Marletta Lor

## 2023-05-07 ENCOUNTER — Ambulatory Visit (INDEPENDENT_AMBULATORY_CARE_PROVIDER_SITE_OTHER): Payer: BC Managed Care – PPO | Admitting: Psychiatry

## 2023-05-07 DIAGNOSIS — F411 Generalized anxiety disorder: Secondary | ICD-10-CM
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
## 2023-05-13 ENCOUNTER — Telehealth: Payer: Self-pay | Admitting: Family Medicine

## 2023-05-13 NOTE — Telephone Encounter (Signed)
Pt called in and stated that she spoke to [place] and she stated that she owed them money on cpap supplies so she decided to make payment but not pay completely off she cannot afford to

## 2023-05-13 NOTE — Telephone Encounter (Signed)
Called patient and she has not seen PCP yet, her PCP appointment on September she will discuss referrals at that visit.   She has not reach out to DME [facility]) states she owes money on her cpap machine still and nervous to call. I asked her to just reach out to them and see what they say about her supply order at least. Pt verbalized she would.

## 2023-05-13 NOTE — Telephone Encounter (Signed)
Can you guys check in with patient to see if she was able to talk to her PCP regarding a referral to endocrinology and or weight management? Was she able to get new mask through DME?

## 2023-05-13 NOTE — Telephone Encounter (Signed)
Unable to Lmtcb 1st attempt

## 2023-06-09 ENCOUNTER — Ambulatory Visit (HOSPITAL_COMMUNITY): Payer: BC Managed Care – PPO | Admitting: Psychiatry

## 2023-07-16 ENCOUNTER — Telehealth (HOSPITAL_COMMUNITY): Payer: Self-pay

## 2023-07-16 NOTE — Telephone Encounter (Signed)
Called pt to confirm 07/20/23 appt no answer vm full

## 2023-07-17 NOTE — Telephone Encounter (Signed)
Called to confirm vm full appt confirmed via automated text

## 2023-07-20 ENCOUNTER — Ambulatory Visit (INDEPENDENT_AMBULATORY_CARE_PROVIDER_SITE_OTHER): Payer: BC Managed Care – PPO | Admitting: Psychiatry

## 2023-07-20 ENCOUNTER — Encounter (HOSPITAL_COMMUNITY): Payer: Self-pay | Admitting: Psychiatry

## 2023-07-20 DIAGNOSIS — F411 Generalized anxiety disorder: Secondary | ICD-10-CM | POA: Diagnosis not present

## 2023-07-20 DIAGNOSIS — F331 Major depressive disorder, recurrent, moderate: Secondary | ICD-10-CM | POA: Diagnosis not present

## 2023-07-20 DIAGNOSIS — F1590 Other stimulant use, unspecified, uncomplicated: Secondary | ICD-10-CM | POA: Diagnosis not present

## 2023-07-20 DIAGNOSIS — F4310 Post-traumatic stress disorder, unspecified: Secondary | ICD-10-CM | POA: Diagnosis not present

## 2023-07-20 DIAGNOSIS — Z789 Other specified health status: Secondary | ICD-10-CM

## 2023-07-20 DIAGNOSIS — Z79899 Other long term (current) drug therapy: Secondary | ICD-10-CM

## 2023-07-20 DIAGNOSIS — Z6981 Encounter for mental health services for victim of other abuse: Secondary | ICD-10-CM

## 2023-07-20 HISTORY — DX: Other long term (current) drug therapy: Z79.899

## 2023-07-20 MED ORDER — PROPRANOLOL HCL 20 MG PO TABS: 20.0000 mg | ORAL_TABLET | Freq: Every day | ORAL | 0 refills | Status: DC | PRN

## 2023-07-20 MED ORDER — VENLAFAXINE HCL ER 75 MG PO CP24: 225.0000 mg | ORAL_CAPSULE | Freq: Every day | ORAL | 0 refills | Status: DC

## 2023-07-20 MED ORDER — BUSPIRONE HCL 10 MG PO TABS: 20.0000 mg | ORAL_TABLET | Freq: Two times a day (BID) | ORAL | 0 refills | Status: DC

## 2023-07-20 MED ORDER — ZIPRASIDONE HCL 60 MG PO CAPS: 60.0000 mg | ORAL_CAPSULE | Freq: Two times a day (BID) | ORAL | 0 refills | Status: DC
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
## 2023-07-28 ENCOUNTER — Inpatient Hospital Stay (HOSPITAL_COMMUNITY)
Admission: RE | Admit: 2023-07-28 | Discharge: 2023-07-28 | Disposition: A | Discharge status: Home / Self Care | Payer: BC Managed Care – PPO | Source: Ambulatory Visit | Attending: Family Medicine | Admitting: Family Medicine

## 2023-07-28 ENCOUNTER — Encounter (HOSPITAL_COMMUNITY): Payer: BC Managed Care – PPO

## 2023-07-29 ENCOUNTER — Ambulatory Visit (INDEPENDENT_AMBULATORY_CARE_PROVIDER_SITE_OTHER): Payer: BC Managed Care – PPO | Admitting: Psychiatry

## 2023-07-29 DIAGNOSIS — F411 Generalized anxiety disorder: Secondary | ICD-10-CM

## 2023-07-29 DIAGNOSIS — F329 Major depressive disorder, single episode, unspecified: Secondary | ICD-10-CM
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
## 2023-08-12 ENCOUNTER — Ambulatory Visit (HOSPITAL_COMMUNITY): Payer: BC Managed Care – PPO | Admitting: Psychiatry

## 2023-08-17 ENCOUNTER — Encounter (HOSPITAL_COMMUNITY): Payer: Self-pay | Admitting: Psychiatry

## 2023-08-17 ENCOUNTER — Telehealth (HOSPITAL_COMMUNITY): Payer: BC Managed Care – PPO | Admitting: Psychiatry

## 2023-08-17 DIAGNOSIS — F410 Panic disorder [episodic paroxysmal anxiety] without agoraphobia: Secondary | ICD-10-CM

## 2023-08-17 DIAGNOSIS — F15982 Other stimulant use, unspecified with stimulant-induced sleep disorder: Secondary | ICD-10-CM | POA: Diagnosis not present

## 2023-08-17 DIAGNOSIS — F331 Major depressive disorder, recurrent, moderate: Secondary | ICD-10-CM | POA: Diagnosis not present

## 2023-08-17 DIAGNOSIS — G4733 Obstructive sleep apnea (adult) (pediatric): Secondary | ICD-10-CM

## 2023-08-17 DIAGNOSIS — F4310 Post-traumatic stress disorder, unspecified: Secondary | ICD-10-CM

## 2023-08-17 DIAGNOSIS — Z789 Other specified health status: Secondary | ICD-10-CM

## 2023-08-17 DIAGNOSIS — Z79899 Other long term (current) drug therapy: Secondary | ICD-10-CM

## 2023-08-17 DIAGNOSIS — F411 Generalized anxiety disorder: Secondary | ICD-10-CM

## 2023-08-17 MED ORDER — TRAZODONE HCL 50 MG PO TABS: 25.0000 mg | ORAL_TABLET | Freq: Every day | ORAL | 1 refills | Status: DC

## 2023-08-17 MED ORDER — ZIPRASIDONE HCL 20 MG PO CAPS: ORAL_CAPSULE | ORAL | 0 refills | Status: DC
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
## 2023-08-26 ENCOUNTER — Ambulatory Visit (HOSPITAL_COMMUNITY): Payer: BC Managed Care – PPO | Admitting: Psychiatry

## 2023-08-26 DIAGNOSIS — F331 Major depressive disorder, recurrent, moderate: Secondary | ICD-10-CM | POA: Diagnosis not present

## 2023-08-26 DIAGNOSIS — F411 Generalized anxiety disorder: Secondary | ICD-10-CM | POA: Diagnosis not present
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
## 2023-09-01 ENCOUNTER — Other Ambulatory Visit (HOSPITAL_COMMUNITY): Payer: Self-pay | Admitting: Family Medicine

## 2023-09-01 DIAGNOSIS — N63 Unspecified lump in unspecified breast: Secondary | ICD-10-CM

## 2023-09-08 ENCOUNTER — Ambulatory Visit (HOSPITAL_COMMUNITY)
Admission: RE | Admit: 2023-09-08 | Discharge: 2023-09-08 | Disposition: A | Discharge status: Home / Self Care | Payer: BC Managed Care – PPO | Source: Ambulatory Visit | Attending: Family Medicine | Admitting: Family Medicine

## 2023-09-08 ENCOUNTER — Encounter (HOSPITAL_COMMUNITY): Payer: Self-pay

## 2023-09-08 DIAGNOSIS — N63 Unspecified lump in unspecified breast: Secondary | ICD-10-CM | POA: Insufficient documentation

## 2023-09-14 ENCOUNTER — Telehealth (HOSPITAL_COMMUNITY): Payer: BC Managed Care – PPO | Admitting: Psychiatry

## 2023-09-14 ENCOUNTER — Encounter (HOSPITAL_COMMUNITY): Payer: Self-pay | Admitting: Psychiatry

## 2023-09-14 DIAGNOSIS — F410 Panic disorder [episodic paroxysmal anxiety] without agoraphobia: Secondary | ICD-10-CM

## 2023-09-14 DIAGNOSIS — F331 Major depressive disorder, recurrent, moderate: Secondary | ICD-10-CM

## 2023-09-14 DIAGNOSIS — F4310 Post-traumatic stress disorder, unspecified: Secondary | ICD-10-CM | POA: Diagnosis not present

## 2023-09-14 DIAGNOSIS — F15982 Other stimulant use, unspecified with stimulant-induced sleep disorder: Secondary | ICD-10-CM

## 2023-09-14 DIAGNOSIS — Z79899 Other long term (current) drug therapy: Secondary | ICD-10-CM

## 2023-09-14 DIAGNOSIS — F411 Generalized anxiety disorder: Secondary | ICD-10-CM | POA: Diagnosis not present

## 2023-09-14 DIAGNOSIS — Z789 Other specified health status: Secondary | ICD-10-CM
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
## 2023-09-16 DIAGNOSIS — Z79899 Other long term (current) drug therapy: Secondary | ICD-10-CM | POA: Insufficient documentation

## 2023-09-16 NOTE — Addendum Note (Signed)
Addended by: Tia Masker on: 09/16/2023 08:51 AM   Modules accepted: Orders

## 2023-09-21 ENCOUNTER — Telehealth (HOSPITAL_COMMUNITY): Payer: Self-pay | Admitting: *Deleted

## 2023-09-21 NOTE — Telephone Encounter (Signed)
Patient is aware and verbalized understanding.

## 2023-09-21 NOTE — Telephone Encounter (Signed)
Patient called stating she was wondering if she needs to fast for her CMP, CBC, ASTALT. Per pt she would like for provider to please let her know.

## 2023-09-23 LAB — COMPREHENSIVE METABOLIC PANEL
AG Ratio: 1.3 (calc) (ref 1.0–2.5)
ALT: 24 U/L (ref 6–29)
AST: 17 U/L (ref 10–30)
Albumin: 4.4 g/dL (ref 3.6–5.1)
Alkaline phosphatase (APISO): 61 U/L (ref 31–125)
BUN: 13 mg/dL (ref 7–25)
CO2: 26 mmol/L (ref 20–32)
Calcium: 9.7 mg/dL (ref 8.6–10.2)
Chloride: 104 mmol/L (ref 98–110)
Creat: 0.69 mg/dL (ref 0.50–0.99)
Globulin: 3.3 g/dL (ref 1.9–3.7)
Glucose, Bld: 96 mg/dL (ref 65–139)
Potassium: 4.6 mmol/L (ref 3.5–5.3)
Sodium: 138 mmol/L (ref 135–146)
Total Bilirubin: 0.4 mg/dL (ref 0.2–1.2)
Total Protein: 7.7 g/dL (ref 6.1–8.1)

## 2023-09-23 LAB — CBC
HCT: 48.7 % — ABNORMAL HIGH (ref 35.0–45.0)
Hemoglobin: 15.9 g/dL — ABNORMAL HIGH (ref 11.7–15.5)
MCH: 30.5 pg (ref 27.0–33.0)
MCHC: 32.6 g/dL (ref 32.0–36.0)
MCV: 93.5 fL (ref 80.0–100.0)
MPV: 9.6 fL (ref 7.5–12.5)
Platelets: 397 10*3/uL (ref 140–400)
RBC: 5.21 10*6/uL — ABNORMAL HIGH (ref 3.80–5.10)
RDW: 12.8 % (ref 11.0–15.0)
WBC: 6.8 10*3/uL (ref 3.8–10.8)

## 2023-09-28 ENCOUNTER — Telehealth (HOSPITAL_COMMUNITY): Payer: Self-pay | Admitting: *Deleted

## 2023-09-28 ENCOUNTER — Telehealth (HOSPITAL_COMMUNITY): Payer: Self-pay | Admitting: Psychiatry

## 2023-09-28 DIAGNOSIS — R454 Irritability and anger: Secondary | ICD-10-CM

## 2023-09-28 DIAGNOSIS — F331 Major depressive disorder, recurrent, moderate: Secondary | ICD-10-CM

## 2023-09-28 MED ORDER — LAMOTRIGINE 25 MG PO TABS: ORAL_TABLET | ORAL | 0 refills | Status: DC

## 2023-09-28 NOTE — Telephone Encounter (Signed)
Patient called stating she would like to know her lab results and would like to know when she will be starting the new meds.

## 2023-09-28 NOTE — Telephone Encounter (Signed)
Blood work resulted, safe for start of lamotrigine 25mg  nightly for 2 weeks then increase to 50mg  nightly thereafter.

## 2023-09-30 ENCOUNTER — Encounter: Payer: Self-pay | Admitting: Internal Medicine

## 2023-10-16 ENCOUNTER — Telehealth (HOSPITAL_COMMUNITY): Payer: BC Managed Care – PPO | Admitting: Psychiatry

## 2023-10-16 ENCOUNTER — Encounter (HOSPITAL_COMMUNITY): Payer: Self-pay | Admitting: Psychiatry

## 2023-10-16 DIAGNOSIS — F15982 Other stimulant use, unspecified with stimulant-induced sleep disorder: Secondary | ICD-10-CM | POA: Diagnosis not present

## 2023-10-16 DIAGNOSIS — Z789 Other specified health status: Secondary | ICD-10-CM

## 2023-10-16 DIAGNOSIS — F4310 Post-traumatic stress disorder, unspecified: Secondary | ICD-10-CM | POA: Diagnosis not present

## 2023-10-16 DIAGNOSIS — F331 Major depressive disorder, recurrent, moderate: Secondary | ICD-10-CM | POA: Diagnosis not present

## 2023-10-16 DIAGNOSIS — F411 Generalized anxiety disorder: Secondary | ICD-10-CM

## 2023-10-16 DIAGNOSIS — F410 Panic disorder [episodic paroxysmal anxiety] without agoraphobia: Secondary | ICD-10-CM

## 2023-10-16 MED ORDER — LAMOTRIGINE 100 MG PO TABS: 100.0000 mg | ORAL_TABLET | Freq: Every evening | ORAL | 1 refills | Status: DC

## 2023-10-16 MED ORDER — PROPRANOLOL HCL 20 MG PO TABS: 20.0000 mg | ORAL_TABLET | Freq: Every day | ORAL | 0 refills | Status: DC | PRN

## 2023-10-16 MED ORDER — VENLAFAXINE HCL ER 75 MG PO CP24: 225.0000 mg | ORAL_CAPSULE | Freq: Every day | ORAL | 0 refills | Status: DC

## 2023-10-16 MED ORDER — TRAZODONE HCL 50 MG PO TABS: 25.0000 mg | ORAL_TABLET | Freq: Every day | ORAL | 1 refills | Status: DC
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
## 2023-10-16 NOTE — Patient Instructions (Signed)
 We will plan on increasing the lamotrigine to 100 mg nightly on 10/27/2023.  Keep up the good work with cutting back on caffeine.

## 2023-10-26 ENCOUNTER — Encounter (HOSPITAL_COMMUNITY): Payer: Self-pay

## 2023-10-28 ENCOUNTER — Ambulatory Visit (INDEPENDENT_AMBULATORY_CARE_PROVIDER_SITE_OTHER): Payer: BC Managed Care – PPO | Admitting: Psychiatry

## 2023-10-28 DIAGNOSIS — F410 Panic disorder [episodic paroxysmal anxiety] without agoraphobia: Secondary | ICD-10-CM

## 2023-10-28 DIAGNOSIS — F4310 Post-traumatic stress disorder, unspecified: Secondary | ICD-10-CM | POA: Diagnosis not present

## 2023-10-28 DIAGNOSIS — F331 Major depressive disorder, recurrent, moderate: Secondary | ICD-10-CM | POA: Diagnosis not present

## 2023-10-28 DIAGNOSIS — F411 Generalized anxiety disorder: Secondary | ICD-10-CM
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
## 2023-10-30 LAB — LAB REPORT - SCANNED
A1c: 5.8
EGFR: 109

## 2023-11-02 ENCOUNTER — Other Ambulatory Visit (HOSPITAL_COMMUNITY): Payer: Self-pay | Admitting: Psychiatry

## 2023-11-02 DIAGNOSIS — R454 Irritability and anger: Secondary | ICD-10-CM

## 2023-11-02 DIAGNOSIS — F331 Major depressive disorder, recurrent, moderate: Secondary | ICD-10-CM

## 2023-11-11 ENCOUNTER — Ambulatory Visit (INDEPENDENT_AMBULATORY_CARE_PROVIDER_SITE_OTHER): Payer: BC Managed Care – PPO | Admitting: Psychiatry

## 2023-11-11 DIAGNOSIS — F4310 Post-traumatic stress disorder, unspecified: Secondary | ICD-10-CM | POA: Diagnosis not present

## 2023-11-11 DIAGNOSIS — F331 Major depressive disorder, recurrent, moderate: Secondary | ICD-10-CM | POA: Diagnosis not present
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
## 2023-11-19 ENCOUNTER — Telehealth (HOSPITAL_COMMUNITY): Payer: BC Managed Care – PPO | Admitting: Psychiatry

## 2023-11-24 ENCOUNTER — Telehealth (INDEPENDENT_AMBULATORY_CARE_PROVIDER_SITE_OTHER): Payer: BC Managed Care – PPO | Admitting: Psychiatry

## 2023-11-24 ENCOUNTER — Encounter (HOSPITAL_COMMUNITY): Payer: Self-pay | Admitting: Psychiatry

## 2023-11-24 DIAGNOSIS — F411 Generalized anxiety disorder: Secondary | ICD-10-CM

## 2023-11-24 DIAGNOSIS — F410 Panic disorder [episodic paroxysmal anxiety] without agoraphobia: Secondary | ICD-10-CM

## 2023-11-24 DIAGNOSIS — F4310 Post-traumatic stress disorder, unspecified: Secondary | ICD-10-CM

## 2023-11-24 DIAGNOSIS — G4733 Obstructive sleep apnea (adult) (pediatric): Secondary | ICD-10-CM

## 2023-11-24 DIAGNOSIS — Z789 Other specified health status: Secondary | ICD-10-CM

## 2023-11-24 DIAGNOSIS — F15982 Other stimulant use, unspecified with stimulant-induced sleep disorder: Secondary | ICD-10-CM

## 2023-11-24 DIAGNOSIS — F331 Major depressive disorder, recurrent, moderate: Secondary | ICD-10-CM

## 2023-11-24 DIAGNOSIS — E559 Vitamin D deficiency, unspecified: Secondary | ICD-10-CM

## 2023-11-24 MED ORDER — LAMOTRIGINE 100 MG PO TABS: 100.0000 mg | ORAL_TABLET | Freq: Every evening | ORAL | 0 refills | Status: DC

## 2023-11-24 MED ORDER — TRAZODONE HCL 50 MG PO TABS
50.0000 mg | ORAL_TABLET | Freq: Every day | ORAL | 0 refills | Status: DC
Start: 2023-11-24 — End: 2024-01-28

## 2023-11-24 NOTE — Patient Instructions (Signed)
We increased the trazodone to 50 mg nightly today.  We otherwise kept your Effexor XR and Lamictal the same.  Keep trying to cut back on the caffeine and that should make the sleep more consistent at night and continue to improve your blood pressure and anxiety.
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
## 2023-12-17 DIAGNOSIS — E063 Autoimmune thyroiditis: Secondary | ICD-10-CM | POA: Insufficient documentation

## 2023-12-29 ENCOUNTER — Ambulatory Visit (INDEPENDENT_AMBULATORY_CARE_PROVIDER_SITE_OTHER): Payer: BC Managed Care – PPO | Admitting: Psychiatry

## 2023-12-29 DIAGNOSIS — F331 Major depressive disorder, recurrent, moderate: Secondary | ICD-10-CM

## 2023-12-29 DIAGNOSIS — F329 Major depressive disorder, single episode, unspecified: Secondary | ICD-10-CM | POA: Diagnosis not present

## 2023-12-29 DIAGNOSIS — F411 Generalized anxiety disorder: Secondary | ICD-10-CM
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
## 2024-01-01 ENCOUNTER — Encounter: Payer: Self-pay | Admitting: Internal Medicine

## 2024-01-12 ENCOUNTER — Ambulatory Visit (INDEPENDENT_AMBULATORY_CARE_PROVIDER_SITE_OTHER): Payer: BC Managed Care – PPO | Admitting: Psychiatry

## 2024-01-12 DIAGNOSIS — F411 Generalized anxiety disorder: Secondary | ICD-10-CM | POA: Diagnosis not present

## 2024-01-12 DIAGNOSIS — F331 Major depressive disorder, recurrent, moderate: Secondary | ICD-10-CM

## 2024-01-12 DIAGNOSIS — F410 Panic disorder [episodic paroxysmal anxiety] without agoraphobia: Secondary | ICD-10-CM
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
## 2024-01-25 ENCOUNTER — Telehealth (HOSPITAL_COMMUNITY): Payer: BC Managed Care – PPO | Admitting: Psychiatry

## 2024-01-26 ENCOUNTER — Ambulatory Visit (INDEPENDENT_AMBULATORY_CARE_PROVIDER_SITE_OTHER): Payer: BC Managed Care – PPO | Admitting: Psychiatry

## 2024-01-26 DIAGNOSIS — F411 Generalized anxiety disorder: Secondary | ICD-10-CM | POA: Diagnosis not present

## 2024-01-26 DIAGNOSIS — F331 Major depressive disorder, recurrent, moderate: Secondary | ICD-10-CM | POA: Diagnosis not present

## 2024-01-26 DIAGNOSIS — F410 Panic disorder [episodic paroxysmal anxiety] without agoraphobia: Secondary | ICD-10-CM | POA: Diagnosis not present
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
## 2024-01-28 ENCOUNTER — Telehealth (HOSPITAL_COMMUNITY): Payer: Self-pay

## 2024-01-28 ENCOUNTER — Encounter (HOSPITAL_COMMUNITY): Payer: Self-pay | Admitting: Psychiatry

## 2024-01-28 ENCOUNTER — Telehealth (INDEPENDENT_AMBULATORY_CARE_PROVIDER_SITE_OTHER): Admitting: Psychiatry

## 2024-01-28 DIAGNOSIS — Z789 Other specified health status: Secondary | ICD-10-CM

## 2024-01-28 DIAGNOSIS — R41840 Attention and concentration deficit: Secondary | ICD-10-CM

## 2024-01-28 DIAGNOSIS — F410 Panic disorder [episodic paroxysmal anxiety] without agoraphobia: Secondary | ICD-10-CM

## 2024-01-28 DIAGNOSIS — N951 Menopausal and female climacteric states: Secondary | ICD-10-CM

## 2024-01-28 DIAGNOSIS — F15982 Other stimulant use, unspecified with stimulant-induced sleep disorder: Secondary | ICD-10-CM

## 2024-01-28 DIAGNOSIS — F411 Generalized anxiety disorder: Secondary | ICD-10-CM | POA: Diagnosis not present

## 2024-01-28 DIAGNOSIS — F330 Major depressive disorder, recurrent, mild: Secondary | ICD-10-CM | POA: Diagnosis not present

## 2024-01-28 DIAGNOSIS — F4310 Post-traumatic stress disorder, unspecified: Secondary | ICD-10-CM

## 2024-01-28 MED ORDER — TRAZODONE HCL 50 MG PO TABS: 50.0000 mg | ORAL_TABLET | Freq: Every day | ORAL | 1 refills | Status: AC

## 2024-01-28 MED ORDER — LAMOTRIGINE 100 MG PO TABS: 100.0000 mg | ORAL_TABLET | Freq: Every evening | ORAL | 1 refills | Status: AC

## 2024-01-28 MED ORDER — CLONIDINE HCL 0.1 MG PO TABS: 0.1000 mg | ORAL_TABLET | Freq: Every evening | ORAL | 1 refills | Status: AC

## 2024-01-28 MED ORDER — CLONIDINE HCL 0.1 MG PO TABS
0.1000 mg | ORAL_TABLET | Freq: Every evening | ORAL | 1 refills | Status: DC
Start: 2024-01-28 — End: 2024-01-28

## 2024-01-28 MED ORDER — VENLAFAXINE HCL ER 75 MG PO CP24: 225.0000 mg | ORAL_CAPSULE | Freq: Every day | ORAL | 1 refills | Status: AC

## 2024-01-28 MED ORDER — PROPRANOLOL HCL 20 MG PO TABS: 20.0000 mg | ORAL_TABLET | Freq: Every day | ORAL | 1 refills | Status: AC | PRN

## 2024-01-28 NOTE — Patient Instructions (Signed)
 We added clonidine 0.1 mg nightly to your regimen today which could help with attention but should help as well with the sweating and overheating from perimenopause.
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
## 2024-01-28 NOTE — Telephone Encounter (Addendum)
 Received a fax from [facility] on [address] stating that pt is requesting a 90 day supply of Clonidine 0.1MG  Tablets. Pt seen today. Please advise,  90d supply sent.

## 2024-02-22 ENCOUNTER — Encounter (HOSPITAL_COMMUNITY): Payer: Self-pay | Admitting: Psychiatry

## 2024-02-22 ENCOUNTER — Telehealth (INDEPENDENT_AMBULATORY_CARE_PROVIDER_SITE_OTHER): Admitting: Psychiatry

## 2024-02-22 DIAGNOSIS — F330 Major depressive disorder, recurrent, mild: Secondary | ICD-10-CM | POA: Diagnosis not present

## 2024-02-22 DIAGNOSIS — F411 Generalized anxiety disorder: Secondary | ICD-10-CM | POA: Diagnosis not present

## 2024-02-22 DIAGNOSIS — F410 Panic disorder [episodic paroxysmal anxiety] without agoraphobia: Secondary | ICD-10-CM

## 2024-02-22 DIAGNOSIS — F4310 Post-traumatic stress disorder, unspecified: Secondary | ICD-10-CM | POA: Diagnosis not present

## 2024-02-22 DIAGNOSIS — Z789 Other specified health status: Secondary | ICD-10-CM

## 2024-02-22 DIAGNOSIS — F15982 Other stimulant use, unspecified with stimulant-induced sleep disorder: Secondary | ICD-10-CM

## 2024-02-22 DIAGNOSIS — R41840 Attention and concentration deficit: Secondary | ICD-10-CM
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
## 2024-02-22 NOTE — Patient Instructions (Signed)
 We did not make any medication changes today.  Keep urine to cut back on caffeine where you are able.

## 2024-03-01 ENCOUNTER — Encounter: Payer: Self-pay | Admitting: Internal Medicine

## 2024-03-02 NOTE — Progress Notes (Deleted)
 [name]

## 2024-03-03 ENCOUNTER — Ambulatory Visit: Payer: BC Managed Care – PPO | Admitting: Family Medicine

## 2024-03-16 NOTE — Patient Instructions (Incomplete)
 Please continue using your CPAP regularly. While your insurance requires that you use CPAP at least 4 hours each night on 70% of the nights, I recommend, that you not skip any nights and use it throughout the night if you can. Getting used to CPAP and staying with the treatment long term does take time and patience and discipline. Untreated obstructive sleep apnea when it is moderate to severe can have an adverse impact on cardiovascular health and raise her risk for heart disease, arrhythmias, hypertension, congestive heart failure, stroke and diabetes. Untreated obstructive sleep apnea causes sleep disruption, nonrestorative sleep, and sleep deprivation. This can have an impact on your day to day functioning and cause daytime sleepiness and impairment of cognitive function, memory loss, mood disturbance, and problems focussing. Using CPAP regularly can improve these symptoms.  We will update supply orders, today. You can look into getting supplies online through [employer] or [email or web address]   I am so proud of you!!!! Keep up the good work!  Follow up in 1 year

## 2024-03-16 NOTE — Progress Notes (Unsigned)
 [name]

## 2024-03-16 NOTE — Progress Notes (Unsigned)
 PATIENT: [name] DOB: 08/10/1976  REASON FOR VISIT: follow up HISTORY FROM: patient  No chief complaint on file.    HISTORY OF PRESENT ILLNESS:  03/16/24 ALL:  [name] returns for follow up for OSA on CPAP.     03/04/2023 ALL:  [name] is a 48 y.o. female here today for follow up for OSA on CPAP.  She was last seen in follow up with Dr Omar Bibber 02/2022. ONO 03/2022 did not reveal need for O2. She continues to do well on therapy. She is using CPAP every night for about 8-9 hours. She continues to have a leak. She reports using same nasal pillow mask since starting therapy over a year ago. She does report owing DME money. She continues to feel tired and has daytime sleepiness. She is trying to find an endocrinologist that will see her. She is limited on transportation. She is considering seeing weight management. She continues regular follow up with her PCP. She is seeing psychology and psychiatry regularly.      HISTORY: (copied from Dr Dail Drought previous note)  [name] is a 48 year old right-handed woman with an underlying medical history of allergies, arthritis, anxiety, depression, reflux disease, thyroid  disease, vitamin D deficiency, asthma, and morbid obesity with a BMI of over 80, who presents for follow-up consultation of her obstructive sleep apnea on AutoPap therapy.  The patient is unaccompanied today.  I first met her at the request of her primary care provider on 07/18/2021, at which time she reported snoring and excessive daytime somnolence as well as witnessed apneas.  I previous sleep study several years prior that showed mild sleep apnea.  She was advised to proceed with a sleep study.  She had a home sleep test on 08/26/2021, which indicated severe obstructive sleep apnea with an AHI of 87.3/h, O2 nadir 65% with significant time below or at 88% saturation of 263.7 minutes for the night.  She had variable snoring.  She was advised to proceed with AutoPap  therapy.  Her set up date was 09/06/2021.  She has a ResMed air sense 11 AutoSet machine.   Today, 03/03/2022: I reviewed her AutoPap compliance data from 02/01/2022 through 03/02/2022, which is a total of 30 days, during which time she used her machine every night with percent use days greater than 4 hours at 100%, indicating superb compliance with an average usage of 9 hours and 46 minutes, residual AHI at goal at 0.9/h, average pressure for the 95th percentile at 13.9 cm with a range of 6 to 15 cm with EPR.  Leak on the higher side with the 95th percentile at 25.8 L/min.  She uses nasal pillows with good success.  She is very pleased with her outcome thus far and she is compliant with treatment, is benefiting from it and that she feels less sleepy during the day, nocturia significantly improved, after initial adjustment she has done really well.  Sometimes she feels the air blowing harder than others.  She is able to tolerate the interface and the pressure thus far.  She has been working on weight loss and recently started a trial of Wegovy some 2 weeks ago.  She is hoping to be able to continue with it.  She tries to hydrate well.  She does not always drink enough water by self admission.  She sleeps with less restlessness.  Has been compliant since the beginning.  Her compliance data for the past 90 days also indicates full compliance.  REVIEW OF SYSTEMS: Out of a complete 14 system review of symptoms, the patient complains only of the following symptoms, fatigue, bilateral knee pain, anxiety, depression, and all other reviewed systems are negative.  ESS: 16/24  ALLERGIES: Allergies  Allergen Reactions   Latex    Naproxen    Shrimp [Shellfish Allergy] Other (See Comments)    Intolerance     HOME MEDICATIONS: Outpatient Medications Prior to Visit  Medication Sig Dispense Refill   atorvastatin (LIPITOR) 20 MG tablet Take 20 mg by mouth daily.     betamethasone dipropionate 0.05 % cream Apply  topically daily as needed (eczema).     BISACODYL LAXATIVE PO Take by mouth daily as needed (constipation).     cetirizine (ZYRTEC) 10 MG tablet Take 10 mg by mouth daily.     Cholecalciferol (VITAMIN D3 PO) Take 1 tablet by mouth daily in the afternoon.     cloNIDine  (CATAPRES ) 0.1 MG tablet Take 1 tablet (0.1 mg total) by mouth at bedtime. 90 tablet 1   Cyanocobalamin  (VITAMIN B12 PO) Take by mouth. One daily     Docusate Sodium (COLACE PO) Take 2 tablets by mouth in the morning and at bedtime.     lamoTRIgine  (LAMICTAL ) 100 MG tablet Take 1 tablet (100 mg total) by mouth at bedtime. 90 tablet 1   levalbuterol (XOPENEX HFA) 45 MCG/ACT inhaler Inhale into the lungs every 4 (four) hours as needed for wheezing.     levothyroxine  (SYNTHROID ) 200 MCG tablet Take 1 tablet (200 mcg total) by mouth daily. 90 tablet 1   levothyroxine  (SYNTHROID ) 75 MCG tablet Take 1 tablet (75 mcg total) by mouth daily before breakfast. 90 tablet 1   liothyronine (CYTOMEL) 5 MCG tablet Take 5 mcg by mouth 2 (two) times daily.     Misc Natural Products (CRANBERRY/PROBIOTIC PO) Take by mouth daily. Cranberry/Probiotic/Prebiotic Blend-2 daily     montelukast (SINGULAIR) 10 MG tablet Take 10 mg by mouth daily.      Multiple Vitamins-Minerals (WOMENS MULTI VITAMIN & MINERAL PO) Take by mouth daily.     NONFORMULARY OR COMPOUNDED ITEM [facility] hemorrhoid cream.     norethindrone  (AYGESTIN ) 5 MG tablet TAKE 2 TABLETS BY MOUTH DAILY 60 tablet 3   phentermine (ADIPEX-P) 37.5 MG tablet Take 37.5 mg by mouth daily before breakfast.     propranolol  (INDERAL ) 20 MG tablet Take 1 tablet (20 mg total) by mouth daily as needed (anxiety). 90 tablet 1   SUMAtriptan (IMITREX) 50 MG tablet Take 50 mg by mouth daily as needed for migraine.     traZODone  (DESYREL ) 50 MG tablet Take 1 tablet (50 mg total) by mouth at bedtime. 90 tablet 1   venlafaxine  XR (EFFEXOR -XR) 75 MG 24 hr capsule Take 3 capsules (225 mg total) by mouth daily  with breakfast. 270 capsule 1   No facility-administered medications prior to visit.    PAST MEDICAL HISTORY: Past Medical History:  Diagnosis Date   Allergy    Anxiety    Arthritis    Asthma    Breast nodule 01/02/2015   Depression    Fatigue    GERD (gastroesophageal reflux disease)    Herpes    HPV in female    IBS (irritable bowel syndrome)    Long term current use of antipsychotic medication 07/20/2023   Long-term current use ofprescription benzodiazepine 07/20/2023   Obesity    OSA (obstructive sleep apnea)    PMDD (premenstrual dysphoric disorder)    Thyroid  disease  hypothryoidism   Trauma    Vaginal Pap smear, abnormal    Vitamin D deficiency     PAST SURGICAL HISTORY: Past Surgical History:  Procedure Laterality Date   CESAREAN SECTION     CHOLECYSTECTOMY     TONSILECTOMY/ADENOIDECTOMY WITH MYRINGOTOMY     WISDOM TOOTH EXTRACTION      FAMILY HISTORY: Family History  Problem Relation Age of Onset   Hypertension Mother    Arthritis Mother    Anxiety disorder Mother    Lung cancer Mother    Sleep apnea Mother    Asthma Father    COPD Father    Arthritis Father    Chronic bronchitis Father    Hypertension Father    Hyperlipidemia Father    Anxiety disorder Father    Depression Father    Alcohol abuse Father    Drug abuse Brother    Alcohol abuse Brother    Hypertension Brother    Mental illness Brother    Depression Brother    Hyperlipidemia Brother    Hearing loss Brother    Asthma Brother    Learning disabilities Brother    Asthma Maternal Grandmother    Arthritis Maternal Grandmother    Hypertension Maternal Grandmother    Congestive Heart Failure Maternal Grandmother    Diabetes Maternal Grandfather    Stroke Maternal Grandfather    ADD / ADHD Son    Cancer Maternal Aunt    Cancer Maternal Uncle    Sleep apnea Maternal Uncle    Cancer Paternal Aunt    Cancer Paternal Uncle     SOCIAL HISTORY: Social History   Socioeconomic  History   Marital status: Legally Separated    Spouse name: Not on file   Number of children: 2   Years of education: Not on file   Highest education level: Associate degree: occupational, Scientist, product/process development, or vocational program  Occupational History   Not on file  Tobacco Use   Smoking status: Former    Passive exposure: Past   Smokeless tobacco: Never   Tobacco comments:    Cigarettes in high school a few times  Vaping Use   Vaping status: Never Used  Substance and Sexual Activity   Alcohol use: Yes    Comment: Wine cooler or mixed drink after work several times per week.  No more than 4 drinks at 1 time on special occasions   Drug use: No   Sexual activity: Yes    Birth control/protection: Pill    Comment: takes norethindrone  acetate for heavy menses  Other Topics Concern   Not on file  Social History Narrative   Social Hx:   Current living situation- living in [place] with husband, son is 50/50 custody with biological father, daughter lives alone. Patient's mother lives with her.    Raised in [place] by mom and dad   Siblings- 2 brothers and pt is the youngest. She is 89 yrs younger than her youngest brother   Schooling- associates degree in early childhood   Married- yes - currently in 3rd marriage   Kids- 2 from 1st marriage      Legal issues- denies      Caffeine: all day long everyday, unable to quantify    Social Drivers of Corporate investment banker Strain: Not on file  Food Insecurity: No Food Insecurity (12/17/2023)   Received from [facility]   Hunger Vital Sign    Worried About Running Out of Food in the Last Year:  Never true    Ran Out of Food in the Last Year: Never true  Transportation Needs: No Transportation Needs (12/17/2023)   Received from [facility] - Transportation    In the past 12 months, has lack of transportation kept you from medical appointments or from getting medications?: No    Lack of  Transportation (Non-Medical): No  Physical Activity: Not on file  Stress: Not on file  Social Connections: Not on file  Intimate Partner Violence: Not At Risk (11/22/2021)   [facility]   Humiliation, Afraid, Rape, and Kick questionnaire    Fear of Current or Ex-Partner: No    Emotionally Abused: No    Physically Abused: No    Sexually Abused: No     PHYSICAL EXAM  There were no vitals filed for this visit.  There is no height or weight on file to calculate BMI.  Generalized: Well developed, in no acute distress  Cardiology: normal rate and rhythm, no murmur noted Respiratory: clear to auscultation bilaterally  Neurological examination  Mentation: Alert oriented to time, place, history taking. Follows all commands speech and language fluent Cranial nerve II-XII: Pupils were equal round reactive to light. Extraocular movements were full, visual field were full  Motor: The motor testing reveals 5 over 5 strength of all 4 extremities. Good symmetric motor tone is noted throughout.  Gait and station: Gait is wide due to body habitus. Uses cane.    DIAGNOSTIC DATA (LABS, IMAGING, TESTING) - I reviewed patient records, labs, notes, testing and imaging myself where available.      No data to display           Lab Results  Component Value Date   WBC 6.8 09/22/2023   HGB 15.9 (H) 09/22/2023   HCT 48.7 (H) 09/22/2023   MCV 93.5 09/22/2023   PLT 397 09/22/2023      Component Value Date/Time   NA 138 09/22/2023 0915   K 4.6 09/22/2023 0915   CL 104 09/22/2023 0915   CO2 26 09/22/2023 0915   GLUCOSE 96 09/22/2023 0915   BUN 13 09/22/2023 0915   CREATININE 0.69 09/22/2023 0915   CALCIUM 9.7 09/22/2023 0915   PROT 7.7 09/22/2023 0915   AST 17 09/22/2023 0915   ALT 24 09/22/2023 0915   BILITOT 0.4 09/22/2023 0915   GFRNONAA >60 11/14/2009 1255   GFRAA  11/14/2009 1255    >60        The eGFR has been calculated using the MDRD  equation. This calculation has not been validated in all clinical situations. eGFR's persistently <60 mL/min signify possible Chronic Kidney Disease.   Lab Results  Component Value Date   CHOL 193 05/25/2020   HDL 42 05/25/2020   LDLCALC 100 05/25/2020   TRIG 102 05/25/2020   No results found for: "HGBA1C" No results found for: "VITAMINB12" Lab Results  Component Value Date   TSH 0.024 (L) 11/22/2021     ASSESSMENT AND PLAN 48 y.o. year old female  has a past medical history of Allergy, Anxiety, Arthritis, Asthma, Breast nodule (01/02/2015), Depression, Fatigue, GERD (gastroesophageal reflux disease), Herpes, HPV in female, IBS (irritable bowel syndrome), Long term current use of antipsychotic medication (07/20/2023), Long-term current use ofprescription benzodiazepine (07/20/2023), Obesity, OSA (obstructive sleep apnea), PMDD (premenstrual dysphoric disorder), Thyroid  disease, Trauma, Vaginal Pap smear, abnormal, and Vitamin D deficiency. here with   No diagnosis found.     Joye L  Arcia is doing well on CPAP therapy. Compliance report reveals excellent compliance. She was encouraged to continue using CPAP nightly and for greater than 4 hours each night. She was encouraged to monitor for air leak at home. Ensure mask is well fitting. We will update supply orders as indicated. I have asked her to reach out to [facility] for replacement supplies. We have had a lengthy conversation regarding fatigue and co morbidities. She was encouraged to continue advocating for endocrinologist. Consider weight management specialist. Continue close follow up with behavioral specialists and PCP. Risks of untreated sleep apnea review and education materials provided. Healthy lifestyle habits encouraged. She will follow up in 1 year, sooner if needed. She verbalizes understanding and agreement with this plan.    No orders of the defined types were placed in this encounter.    No orders of the  defined types were placed in this encounter.     Terrilyn Fick, FNP-C 03/16/2024, 10:01 AM [facility] [address] [place], [place] [address] [phone]

## 2024-03-17 ENCOUNTER — Encounter: Payer: Self-pay | Admitting: Family Medicine

## 2024-03-17 ENCOUNTER — Telehealth: Payer: Self-pay

## 2024-03-17 ENCOUNTER — Ambulatory Visit: Admitting: Family Medicine

## 2024-03-17 VITALS — BP 121/85 | HR 78 | Ht 65.0 in | Wt >= 6400 oz

## 2024-03-17 DIAGNOSIS — G4733 Obstructive sleep apnea (adult) (pediatric): Secondary | ICD-10-CM

## 2024-03-17 NOTE — Telephone Encounter (Signed)
 Called pt and asked her if she was still using [facility] for her DME, pt said she was.   Faxed CPAP Supply order to [facility] [phone]

## 2024-03-17 NOTE — Addendum Note (Signed)
 Addended by: Terrilyn Fick L on: 03/17/2024 10:48 AM   Modules accepted: Level of Service

## 2024-03-21 ENCOUNTER — Ambulatory Visit (INDEPENDENT_AMBULATORY_CARE_PROVIDER_SITE_OTHER): Admitting: Psychiatry

## 2024-03-21 DIAGNOSIS — F411 Generalized anxiety disorder: Secondary | ICD-10-CM | POA: Diagnosis not present

## 2024-03-21 DIAGNOSIS — F329 Major depressive disorder, single episode, unspecified: Secondary | ICD-10-CM

## 2024-03-21 DIAGNOSIS — F330 Major depressive disorder, recurrent, mild: Secondary | ICD-10-CM
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
## 2024-03-24 ENCOUNTER — Telehealth (HOSPITAL_COMMUNITY): Payer: Self-pay | Admitting: *Deleted

## 2024-03-24 NOTE — Telephone Encounter (Signed)
 Patient called to schedule more appt with Peggy and lmom. Called patient back to schedule appt and was not able to reach her and voicemail box is full.

## 2024-04-04 ENCOUNTER — Ambulatory Visit (INDEPENDENT_AMBULATORY_CARE_PROVIDER_SITE_OTHER): Admitting: Psychiatry

## 2024-04-04 DIAGNOSIS — F330 Major depressive disorder, recurrent, mild: Secondary | ICD-10-CM

## 2024-04-04 DIAGNOSIS — F411 Generalized anxiety disorder: Secondary | ICD-10-CM | POA: Diagnosis not present

## 2024-04-04 DIAGNOSIS — F4310 Post-traumatic stress disorder, unspecified: Secondary | ICD-10-CM
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
## 2024-04-18 ENCOUNTER — Ambulatory Visit (HOSPITAL_COMMUNITY): Admitting: Psychiatry

## 2024-04-18 DIAGNOSIS — F4310 Post-traumatic stress disorder, unspecified: Secondary | ICD-10-CM | POA: Diagnosis not present

## 2024-04-18 DIAGNOSIS — F330 Major depressive disorder, recurrent, mild: Secondary | ICD-10-CM

## 2024-04-18 DIAGNOSIS — F411 Generalized anxiety disorder: Secondary | ICD-10-CM | POA: Diagnosis not present

## 2024-04-18 NOTE — Progress Notes (Unsigned)
 SABRA
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
## 2024-05-02 ENCOUNTER — Ambulatory Visit (INDEPENDENT_AMBULATORY_CARE_PROVIDER_SITE_OTHER): Admitting: Psychiatry

## 2024-05-02 DIAGNOSIS — F330 Major depressive disorder, recurrent, mild: Secondary | ICD-10-CM

## 2024-05-02 DIAGNOSIS — F411 Generalized anxiety disorder: Secondary | ICD-10-CM
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
## 2024-05-03 ENCOUNTER — Ambulatory Visit (INDEPENDENT_AMBULATORY_CARE_PROVIDER_SITE_OTHER): Admitting: Gastroenterology

## 2024-05-03 ENCOUNTER — Encounter: Payer: Self-pay | Admitting: Gastroenterology

## 2024-05-03 VITALS — BP 143/88 | HR 73 | Temp 97.1°F | Ht 65.0 in | Wt >= 6400 oz

## 2024-05-03 DIAGNOSIS — K5900 Constipation, unspecified: Secondary | ICD-10-CM

## 2024-05-03 DIAGNOSIS — K5904 Chronic idiopathic constipation: Secondary | ICD-10-CM

## 2024-05-03 NOTE — Patient Instructions (Addendum)
 I will discuss further with anesthesia about the colonoscopy and message you back on MyChart.  Let's start Linzess 145 mcg. Linzess works best when taken once a day every day, on an empty stomach, at least 30 minutes before your first meal of the day.  When Linzess is taken daily as directed:  *Constipation relief is typically felt in about a week *IBS-C patients may begin to experience relief from belly pain and overall abdominal symptoms (pain, discomfort, and bloating) in about 1 week,   with symptoms typically improving over 12 weeks.  Diarrhea may occur in the first 2 weeks -keep taking it.  The diarrhea should go away and you should start having normal, complete, full bowel movements. It may be helpful to start treatment when you can be near the comfort of your own bathroom, such as a weekend.    We will see you back in 3-4 months! Let me know how Linzess works for you. We can adjust the dosage up or down if needed.   Check out Alan Bumpers, NP: [email or web address]   It was a pleasure to see you today. I want to create trusting relationships with patients and provide genuine, compassionate, and quality care. I truly value your feedback, so please be on the lookout for a survey regarding your visit with me today. I appreciate your time in completing this!         Therisa MICAEL Stager, PhD, ANP-BC [facility]

## 2024-05-03 NOTE — Progress Notes (Unsigned)
 Gastroenterology Office Note     Primary Care Physician:  Vick Lurie, FNP  Primary Gastroenterologist:   Chief Complaint   Chief Complaint  Patient presents with   Follow-up    Patient here here today for a follow up on Constipation and bright red blood per rectum. She is seeing some blood very rarely.     History of Present Illness   [name] is a 48 y.o. female presenting today with a history of    Possible anal fissure in past. Hx of rectal bleeding.   Stays on daily laxative (dulcolax) and 3 stool softeners (docusate sodium). Now rarely constipation. Rectal bleeding improved. BM usually daily. Every now and then a little loose stool. Rare abdominal pain.     Past Medical History:  Diagnosis Date   Allergy    Anxiety    Arthritis    Asthma    Breast nodule 01/02/2015   Depression    Fatigue    GERD (gastroesophageal reflux disease)    Herpes    HPV in female    IBS (irritable bowel syndrome)    Long term current use of antipsychotic medication 07/20/2023   Long-term current use ofprescription benzodiazepine 07/20/2023   Obesity    OSA (obstructive sleep apnea)    PMDD (premenstrual dysphoric disorder)    Thyroid  disease    hypothryoidism   Trauma    Vaginal Pap smear, abnormal    Vitamin D deficiency     Past Surgical History:  Procedure Laterality Date   CESAREAN SECTION     CHOLECYSTECTOMY     TONSILECTOMY/ADENOIDECTOMY WITH MYRINGOTOMY     WISDOM TOOTH EXTRACTION      Current Outpatient Medications  Medication Sig Dispense Refill   atorvastatin (LIPITOR) 20 MG tablet Take 20 mg by mouth daily.     betamethasone dipropionate 0.05 % cream Apply topically daily as needed (eczema).     BISACODYL LAXATIVE PO Take by mouth daily as needed (constipation). (Patient taking differently: Take by mouth daily.)     cetirizine (ZYRTEC) 10 MG tablet Take 10 mg by mouth daily.     Cholecalciferol (VITAMIN D3 PO) Take 1 tablet by mouth daily  in the afternoon.     cloNIDine  (CATAPRES ) 0.1 MG tablet Take 1 tablet (0.1 mg total) by mouth at bedtime. 90 tablet 1   Cyanocobalamin  (VITAMIN B12 PO) Take by mouth. One daily     Docusate Sodium (COLACE PO) Take 2 tablets by mouth in the morning and at bedtime. (Patient taking differently: Take by mouth in the morning and at bedtime. 2 at bedtime and 1 every am.)     lamoTRIgine  (LAMICTAL ) 100 MG tablet Take 1 tablet (100 mg total) by mouth at bedtime. 90 tablet 1   levalbuterol (XOPENEX HFA) 45 MCG/ACT inhaler Inhale into the lungs every 4 (four) hours as needed for wheezing.     levothyroxine  (SYNTHROID ) 200 MCG tablet Take 1 tablet (200 mcg total) by mouth daily. 90 tablet 1   levothyroxine  (SYNTHROID ) 25 MCG tablet Take 25 mcg by mouth daily before breakfast.     liothyronine (CYTOMEL) 5 MCG tablet Take 5 mcg by mouth 2 (two) times daily.     Misc Natural Products (CRANBERRY/PROBIOTIC PO) Take by mouth daily. Cranberry/Probiotic/Prebiotic Blend-2 daily     montelukast (SINGULAIR) 10 MG tablet Take 10 mg by mouth daily.      Multiple Vitamins-Minerals (WOMENS MULTI VITAMIN & MINERAL PO) Take by mouth daily.  NONFORMULARY OR COMPOUNDED ITEM [place] hemorrhoid cream. (Patient taking differently: as needed. [place] hemorrhoid cream.)     norethindrone  (GALLIFREY ) 5 MG tablet Take 5 mg by mouth daily. 2 5MG  ONCE DAILY (Patient taking differently: Take 10 mg by mouth daily.)     phentermine (ADIPEX-P) 37.5 MG tablet Take 37.5 mg by mouth daily before breakfast.     propranolol  (INDERAL ) 20 MG tablet Take 1 tablet (20 mg total) by mouth daily as needed (anxiety). (Patient taking differently: Take 20 mg by mouth daily at 6 (six) AM.) 90 tablet 1   SUMAtriptan (IMITREX) 50 MG tablet Take 50 mg by mouth daily as needed for migraine.     traZODone  (DESYREL ) 50 MG tablet Take 1 tablet (50 mg total) by mouth at bedtime. 90 tablet 1   triamcinolone (NASACORT) 55 MCG/ACT AERO nasal inhaler  Place 2 sprays into the nose daily. (Patient taking differently: Place 2 sprays into the nose daily. Two sprays each nare daily)     venlafaxine  XR (EFFEXOR -XR) 75 MG 24 hr capsule Take 3 capsules (225 mg total) by mouth daily with breakfast. 270 capsule 1   No current facility-administered medications for this visit.    Allergies as of 05/03/2024 - Review Complete 05/03/2024  Allergen Reaction Noted   Latex  11/01/2009   Naproxen     Shrimp [shellfish allergy] Other (See Comments) 05/12/2017    Family History  Problem Relation Age of Onset   Hypertension Mother    Arthritis Mother    Anxiety disorder Mother    Lung cancer Mother    Sleep apnea Mother    Asthma Father    COPD Father    Arthritis Father    Chronic bronchitis Father    Hypertension Father    Hyperlipidemia Father    Anxiety disorder Father    Depression Father    Alcohol abuse Father    Drug abuse Brother    Alcohol abuse Brother    Hypertension Brother    Mental illness Brother    Depression Brother    Hyperlipidemia Brother    Hearing loss Brother    Asthma Brother    Learning disabilities Brother    Asthma Maternal Grandmother    Arthritis Maternal Grandmother    Hypertension Maternal Grandmother    Congestive Heart Failure Maternal Grandmother    Diabetes Maternal Grandfather    Stroke Maternal Grandfather    ADD / ADHD Son    Cancer Maternal Aunt    Cancer Maternal Uncle    Sleep apnea Maternal Uncle    Cancer Paternal Aunt    Cancer Paternal Uncle     Social History   Socioeconomic History   Marital status: Legally Separated    Spouse name: Not on file   Number of children: 2   Years of education: Not on file   Highest education level: Associate degree: occupational, Scientist, product/process development, or vocational program  Occupational History   Not on file  Tobacco Use   Smoking status: Former    Passive exposure: Past   Smokeless tobacco: Never   Tobacco comments:    Cigarettes in high school a few  times  Vaping Use   Vaping status: Never Used  Substance and Sexual Activity   Alcohol use: Yes    Comment: Wine cooler or mixed drink after work several times per week.  No more than 4 drinks at 1 time on special occasions   Drug use: No   Sexual activity: Yes  Birth control/protection: Pill    Comment: takes norethindrone  acetate for heavy menses  Other Topics Concern   Not on file  Social History Narrative   Social Hx:   Current living situation- living in [place] with husband, son is 50/50 custody with biological father, daughter lives alone. Patient's mother lives with her.    Raised in [place] by mom and dad   Siblings- 2 brothers and pt is the youngest. She is 43 yrs younger than her youngest brother   Schooling- associates degree in early childhood   Married- yes - currently in 3rd marriage   Kids- 2 from 1st marriage      Legal issues- denies      Caffeine: all day long everyday, unable to quantify    Social Drivers of Health   Financial Resource Strain: Low Risk  (03/17/2024)   Received from [facility]   Overall Financial Resource Strain (CARDIA)    Difficulty of Paying Living Expenses: Not hard at all  Food Insecurity: No Food Insecurity (03/17/2024)   Received from [facility]   Hunger Vital Sign    Within the past 12 months, you worried that your food would run out before you got the money to buy more.: Never true    Within the past 12 months, the food you bought just didn't last and you didn't have money to get more.: Never true  Transportation Needs: No Transportation Needs (03/17/2024)   Received from [facility]    In the past 12 months, has lack of transportation kept you from medical appointments or from getting medications?: No    Lack of Transportation (Non-Medical): No  Physical Activity: Not on file  Stress: Not on file  Social Connections: Not on file  Intimate  Partner Violence: Not At Risk (11/22/2021)   Received from [facility]   Humiliation, Afraid, Rape, and Kick questionnaire    Within the last year, have you been afraid of your partner or ex-partner?: No    Within the last year, have you been humiliated or emotionally abused in other ways by your partner or ex-partner?: No    Within the last year, have you been kicked, hit, slapped, or otherwise physically hurt by your partner or ex-partner?: No    Within the last year, have you been raped or forced to have any kind of sexual activity by your partner or ex-partner?: No     Review of Systems   Gen: Denies any fever, chills, fatigue, weight loss, lack of appetite.  CV: Denies chest pain, heart palpitations, peripheral edema, syncope.  Resp: Denies shortness of breath at rest or with exertion. Denies wheezing or cough.  GI: Denies dysphagia or odynophagia. Denies jaundice, hematemesis, fecal incontinence. GU : Denies urinary burning, urinary frequency, urinary hesitancy MS: Denies joint pain, muscle weakness, cramps, or limitation of movement.  Derm: Denies rash, itching, dry skin Psych: Denies depression, anxiety, memory loss, and confusion Heme: Denies bruising, bleeding, and enlarged lymph nodes.   Physical Exam   BP (!) 143/88 (BP Location: Right Arm, Patient Position: Sitting, Cuff Size: Normal)   Pulse 73   Temp (!) 97.1 F (36.2 C) (Temporal)   Ht 5' 5 (1.651 m)   Wt (!) 535 lb 3.2 oz (242.8 kg)   BMI 89.06 kg/m  General:   Alert and oriented. Pleasant and cooperative. Well-nourished and well-developed.  Head:  Normocephalic and atraumatic. Eyes:  Without icterus Abdomen:  +  BS, soft, non-tender and non-distended. No HSM noted. No guarding or rebound. No masses appreciated.  Rectal:  Deferred  Msk:  Symmetrical without gross deformities. Normal posture. Extremities:  Without edema. Neurologic:  Alert and  oriented x4;  grossly normal neurologically. Skin:  Intact  without significant lesions or rashes. Psych:  Alert and cooperative. Normal mood and affect.   Assessment   [name] is a 48 y.o. female presenting today with a history of    PLAN    Linzess 145 mcg    Therisa MICAEL Stager, PhD, [facility]astroenterology

## 2024-05-05 ENCOUNTER — Encounter: Payer: Self-pay | Admitting: Gastroenterology

## 2024-05-05 DIAGNOSIS — Z1211 Encounter for screening for malignant neoplasm of colon: Secondary | ICD-10-CM

## 2024-05-17 NOTE — Addendum Note (Signed)
 Addended by: JEANELL GRAEME RAMAN on: 05/17/2024 11:13 AM   Modules accepted: Orders

## 2024-05-17 NOTE — Telephone Encounter (Signed)
 Please refer patient to [facility] just for screening colonoscopy. We are unable to do here due to BMI. It is not to transfer care, only for colonoscopy.  Dena: please put Linzess 72 mcg at front desk. Thanks!

## 2024-05-17 NOTE — Telephone Encounter (Signed)
 Referral placed.

## 2024-05-17 NOTE — Telephone Encounter (Signed)
 Noted  Linzess samples upfront and pt has been notified

## 2024-06-07 ENCOUNTER — Ambulatory Visit (INDEPENDENT_AMBULATORY_CARE_PROVIDER_SITE_OTHER): Admitting: Psychiatry

## 2024-06-07 DIAGNOSIS — F329 Major depressive disorder, single episode, unspecified: Secondary | ICD-10-CM | POA: Diagnosis not present

## 2024-06-07 DIAGNOSIS — F330 Major depressive disorder, recurrent, mild: Secondary | ICD-10-CM

## 2024-06-07 DIAGNOSIS — F411 Generalized anxiety disorder: Secondary | ICD-10-CM

## 2024-06-07 DIAGNOSIS — F4310 Post-traumatic stress disorder, unspecified: Secondary | ICD-10-CM
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
## 2024-06-24 ENCOUNTER — Ambulatory Visit (HOSPITAL_COMMUNITY): Admitting: Psychiatry

## 2024-06-24 DIAGNOSIS — F329 Major depressive disorder, single episode, unspecified: Secondary | ICD-10-CM

## 2024-06-24 DIAGNOSIS — F330 Major depressive disorder, recurrent, mild: Secondary | ICD-10-CM

## 2024-06-24 DIAGNOSIS — F411 Generalized anxiety disorder: Secondary | ICD-10-CM | POA: Diagnosis not present
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
## 2024-07-07 ENCOUNTER — Telehealth (HOSPITAL_COMMUNITY): Payer: Self-pay | Admitting: *Deleted

## 2024-07-07 NOTE — Telephone Encounter (Signed)
 Patient called and lmom stating she is trying to schedule several more appt with Peggy. Staff called patient to schedule appt but was not able to reach patient due to voicemail box being full.

## 2024-07-08 ENCOUNTER — Ambulatory Visit (HOSPITAL_COMMUNITY): Admitting: Psychiatry

## 2024-07-08 DIAGNOSIS — F411 Generalized anxiety disorder: Secondary | ICD-10-CM | POA: Diagnosis not present
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
## 2024-07-12 ENCOUNTER — Ambulatory Visit: Admitting: Orthopedic Surgery

## 2024-07-12 ENCOUNTER — Encounter: Payer: Self-pay | Admitting: Orthopedic Surgery

## 2024-07-12 ENCOUNTER — Other Ambulatory Visit (INDEPENDENT_AMBULATORY_CARE_PROVIDER_SITE_OTHER): Payer: Self-pay

## 2024-07-12 ENCOUNTER — Other Ambulatory Visit: Payer: Self-pay | Admitting: Orthopedic Surgery

## 2024-07-12 DIAGNOSIS — G8929 Other chronic pain: Secondary | ICD-10-CM

## 2024-07-12 DIAGNOSIS — M7052 Other bursitis of knee, left knee: Secondary | ICD-10-CM

## 2024-07-12 DIAGNOSIS — M1712 Unilateral primary osteoarthritis, left knee: Secondary | ICD-10-CM

## 2024-07-12 MED ORDER — TRAMADOL HCL 50 MG PO TABS: 50.0000 mg | ORAL_TABLET | Freq: Four times a day (QID) | ORAL | 0 refills | Status: DC | PRN

## 2024-07-12 NOTE — Patient Instructions (Signed)

## 2024-07-12 NOTE — Progress Notes (Signed)
 New Patient Visit  Assessment: [name] is a 48 y.o. female with the following: 1. Pes anserinus bursitis of left knee  Plan: Channing LITTIE Cliche has pain in the medial and anterior aspect of the left knee.  Pain is consistent with Pes anserine tendinitis.  Over-the-counter pain medications have not been effective.  This is limiting her function.  No specific injury.  She may have twisted her knee.  Given the location of the pain, I think this is most likely associated with the tendons.  It does not appear to be intra-articular.  We discussed proceeding with an injection, and she elected proceed.  Limited prescription for tramadol has been provided.  Procedure note injection Left knee - pes anserine tendons   Verbal consent was obtained to inject the left knee joint  Timeout was completed to confirm the site of injection.  The skin was prepped with alcohol and ethyl chloride was sprayed at the injection site.  A 21-gauge needle was used to inject 40 mg of Depo-Medrol  and 1% lidocaine (4 cc) into the left knee in the area of the pes anserine tendons  There were no complications. A sterile bandage was applied.     Follow-up: Return if symptoms worsen or fail to improve.  Subjective:  Chief Complaint  Patient presents with   Knee Pain    L under kneecap and runs medially for 1 wk. Has tried OTC NSAID's with no relief     History of Present Illness: [name] is a 48 y.o. female who presents for evaluation of left knee pain.  She is complaining of pain over the anterior and medial aspect the left knee.  No specific injury.  She is active on her feet, and may have twisted.  She has some bruising around the knee.  She has tried over-the-counter NSAIDs without improvement in her symptoms.   Review of Systems: No fevers or chills No numbness or tingling No chest pain No shortness of breath No bowel or bladder dysfunction No GI distress No headaches   Medical  History:  Past Medical History:  Diagnosis Date   Allergy    Anxiety    Arthritis    Asthma    Breast nodule 01/02/2015   Depression    Fatigue    GERD (gastroesophageal reflux disease)    Herpes    HPV in female    IBS (irritable bowel syndrome)    Long term current use of antipsychotic medication 07/20/2023   Long-term current use ofprescription benzodiazepine 07/20/2023   Obesity    OSA (obstructive sleep apnea)    PMDD (premenstrual dysphoric disorder)    Thyroid  disease    hypothryoidism   Trauma    Vaginal Pap smear, abnormal    Vitamin D deficiency     Past Surgical History:  Procedure Laterality Date   CESAREAN SECTION     CHOLECYSTECTOMY     TONSILECTOMY/ADENOIDECTOMY WITH MYRINGOTOMY     WISDOM TOOTH EXTRACTION      Family History  Problem Relation Age of Onset   Hypertension Mother    Arthritis Mother    Anxiety disorder Mother    Lung cancer Mother    Sleep apnea Mother    Asthma Father    COPD Father    Arthritis Father    Chronic bronchitis Father    Hypertension Father    Hyperlipidemia Father    Anxiety disorder Father    Depression Father    Alcohol abuse Father  Drug abuse Brother    Alcohol abuse Brother    Hypertension Brother    Mental illness Brother    Depression Brother    Hyperlipidemia Brother    Hearing loss Brother    Asthma Brother    Learning disabilities Brother    Asthma Maternal Grandmother    Arthritis Maternal Grandmother    Hypertension Maternal Grandmother    Congestive Heart Failure Maternal Grandmother    Diabetes Maternal Grandfather    Stroke Maternal Grandfather    ADD / ADHD Son    Cancer Maternal Aunt    Cancer Maternal Uncle    Sleep apnea Maternal Uncle    Cancer Paternal Aunt    Cancer Paternal Uncle    Social History   Tobacco Use   Smoking status: Former    Passive exposure: Past   Smokeless tobacco: Never   Tobacco comments:    Cigarettes in high school a few times  Vaping Use    Vaping status: Never Used  Substance Use Topics   Alcohol use: Yes    Comment: Wine cooler or mixed drink after work several times per week.  No more than 4 drinks at 1 time on special occasions   Drug use: No    Allergies  Allergen Reactions   Latex    Naproxen    Shrimp [Shellfish Allergy] Other (See Comments)    Intolerance     Current Meds  Medication Sig   atorvastatin (LIPITOR) 20 MG tablet Take 20 mg by mouth daily.   betamethasone dipropionate 0.05 % cream Apply topically daily as needed (eczema).   BISACODYL LAXATIVE PO Take by mouth daily as needed (constipation). (Patient taking differently: Take by mouth daily.)   cetirizine (ZYRTEC) 10 MG tablet Take 10 mg by mouth daily.   Cholecalciferol (VITAMIN D3 PO) Take 1 tablet by mouth daily in the afternoon.   cloNIDine  (CATAPRES ) 0.1 MG tablet Take 1 tablet (0.1 mg total) by mouth at bedtime.   Cyanocobalamin  (VITAMIN B12 PO) Take by mouth. One daily   Docusate Sodium (COLACE PO) Take 2 tablets by mouth in the morning and at bedtime. (Patient taking differently: Take by mouth in the morning and at bedtime. 2 at bedtime and 1 every am.)   lamoTRIgine  (LAMICTAL ) 100 MG tablet Take 1 tablet (100 mg total) by mouth at bedtime.   levalbuterol (XOPENEX HFA) 45 MCG/ACT inhaler Inhale into the lungs every 4 (four) hours as needed for wheezing.   levothyroxine  (SYNTHROID ) 200 MCG tablet Take 1 tablet (200 mcg total) by mouth daily.   levothyroxine  (SYNTHROID ) 25 MCG tablet Take 25 mcg by mouth daily before breakfast.   liothyronine (CYTOMEL) 5 MCG tablet Take 5 mcg by mouth 2 (two) times daily.   Misc Natural Products (CRANBERRY/PROBIOTIC PO) Take by mouth daily. Cranberry/Probiotic/Prebiotic Blend-2 daily   montelukast (SINGULAIR) 10 MG tablet Take 10 mg by mouth daily.    Multiple Vitamins-Minerals (WOMENS MULTI VITAMIN & MINERAL PO) Take by mouth daily.   NONFORMULARY OR COMPOUNDED ITEM [place] hemorrhoid cream. (Patient  taking differently: as needed. [place] hemorrhoid cream.)   norethindrone  (GALLIFREY ) 5 MG tablet Take 5 mg by mouth daily. 2 5MG  ONCE DAILY (Patient taking differently: Take 10 mg by mouth daily.)   phentermine (ADIPEX-P) 37.5 MG tablet Take 37.5 mg by mouth daily before breakfast.   propranolol  (INDERAL ) 20 MG tablet Take 1 tablet (20 mg total) by mouth daily as needed (anxiety). (Patient taking differently: Take 20 mg by mouth  daily at 6 (six) AM.)   SUMAtriptan (IMITREX) 50 MG tablet Take 50 mg by mouth daily as needed for migraine.   traMADol (ULTRAM) 50 MG tablet Take 1 tablet (50 mg total) by mouth every 6 (six) hours as needed.   traZODone  (DESYREL ) 50 MG tablet Take 1 tablet (50 mg total) by mouth at bedtime.   triamcinolone (NASACORT) 55 MCG/ACT AERO nasal inhaler Place 2 sprays into the nose daily. (Patient taking differently: Place 2 sprays into the nose daily. Two sprays each nare daily)   venlafaxine  XR (EFFEXOR -XR) 75 MG 24 hr capsule Take 3 capsules (225 mg total) by mouth daily with breakfast.    Objective: There were no vitals taken for this visit.  Physical Exam:  General: Alert and oriented., No acute distress., and Obese female. Gait: Ambulates with the assistance of a cane  Pain over the anterior medial aspect of the left knee.  This is distal and medial to the patella.  Tenderness without bruising.  No redness.  Rest of knee exam is limited.  IMAGING: No new imaging obtained today   New Medications:  Meds ordered this encounter  Medications   traMADol (ULTRAM) 50 MG tablet    Sig: Take 1 tablet (50 mg total) by mouth every 6 (six) hours as needed.    Dispense:  20 tablet    Refill:  0      Oneil DELENA Horde, MD  07/12/2024 9:13 AM

## 2024-07-15 ENCOUNTER — Telehealth: Payer: Self-pay | Admitting: Orthopedic Surgery

## 2024-07-15 NOTE — Telephone Encounter (Signed)
 Dr. Areatha pt - spoke w/the pt, she stated that she was here Tuesday and the injection she got is not helping and neither is the Tramadol.  She stated she has her daughter's wedding reception this weekend and the wedding next weekend.  She wants to know if there is something else you can give her so she doesn't look like she's in pain in every wedding picture. [facility] [address]

## 2024-07-15 NOTE — Telephone Encounter (Signed)
 Sorry, Dr Onesimo pt, but sh did say she saw Dr. VEAR

## 2024-07-18 MED ORDER — HYDROCODONE-ACETAMINOPHEN 5-325 MG PO TABS: 1.0000 | ORAL_TABLET | Freq: Four times a day (QID) | ORAL | 0 refills | Status: DC | PRN

## 2024-07-22 ENCOUNTER — Ambulatory Visit (HOSPITAL_COMMUNITY): Admitting: Psychiatry

## 2024-07-22 DIAGNOSIS — F329 Major depressive disorder, single episode, unspecified: Secondary | ICD-10-CM | POA: Diagnosis not present

## 2024-07-22 DIAGNOSIS — F330 Major depressive disorder, recurrent, mild: Secondary | ICD-10-CM

## 2024-07-22 DIAGNOSIS — F411 Generalized anxiety disorder: Secondary | ICD-10-CM | POA: Diagnosis not present
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
## 2024-07-28 ENCOUNTER — Encounter: Payer: Self-pay | Admitting: Gastroenterology

## 2024-08-15 ENCOUNTER — Encounter: Payer: Self-pay | Admitting: Gastroenterology

## 2024-08-15 ENCOUNTER — Ambulatory Visit (INDEPENDENT_AMBULATORY_CARE_PROVIDER_SITE_OTHER): Admitting: Psychiatry

## 2024-08-15 DIAGNOSIS — F411 Generalized anxiety disorder: Secondary | ICD-10-CM
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
## 2024-09-05 ENCOUNTER — Ambulatory Visit (INDEPENDENT_AMBULATORY_CARE_PROVIDER_SITE_OTHER): Admitting: Psychiatry

## 2024-09-05 DIAGNOSIS — F329 Major depressive disorder, single episode, unspecified: Secondary | ICD-10-CM | POA: Diagnosis not present

## 2024-09-05 DIAGNOSIS — F411 Generalized anxiety disorder: Secondary | ICD-10-CM | POA: Diagnosis not present
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
## 2024-09-19 ENCOUNTER — Ambulatory Visit (HOSPITAL_COMMUNITY): Admitting: Psychiatry

## 2024-09-19 DIAGNOSIS — F411 Generalized anxiety disorder: Secondary | ICD-10-CM
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
## 2024-10-10 ENCOUNTER — Ambulatory Visit (INDEPENDENT_AMBULATORY_CARE_PROVIDER_SITE_OTHER): Admitting: Psychiatry

## 2024-10-10 DIAGNOSIS — F329 Major depressive disorder, single episode, unspecified: Secondary | ICD-10-CM | POA: Diagnosis not present

## 2024-10-10 DIAGNOSIS — F411 Generalized anxiety disorder: Secondary | ICD-10-CM | POA: Diagnosis not present
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
## 2024-10-31 ENCOUNTER — Ambulatory Visit (HOSPITAL_COMMUNITY): Admitting: Psychiatry

## 2024-10-31 DIAGNOSIS — F329 Major depressive disorder, single episode, unspecified: Secondary | ICD-10-CM

## 2024-10-31 DIAGNOSIS — F330 Major depressive disorder, recurrent, mild: Secondary | ICD-10-CM

## 2024-10-31 DIAGNOSIS — F411 Generalized anxiety disorder: Secondary | ICD-10-CM | POA: Diagnosis not present
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
## 2024-11-08 ENCOUNTER — Ambulatory Visit

## 2024-11-08 DIAGNOSIS — K580 Irritable bowel syndrome with diarrhea: Secondary | ICD-10-CM | POA: Diagnosis not present

## 2024-11-08 DIAGNOSIS — L309 Dermatitis, unspecified: Secondary | ICD-10-CM

## 2024-11-08 DIAGNOSIS — E785 Hyperlipidemia, unspecified: Secondary | ICD-10-CM

## 2024-11-08 DIAGNOSIS — J4520 Mild intermittent asthma, uncomplicated: Secondary | ICD-10-CM

## 2024-11-08 DIAGNOSIS — N951 Menopausal and female climacteric states: Secondary | ICD-10-CM | POA: Diagnosis not present

## 2024-11-08 DIAGNOSIS — M25561 Pain in right knee: Secondary | ICD-10-CM | POA: Diagnosis not present

## 2024-11-08 DIAGNOSIS — R5382 Chronic fatigue, unspecified: Secondary | ICD-10-CM

## 2024-11-08 DIAGNOSIS — G43109 Migraine with aura, not intractable, without status migrainosus: Secondary | ICD-10-CM

## 2024-11-08 DIAGNOSIS — K219 Gastro-esophageal reflux disease without esophagitis: Secondary | ICD-10-CM

## 2024-11-08 DIAGNOSIS — M249 Joint derangement, unspecified: Secondary | ICD-10-CM | POA: Diagnosis not present

## 2024-11-08 DIAGNOSIS — E063 Autoimmune thyroiditis: Secondary | ICD-10-CM | POA: Diagnosis not present

## 2024-11-08 DIAGNOSIS — F909 Attention-deficit hyperactivity disorder, unspecified type: Secondary | ICD-10-CM | POA: Diagnosis not present

## 2024-11-08 DIAGNOSIS — G8929 Other chronic pain: Secondary | ICD-10-CM | POA: Insufficient documentation

## 2024-11-08 DIAGNOSIS — G43009 Migraine without aura, not intractable, without status migrainosus: Secondary | ICD-10-CM | POA: Insufficient documentation

## 2024-11-08 MED ORDER — ALBUTEROL SULFATE HFA 108 (90 BASE) MCG/ACT IN AERS: 2.0000 | INHALATION_SPRAY | Freq: Four times a day (QID) | RESPIRATORY_TRACT | 2 refills | Status: AC | PRN

## 2024-11-08 MED ORDER — ATORVASTATIN CALCIUM 20 MG PO TABS: 20.0000 mg | ORAL_TABLET | Freq: Every day | ORAL | 1 refills | Status: AC

## 2024-11-08 MED ORDER — MONTELUKAST SODIUM 10 MG PO TABS: 10.0000 mg | ORAL_TABLET | Freq: Every day | ORAL | 1 refills | Status: AC

## 2024-11-08 MED ORDER — SUMATRIPTAN SUCCINATE 50 MG PO TABS: 50.0000 mg | ORAL_TABLET | Freq: Every day | ORAL | 2 refills | Status: AC | PRN

## 2024-11-08 MED ORDER — BETAMETHASONE DIPROPIONATE 0.05 % EX CREA: TOPICAL_CREAM | Freq: Every day | CUTANEOUS | 1 refills | Status: AC | PRN

## 2024-11-08 NOTE — Progress Notes (Signed)
 "  New Patient Office Visit  Subjective    Patient ID: [name], female    DOB: 01-Apr-1976  Age: 49 y.o. MRN: [id number]  HPI [name] presents to establish care.  Discussed the use of AI scribe software for clinical note transcription with the patient, who gave verbal consent to proceed.  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 She has a history of joint pain, easy bruising, hyperextension of knees, and frequent joint dislocations, particularly in her hips and knees. 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 Her knee pain ranges from 5 to 7 regularly, escalating to 9 or 10 occasionally, severely limiting her daily activities.  Despite her thyroid  levels being within normal range, she continues to experience significant fatigue and pain. She was previously on phentermine for chronic fatigue.  Was initially on the medication due to weight loss, but stayed on the medication due to chronic fatigue.  Had previous cardiac workup before starting the medication. Did not have any results on phentermine as far as weight loss.  Says that insurance will not cover other weight loss medications.  Would like to see if we can get this covered for her.  She reports gastrointestinal issues, including chronic constipation, nausea, and abdominal pain, which she suspects may be related to her suspected EDS. She experiences pain in various abdominal locations, often accompanied by extreme gassiness and nausea.  Reports that abdominal pain is mainly in the right upper quadrant and extends into her lower back.  Has had her gallbladder removed  in the past.  She has a history of GERD and has been on PPIs without relief. She is also in perimenopause, which she feels exacerbates her symptoms.  Currently sees a gastroenterologist, but has not been in a long time.  She has a history of migraines, which have recently increased in frequency. She experiences eye strain and blurry vision preceding her migraines. Her vision was checked a year ago and was reported as 20/20, but she continues to experience visual difficulties, particularly with reading road signs and prolonged screen use.  Her current medications include montelukast  for allergies, atorvastatin , albuterol  inhaler, sumatriptan  for migraines, and a topical medication for eczema. She has tried various treatments for her pain, including aquatic therapy, which was beneficial, but is not currently accessible due to cost and availability. She has also explored CBD edibles for pain management.  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 She lives with her best friend and her friend's mother, who is on dialysis, and she shares responsibilities for meal preparation. She follows a reduced-carb diet similar to a diabetic diet, which she finds manageable. Has met with a nutritionist in the past, and says that she does not do well with restrictive diets. Reports a past history with anorexia and bulimia, and says that restrictive dieting triggers this. Is unable to do a lot of physical exercise due to limitations related to chronic pain.  Has a history of morbid obesity.  Reports that she is not interested in bariatric surgery, due to the effects of the surgery she has witnessed with friends that  have had it.  Also concerned how bariatric surgery would affect her autoimmune disease.  Reports that she would not have good social support as far as recovery from major injury.  States that she currently lives with her  best friend, who also takes care of her of her mother that is on dialysis 3 days/week.  Says that she would not be able to provide help for her post-surgery.    Past Medical History:  Diagnosis Date   Allergy    Anxiety    Arthritis    Asthma    Breast nodule 01/02/2015   Depression    Fatigue    GERD (gastroesophageal reflux disease)    Herpes    HPV in female    IBS (irritable bowel syndrome)    Long term current use of antipsychotic medication 07/20/2023   Long-term current use ofprescription benzodiazepine 07/20/2023   Obesity    OSA (obstructive sleep apnea)    PMDD (premenstrual dysphoric disorder)    Thyroid  disease    hypothryoidism   Trauma    Vaginal Pap smear, abnormal    Vitamin D deficiency     Past Surgical History:  Procedure Laterality Date   CESAREAN SECTION     CHOLECYSTECTOMY     TONSILECTOMY/ADENOIDECTOMY WITH MYRINGOTOMY     WISDOM TOOTH EXTRACTION      Family History  Problem Relation Age of Onset   Hypertension Mother    Arthritis Mother    Anxiety disorder Mother    Lung cancer Mother    Sleep apnea Mother    Asthma Father    COPD Father    Arthritis Father    Chronic bronchitis Father    Hypertension Father    Hyperlipidemia Father    Anxiety disorder Father    Depression Father    Alcohol abuse Father    Drug abuse Brother    Alcohol abuse Brother    Hypertension Brother    Mental illness Brother    Depression Brother    Hyperlipidemia Brother    Hearing loss Brother    Asthma Brother    Learning disabilities Brother    Asthma Maternal Grandmother    Arthritis Maternal Grandmother    Hypertension Maternal Grandmother    Congestive Heart Failure Maternal Grandmother    Diabetes Maternal Grandfather    Stroke Maternal Grandfather    ADD / ADHD Son    Cancer Maternal Aunt    Cancer Maternal Uncle    Sleep apnea Maternal Uncle    Cancer Paternal Aunt    Cancer Paternal Uncle     Social History   Socioeconomic History    Marital status: Legally Separated    Spouse name: Not on file   Number of children: 2   Years of education: Not on file   Highest education level: Associate degree: occupational, scientist, product/process development, or vocational program  Occupational History   Not on file  Tobacco Use   Smoking status: Former    Passive exposure: Past   Smokeless tobacco: Never   Tobacco comments:    Cigarettes in high school a few times  Vaping Use   Vaping status: Never Used  Substance and Sexual Activity   Alcohol use: Yes    Comment: Wine cooler or mixed drink after work several times per week.  No more than 4 drinks at 1 time on special occasions   Drug use: No   Sexual activity: Yes    Birth control/protection: Pill    Comment: takes  norethindrone  acetate for heavy menses  Other Topics Concern   Not on file  Social History Narrative   Social Hx:   Current living situation- living in [place] with husband, son is 50/50 custody with biological father, daughter lives alone. Patient's mother lives with her.    Raised in [place] by mom and dad   Siblings- 2 brothers and pt is the youngest. She is 66 yrs younger than her youngest brother   Schooling- associates degree in early childhood   Married- yes - currently in 3rd marriage   Kids- 2 from 1st marriage      Legal issues- denies      Caffeine: all day long everyday, unable to quantify    Social Drivers of Health   Tobacco Use: Low Risk  (10/04/2024)   Received from [facility]   Patient History    Smoking Tobacco Use: Never    Smokeless Tobacco Use: Never    Passive Exposure: Not on file  Recent Concern: Tobacco Use - Medium Risk (07/12/2024)   Patient History    Smoking Tobacco Use: Former    Smokeless Tobacco Use: Never    Passive Exposure: Past  Physicist, Medical Strain: Low Risk  (03/17/2024)   Received from [facility]   Overall Financial Resource Strain (CARDIA)    Difficulty of Paying Living Expenses:  Not hard at all  Food Insecurity: Food Insecurity Present (08/18/2024)   Received from [facility]   Epic    Within the past 12 months, you worried that your food would run out before you got the money to buy more.: Sometimes true    Within the past 12 months, the food you bought just didn't last and you didn't have money to get more.: Never true  Transportation Needs: No Transportation Needs (08/18/2024)   Received from [facility]   PRAPARE - Transportation    Lack of Transportation (Medical): No    Lack of Transportation (Non-Medical): No  Physical Activity: Inactive (08/18/2024)   Received from [facility]   Exercise Vital Sign    On average, how many days per week do you engage in moderate to strenuous exercise (like a brisk walk)?: 0 days    On average, how many minutes do you engage in exercise at this level?: 0 min  Stress: Stress Concern Present (08/18/2024)   Received from [facility] - Occupational Stress Questionnaire    Do you feel stress - tense, restless, nervous, or anxious, or unable to sleep at night because your mind is troubled all the time - these days?: Very much  Social Connections: Not on file  Intimate Partner Violence: Not At Risk (11/22/2021)   Received from [facility]   Epic    Within the last year, have you been afraid of your partner or ex-partner?: No    Within the last year, have you been humiliated or emotionally abused in other ways by your partner or ex-partner?: No    Within the last year, have you been kicked, hit, slapped, or otherwise physically hurt by your partner or ex-partner?: No    Within the last year, have you been raped or forced to have any kind of sexual activity by your partner or ex-partner?: No  Depression (PHQ2-9): High Risk (11/08/2024)   Depression (PHQ2-9)    PHQ-2 Score: 11  Alcohol Screen: Not on file  Housing: Unknown (12/17/2023)   Received from  [facility]   Epic    Unable to Pay for Housing in the Last Year: Not on file    Number of Times Moved in the Last Year: Not on file    At any time in the past 12 months, were you homeless or living in a shelter (including now)?: No  Utilities: Low Risk (08/18/2024)   Received from [facility]   Utilities    Within the past 12 months, have you been unable to get utilities(heat, electricity) when it was really needed?: No  Health Literacy: Low Risk (08/18/2024)   Received from [facility]    How often do you need to have someone help you when you read instructions, pamphlets, or other written material from your doctor or pharmacy?: Never    Review of Systems  Constitutional:  Positive for fatigue. Negative for activity change, appetite change and fever.  Eyes:  Positive for visual disturbance.  Respiratory:  Negative for cough, chest tightness, shortness of breath and wheezing.   Cardiovascular:  Negative for chest pain.  Gastrointestinal:  Positive for abdominal pain, constipation and nausea. Negative for diarrhea and vomiting.  Genitourinary:  Negative for difficulty urinating.  Musculoskeletal:        Positive for bilateral knee pain.   Neurological:  Positive for headaches. Negative for dizziness, light-headedness and numbness.   Objective    Today's Vitals   11/08/24 1344 11/08/24 1400  BP: (!) 157/96 138/84  Pulse: 75   Temp: 98.6 F (37 C)   SpO2: 99%   Weight: (!) 535 lb (242.7 kg)   Height: 5' 5 (1.651 m)    Body mass index is 89.03 kg/m.  Physical Exam Vitals and nursing note reviewed.  Constitutional:      General: She is not in acute distress.    Appearance: Normal appearance. She is obese. She is not ill-appearing or toxic-appearing.  Cardiovascular:     Rate and Rhythm: Normal rate and regular rhythm.     Heart sounds: Normal heart sounds, S1 normal and S2 normal. No murmur heard. Pulmonary:     Effort: Pulmonary effort is normal. No  respiratory distress.     Breath sounds: Normal breath sounds. No wheezing.  Abdominal:     General: There is no distension.     Palpations: Abdomen is soft.     Tenderness: There is abdominal tenderness in the right upper quadrant. There is no guarding.     Comments: No obvious masses or abnormalities palpated on exam.  Neurological:     Mental Status: She is alert.  Psychiatric:        Mood and Affect: Mood normal.        Behavior: Behavior normal.        Thought Content: Thought content normal.        Judgment: Judgment normal.       11/08/2024    1:39 PM 04/18/2024   11:21 AM 07/20/2023    2:10 PM 02/12/2023   11:09 AM 11/27/2022    8:33 AM  Depression screen PHQ 2/9  Decreased Interest 1      Down, Depressed, Hopeless 1      PHQ - 2 Score 2      Altered sleeping 1      Tired, decreased energy 3      Change in appetite 1      Feeling bad or failure about yourself  2  Trouble concentrating 0      Moving slowly or fidgety/restless 1      Suicidal thoughts 1      PHQ-9 Score 11      Difficult doing work/chores Very difficult         Information is confidential and restricted. Go to Review Flowsheets to unlock data.      11/08/2024    1:39 PM 04/18/2024   11:25 AM 05/07/2023   11:18 AM 04/23/2023   11:15 AM  GAD 7 : Generalized Anxiety Score  Nervous, Anxious, on Edge 2     Control/stop worrying 3     Worry too much - different things 2     Trouble relaxing 1     Restless 0     Easily annoyed or irritable 2     Afraid - awful might happen 1     Total GAD 7 Score 11     Anxiety Difficulty Somewhat difficult        Information is confidential and restricted. Go to Review Flowsheets to unlock data.   Assessment & Plan:  1. Morbid obesity (HCC) (Primary) - Contributing to chronic fatigue and joint pain. Prefers non-surgical options due to past experiences and personal beliefs. Had lengthy discussion with patient about risks with morbid obesity. Based on the patient's  current weight and comorbidity, there is concern that lifestyle modification and/or pharmacologic therapy may not result in sustained weight loss. Bariatric surgery was discussed as the most effective long-term treatment option for significant weight reduction. Patient verbalized understanding. Will continue to provide support, counseling, and reassessment of options at future visits.  - Advised to be referred to weight loss clinic. Patient deferred at this time due to not being able to get medication coverage from them, and has already seen a nutritionist.  - Discussed non-weight bearing exercises such as chair yoga. - Encouraged dietary modifications with reduced carbohydrate intake. - Comprehensive metabolic panel with GFR  2. Chronic fatigue - Potentially related to Hashimoto's thyroiditis and other underlying conditions. ADHD may contribute to fatigue. - Ordered CBC. - Will consider ADHD treatment with Vyvanse after lab results. - CBC with Differential/Platelet  3. Hyperlipidemia, unspecified hyperlipidemia type - Refilled atorvastatin  prescription. - Lipid panel  4. Hypothyroidism, acquired, autoimmune - Well-managed thyroid  levels. Symptoms of chronic fatigue and joint pain may be related.  Had thyroid  tests in December 2025. - Patient to continue following up with endocrinology.  5. Irritable bowel syndrome with diarrhea - Chronic constipation, nausea, and abdominal pain with possible IBS.  - Referred to gastroenterology for further evaluation and management. - Encouraged follow-up with gastroenterology for colonoscopy and endoscopy.  6. Mild intermittent asthma without complication - Managed with albuterol  inhaler. Asthma stable with current regimen.  - Continue albuterol  inhaler as needed.  7. Chronic pain of both knees - Exacerbated by weight. Pain management limited by constipation and gastrointestinal issues. CBD edibles being considered for pain management. 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 - Encouraged non-weight bearing exercises such as chair yoga.  8. Gastroesophageal reflux disease without esophagitis - GERD symptoms persist despite PPI treatment. Possible differential includes hiatal hernia or other gastrointestinal issues. - Referred to gastroenterology for further evaluation.  9. Migraine with aura and without status migrainosus, not intractable - Increased frequency possibly related to eye strain and vision changes. - Referred to ophthalmology for vision evaluation. - Encouraged keeping a headache diary.  10. Perimenopause - Symptoms consistent with perimenopause, including  fatigue and gastrointestinal issues. Symptoms may overlap with other conditions such as Hashimoto's thyroiditis and ADHD. - Continue to monitor symptoms and consider further evaluation if needed.  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 - Referred to sports medicine specialist for evaluation and potential diagnosis. - Ambulatory referral to Sports Medicine   12. Adult ADHD - Symptoms present since childhood, with recent exacerbation possibly related to perimenopause. - Ordered baseline labs to assess cardiac risk. - Will consider ADHD treatment with Vyvanse after lab results.  13. Eczema - Continue current eczema treatment. Sent prescription for topical steroid.  - Advised patient against continuous use due to side effects, and only use as needed.   Meds ordered this encounter  Medications   atorvastatin  (LIPITOR) 20 MG tablet    Sig: Take 1 tablet (20 mg total) by mouth daily.    Dispense:  90 tablet    Refill:  1   betamethasone  dipropionate 0.05 % cream    Sig: Apply topically daily as needed (eczema).    Dispense:  30 g    Refill:  1   montelukast  (SINGULAIR ) 10 MG tablet    Sig: Take 1  tablet (10 mg total) by mouth daily.    Dispense:  90 tablet    Refill:  1   SUMAtriptan  (IMITREX ) 50 MG tablet    Sig: Take 1 tablet (50 mg total) by mouth daily as needed for migraine.    Dispense:  10 tablet    Refill:  2   albuterol  (VENTOLIN  HFA) 108 (90 Base) MCG/ACT inhaler    Sig: Inhale 2 puffs into the lungs every 6 (six) hours as needed for wheezing or shortness of breath.    Dispense:  8 g    Refill:  2   I personally spent a total of 81 minutes in the care of the patient today including preparing to see the patient, getting/reviewing separately obtained history, performing a medically appropriate exam/evaluation, counseling and educating, placing orders, referring and communicating with other health care professionals, documenting clinical information in the EHR, and coordinating care.  Return in about 3 months (around 02/06/2025).   Damien KATHEE Pringle, FNP  Note:  This document was prepared using Dragon voice recognition software and may include unintentional dictation errors.   "

## 2024-11-29 ENCOUNTER — Ambulatory Visit: Admitting: Family Medicine

## 2024-12-16 ENCOUNTER — Ambulatory Visit (HOSPITAL_COMMUNITY): Admitting: Psychiatry

## 2024-12-30 ENCOUNTER — Ambulatory Visit (HOSPITAL_COMMUNITY): Admitting: Psychiatry

## 2025-01-13 ENCOUNTER — Ambulatory Visit (HOSPITAL_COMMUNITY): Admitting: Psychiatry

## 2025-02-06 ENCOUNTER — Ambulatory Visit

## 2025-02-07 ENCOUNTER — Ambulatory Visit

## 2025-03-16 ENCOUNTER — Ambulatory Visit: Admitting: Family Medicine

## 2025-03-21 ENCOUNTER — Ambulatory Visit: Admitting: Neurology
